# Patient Record
Sex: Female | Born: 1975
Health system: Southern US, Community
[De-identification: ages and names within clinical notes are randomized; demographics above are authoritative.]

## PROBLEM LIST (undated history)

## (undated) DIAGNOSIS — K219 Gastro-esophageal reflux disease without esophagitis: Secondary | ICD-10-CM

## (undated) DIAGNOSIS — F32A Depression, unspecified: Secondary | ICD-10-CM

## (undated) DIAGNOSIS — F419 Anxiety disorder, unspecified: Secondary | ICD-10-CM

## (undated) DIAGNOSIS — I10 Essential (primary) hypertension: Secondary | ICD-10-CM

## (undated) DIAGNOSIS — D509 Iron deficiency anemia, unspecified: Secondary | ICD-10-CM

## (undated) DIAGNOSIS — R011 Cardiac murmur, unspecified: Secondary | ICD-10-CM

## (undated) DIAGNOSIS — T7840XA Allergy, unspecified, initial encounter: Secondary | ICD-10-CM

## (undated) DIAGNOSIS — F329 Major depressive disorder, single episode, unspecified: Secondary | ICD-10-CM

## (undated) HISTORY — DX: Allergy, unspecified, initial encounter: T78.40XA

## (undated) HISTORY — DX: Iron deficiency anemia, unspecified: D50.9

## (undated) HISTORY — DX: Anxiety disorder, unspecified: F41.9

## (undated) HISTORY — PX: TUBAL LIGATION: SHX77

## (undated) HISTORY — DX: Major depressive disorder, single episode, unspecified: F32.9

## (undated) HISTORY — DX: Depression, unspecified: F32.A

## (undated) HISTORY — DX: Gastro-esophageal reflux disease without esophagitis: K21.9

## (undated) HISTORY — DX: Cardiac murmur, unspecified: R01.1

## (undated) HISTORY — DX: Essential (primary) hypertension: I10

---
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
## 2000-11-11 DIAGNOSIS — O1495 Unspecified pre-eclampsia, complicating the puerperium: Secondary | ICD-10-CM

## 2003-11-12 HISTORY — PX: CHOLECYSTECTOMY: SHX55

## 2005-02-27 ENCOUNTER — Ambulatory Visit: Payer: Self-pay | Admitting: Obstetrics and Gynecology

## 2005-07-11 ENCOUNTER — Ambulatory Visit: Payer: Self-pay | Admitting: Obstetrics and Gynecology

## 2005-08-01 ENCOUNTER — Ambulatory Visit: Payer: Self-pay | Admitting: Obstetrics and Gynecology

## 2005-09-11 ENCOUNTER — Ambulatory Visit: Payer: Self-pay | Admitting: Obstetrics and Gynecology

## 2005-11-14 ENCOUNTER — Ambulatory Visit: Payer: Self-pay | Admitting: Obstetrics and Gynecology

## 2005-11-20 ENCOUNTER — Ambulatory Visit: Payer: Self-pay | Admitting: Obstetrics and Gynecology

## 2005-11-26 ENCOUNTER — Ambulatory Visit: Payer: Self-pay | Admitting: Obstetrics and Gynecology

## 2005-12-20 ENCOUNTER — Observation Stay: Payer: Self-pay | Admitting: Obstetrics and Gynecology

## 2005-12-24 ENCOUNTER — Observation Stay: Payer: Self-pay | Admitting: Obstetrics and Gynecology

## 2005-12-31 ENCOUNTER — Observation Stay: Payer: Self-pay | Admitting: Obstetrics and Gynecology

## 2006-01-08 ENCOUNTER — Ambulatory Visit: Payer: Self-pay | Admitting: Obstetrics and Gynecology

## 2006-01-10 ENCOUNTER — Inpatient Hospital Stay: Payer: Self-pay | Admitting: Obstetrics and Gynecology

## 2006-01-23 ENCOUNTER — Inpatient Hospital Stay: Payer: Self-pay | Admitting: Obstetrics and Gynecology

## 2008-06-11 LAB — CONVERTED CEMR LAB: Pap Smear: NORMAL

## 2009-02-16 ENCOUNTER — Ambulatory Visit: Payer: Self-pay | Admitting: Family Medicine

## 2009-02-16 DIAGNOSIS — I10 Essential (primary) hypertension: Secondary | ICD-10-CM | POA: Insufficient documentation

## 2009-02-16 LAB — CONVERTED CEMR LAB
Bilirubin Urine: NEGATIVE
Epithelial cells, urine: 0 /lpf
RBC / HPF: 0
Specific Gravity, Urine: >=1.03
WBC, UA: 0 cells/hpf

## 2009-02-17 ENCOUNTER — Ambulatory Visit: Payer: Self-pay | Admitting: Family Medicine

## 2009-02-17 LAB — CONVERTED CEMR LAB
AST: 22 units/L (ref 0–37)
BUN: 12 mg/dL (ref 6–23)
Basophils Absolute: 0 10*3/uL (ref 0.0–0.1)
Calcium: 8.5 mg/dL (ref 8.4–10.5)
Cholesterol: 104 mg/dL (ref 0–200)
Creatinine, Ser: 0.7 mg/dL (ref 0.4–1.2)
GFR calc non Af Amer: 101.31 mL/min (ref >60)
Glucose, Bld: 93 mg/dL (ref 70–99)
HCT: 35.2 % — ABNORMAL LOW (ref 36.0–46.0)
HDL: 28.6 mg/dL — ABNORMAL LOW (ref >39.00)
Lymphs Abs: 1.9 10*3/uL (ref 0.7–4.0)
Monocytes Relative: 13.1 % — ABNORMAL HIGH (ref 3.0–12.0)
Neutrophils Relative %: 51.4 % (ref 43.0–77.0)
Platelets: 171 10*3/uL (ref 150.0–400.0)
Potassium: 3.8 meq/L (ref 3.5–5.1)
RDW: 19.1 % — ABNORMAL HIGH (ref 11.5–14.6)
TSH: 1.71 microintl units/mL (ref 0.35–5.50)
Total Bilirubin: 0.5 mg/dL (ref 0.3–1.2)
Triglycerides: 51 mg/dL (ref 0.0–149.0)
VLDL: 10.2 mg/dL (ref 0.0–40.0)

## 2009-02-20 ENCOUNTER — Telehealth: Payer: Self-pay | Admitting: Family Medicine

## 2009-03-10 ENCOUNTER — Ambulatory Visit: Payer: Self-pay | Admitting: Family Medicine

## 2009-03-10 DIAGNOSIS — D509 Iron deficiency anemia, unspecified: Secondary | ICD-10-CM | POA: Insufficient documentation

## 2009-08-14 ENCOUNTER — Telehealth: Payer: Self-pay | Admitting: Family Medicine

## 2010-01-08 ENCOUNTER — Ambulatory Visit: Payer: Self-pay | Admitting: Family Medicine

## 2010-10-06 ENCOUNTER — Ambulatory Visit: Payer: Self-pay

## 2010-10-06 ENCOUNTER — Emergency Department: Payer: Self-pay | Admitting: Emergency Medicine

## 2010-12-11 NOTE — Assessment & Plan Note (Signed)
Summary: ELEVATED BP/DLO   Vital Signs:  Patient profile:   35 year old female Height:      64 inches Weight:      144.2 pounds BMI:     24.84 Temp:     98.5 degrees F oral Pulse rate:   88 / minute Pulse rhythm:   regular BP sitting:   140 / 96  (left arm) Cuff size:   regular  Vitals Entered By: Zenda Alpers CMA Deborra Medina) (January 08, 2010 9:58 AM)  History of Present Illness: Chief complaint elevated b/p  Hypertension History:      She denies headache, chest pain, palpitations, peripheral edema, neurologic problems, syncope, and side effects from treatment.  HAs noted increase in BP over past few weeks 130/90s. Seen at urgent care for strep...150/102 Working fulltime..increase in stress.        Positive major cardiovascular risk factors include hypertension.  Negative major cardiovascular risk factors include female age less than 67 years old and non-tobacco-user status.     Problems Prior to Update: 1)  Anemia-iron Deficiency  (ICD-280.9) 2)  Hypertension  (ICD-401.9)  Current Medications (verified): 1)  Multivitamins   Tabs (Multiple Vitamin) .... Take 1 Tablet By Mouth Once A Day 2)  Lisinopril 40 Mg Tabs (Lisinopril) .... Take 1 Tablet By Mouth Once A Day  Allergies: 1)  ! Sulfa 2)  ! * Stadol  Past History:  Past medical, surgical, family and social histories (including risk factors) reviewed, and no changes noted (except as noted below).  Past Medical History: Reviewed history from 03/10/2009 and no changes required. Hypertension Anemia-iron deficiency  Past Surgical History: Reviewed history from 02/16/2009 and no changes required. Caesarean section Cholecystectomy Tonsillectomy  Family History: Reviewed history from 02/16/2009 and no changes required. father: glioblastoma multiforme, HTN, CAD s/p stent age 18 mother: HTN, depression siblings: healthy MGM: HTN MGF: accident young age PGF: DM, HTN PGM: CVA  Social History: Reviewed history  from 02/16/2009 and no changes required. Occupation: L and D nurse at [employer]  2 children: healthy Married Never Smoked Alcohol use-no Drug use-no Regular exercise-yes, runner, 5 days a week Diet: trying to improve diet, fruits and veggies  Review of Systems General:  Denies fatigue and fever. GI:  Denies abdominal pain. Neuro:  Denies falling down, headaches, numbness, tremors, and weakness.  Physical Exam  General:  Well-developed,well-nourished,in no acute distress; alert,appropriate and cooperative throughout examination Mouth:  Oral mucosa and oropharynx without lesions or exudates.  Teeth in good repair. Neck:  no carotid bruit or thyromegaly no cervical or supraclavicular lymphadenopathy  Lungs:  Normal respiratory effort, chest expands symmetrically. Lungs are clear to auscultation, no crackles or wheezes. Heart:  Normal rate and regular rhythm. S1 and S2 normal without gallop, murmur, click, rub or other extra sounds. Pulses:  R and L posterior tibial pulses are full and equal bilaterally  Extremities:  no edema   Impression & Recommendations:  Problem # 1:  HYPERTENSION (ICD-401.9) Encouraged continued exercise, weight loss, healthy eating habits. Increase lisinopril...BMET 7-10 days after increasing medication. Her updated medication list for this problem includes:    Lisinopril 40 Mg Tabs (Lisinopril) .Marland Kitchen... Take 1 tablet by mouth once a day  Complete Medication List: 1)  Multivitamins Tabs (Multiple vitamin) .... Take 1 tablet by mouth once a day 2)  Lisinopril 40 Mg Tabs (Lisinopril) .... Take 1 tablet by mouth once a day  Hypertension Assessment/Plan:      The patient's hypertensive risk group is category A:  No risk factors and no target organ damage.  Her calculated 10 year risk of coronary heart disease is 3 %.  Today's blood pressure is 140/96.  Her blood pressure goal is < 140/90.  Patient Instructions: 1)  Follow BP at home call if greater than  140/90. Prescriptions: LISINOPRIL 40 MG TABS (LISINOPRIL) Take 1 tablet by mouth once a day  #30 x 11   Entered and Authorized by:   Eliezer Lofts MD   Signed by:   Eliezer Lofts MD on 01/08/2010   Method used:   Electronically to        [facility]       [place], [place]  [address]       Ph: [phone]       Fax: [phone]   RxID:   [id number]   Current Allergies (reviewed today): ! SULFA ! * STADOL

## 2010-12-26 ENCOUNTER — Emergency Department: Payer: Self-pay | Admitting: Unknown Physician Specialty

## 2011-03-05 ENCOUNTER — Encounter: Payer: Self-pay | Admitting: Sports Medicine

## 2011-03-12 ENCOUNTER — Encounter: Payer: Self-pay | Admitting: Sports Medicine

## 2011-05-27 ENCOUNTER — Ambulatory Visit: Payer: Self-pay | Admitting: Sports Medicine

## 2012-02-04 ENCOUNTER — Encounter: Payer: Self-pay | Admitting: Sports Medicine

## 2012-02-10 ENCOUNTER — Encounter: Payer: Self-pay | Admitting: Sports Medicine

## 2012-03-11 ENCOUNTER — Encounter: Payer: Self-pay | Admitting: Sports Medicine

## 2013-03-12 ENCOUNTER — Ambulatory Visit: Payer: Self-pay | Admitting: Internal Medicine

## 2013-05-18 ENCOUNTER — Ambulatory Visit (INDEPENDENT_AMBULATORY_CARE_PROVIDER_SITE_OTHER): Payer: 59 | Admitting: Internal Medicine

## 2013-05-18 ENCOUNTER — Encounter: Payer: Self-pay | Admitting: Internal Medicine

## 2013-05-18 VITALS — BP 150/80 | HR 75 | Temp 98.6°F | Resp 16 | Ht 64.0 in | Wt 133.5 lb

## 2013-05-18 DIAGNOSIS — R011 Cardiac murmur, unspecified: Secondary | ICD-10-CM

## 2013-05-18 DIAGNOSIS — I10 Essential (primary) hypertension: Secondary | ICD-10-CM

## 2013-05-18 DIAGNOSIS — N921 Excessive and frequent menstruation with irregular cycle: Secondary | ICD-10-CM | POA: Insufficient documentation

## 2013-05-18 DIAGNOSIS — R5381 Other malaise: Secondary | ICD-10-CM

## 2013-05-18 DIAGNOSIS — M25569 Pain in unspecified knee: Secondary | ICD-10-CM

## 2013-05-18 DIAGNOSIS — N920 Excessive and frequent menstruation with regular cycle: Secondary | ICD-10-CM

## 2013-05-18 DIAGNOSIS — D649 Anemia, unspecified: Secondary | ICD-10-CM

## 2013-05-18 DIAGNOSIS — F4310 Post-traumatic stress disorder, unspecified: Secondary | ICD-10-CM | POA: Insufficient documentation

## 2013-05-18 DIAGNOSIS — R0989 Other specified symptoms and signs involving the circulatory and respiratory systems: Secondary | ICD-10-CM

## 2013-05-18 DIAGNOSIS — G4710 Hypersomnia, unspecified: Secondary | ICD-10-CM

## 2013-05-18 DIAGNOSIS — R0600 Dyspnea, unspecified: Secondary | ICD-10-CM | POA: Insufficient documentation

## 2013-05-18 DIAGNOSIS — R5383 Other fatigue: Secondary | ICD-10-CM | POA: Insufficient documentation

## 2013-05-18 DIAGNOSIS — R0609 Other forms of dyspnea: Secondary | ICD-10-CM

## 2013-05-18 LAB — TSH: TSH: 1.38 u[IU]/mL (ref 0.35–5.50)

## 2013-05-18 LAB — CBC WITH DIFFERENTIAL/PLATELET
Basophils Absolute: 0 10*3/uL (ref 0.0–0.1)
Basophils Relative: 0 % (ref 0–1)
Eosinophils Absolute: 0.1 10*3/uL (ref 0.0–0.7)
MCH: 30.4 pg (ref 26.0–34.0)
MCHC: 33.6 g/dL (ref 30.0–36.0)
Monocytes Relative: 8 % (ref 3–12)
Neutrophils Relative %: 55 % (ref 43–77)
Platelets: 215 10*3/uL (ref 150–400)
RDW: 13.5 % (ref 11.5–15.5)

## 2013-05-18 LAB — MAGNESIUM: Magnesium: 2 mg/dL (ref 1.5–2.5)

## 2013-05-18 LAB — VITAMIN B12: Vitamin B-12: 738 pg/mL (ref 211–911)

## 2013-05-18 MED ORDER — AMLODIPINE BESYLATE 2.5 MG PO TABS: 2.5000 mg | ORAL_TABLET | Freq: Every day | ORAL | Status: DC

## 2013-05-18 NOTE — Assessment & Plan Note (Signed)
ddx includes worseninganemia, cardiomyopathy secondary to progressive valvular dysfunction vs dilated cardiomyopathy.  Second opinion from Dr Mariah Milling recommended.

## 2013-05-18 NOTE — Assessment & Plan Note (Signed)
Checking TSH , cbc and iron studies,  Will consider ocps.

## 2013-05-18 NOTE — Assessment & Plan Note (Addendum)
Adding amlodipine for loss of control. Considering secondary  causes including osa and FMD
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
## 2013-05-18 NOTE — Assessment & Plan Note (Signed)
History suggesting OA checking muscle enzyme to rule out rhabdo secondary to overuse.

## 2013-05-18 NOTE — Assessment & Plan Note (Signed)
ddx broad.  Sleep study recommended if anemia , hypothyroid and cardiomyopathy are ruled out.

## 2013-05-18 NOTE — Patient Instructions (Addendum)
Please follow up in one month We will send you reslt via Mychart   Referral for cardiac evaluation and sleep study  Trial of amlodipine 2.5 mg daily to add to losartan.  rx sent to pharmacy

## 2013-05-19 LAB — CK TOTAL AND CKMB (NOT AT ARMC)
CK, MB: 4.9 ng/mL — ABNORMAL HIGH (ref 0.3–4.0)
Relative Index: 2.2 (ref 0.0–2.5)
Total CK: 223 U/L — ABNORMAL HIGH (ref 7–177)

## 2013-05-19 LAB — FOLATE RBC: RBC Folate: 1158 ng/mL (ref >=366)

## 2013-05-20 ENCOUNTER — Encounter: Payer: Self-pay | Admitting: Internal Medicine

## 2013-05-25 ENCOUNTER — Telehealth: Payer: Self-pay | Admitting: *Deleted

## 2013-05-25 LAB — ALDOSTERONE + RENIN ACTIVITY W/ RATIO

## 2013-05-25 NOTE — Telephone Encounter (Signed)
[facility] called stating the would a re-draw for the Aldosterone + renin activity w/ ratio, Specimen thawed

## 2013-05-25 NOTE — Telephone Encounter (Signed)
Left message to call office

## 2013-06-01 ENCOUNTER — Ambulatory Visit (INDEPENDENT_AMBULATORY_CARE_PROVIDER_SITE_OTHER): Payer: 59 | Admitting: Cardiovascular Disease

## 2013-06-01 ENCOUNTER — Encounter: Payer: Self-pay | Admitting: Cardiovascular Disease

## 2013-06-01 VITALS — BP 128/82 | HR 66 | Ht 64.0 in | Wt 133.0 lb

## 2013-06-01 DIAGNOSIS — I10 Essential (primary) hypertension: Secondary | ICD-10-CM

## 2013-06-01 DIAGNOSIS — R011 Cardiac murmur, unspecified: Secondary | ICD-10-CM

## 2013-06-01 DIAGNOSIS — R0600 Dyspnea, unspecified: Secondary | ICD-10-CM

## 2013-06-01 DIAGNOSIS — R0989 Other specified symptoms and signs involving the circulatory and respiratory systems: Secondary | ICD-10-CM

## 2013-06-01 NOTE — Assessment & Plan Note (Signed)
I suggested she monitor her blood pressure at home. If blood pressures run low, perhaps we could cut her losartan in half

## 2013-06-01 NOTE — Assessment & Plan Note (Addendum)
Previous lab work and history from  Dr. Darrick Huntsman reviewed .  Echocardiogram pending. Uncertain if cardiac pathology is involved. If echo is unrevealing, and symptoms get worse, could consider treadmill with VO2 max measurements (done in [place]). Recent laboratory normal BNP. No clinical signs of heart failure.

## 2013-06-01 NOTE — Patient Instructions (Addendum)
You are doing well. No medication changes were made.  Monitor your blood pressure If it runs low, call our office   An echocardiogram has been ordered for murmur and shortness of breath We will call you with the results of the echocardigram  Please call us if you have new issues that need to be addressed before your next appt.    '

## 2013-06-01 NOTE — Progress Notes (Signed)
Patient ID: Autumn Martinez, female    DOB: [birth year], 37 y.o.   MRN: [id number]  HPI Comments: Autumn Martinez is a very pleasant 37 year old woman who presents by referral from Dr. Darrick Huntsman for a second opinion regarding her shortness of breath and murmur.  She reports that she has seen cardiology in the past outside this clinic. Murmur was noted dating back to 2011 or before. She's had no prior echocardiogram. She reports that she has had increasing shortness of breath, exercise intolerance over the past year or more. She is a runner, does long distance training runs .  she has tried to decrease her exercise distance and time for symptoms with mild improvement. It has been slowly getting worse. She does report occasional episodes of chest tightness and shortness of breath when supine. This is rare. Also has chronic leg pain in the morning. She states that this could be from long workouts. One of her biggest complaint is fatigue. She is tired during exercise , also tired at work.   He states that amlodipine 2.5 mg daily was added recently for high blood pressure when she saw Dr. Darrick Huntsman she does not check her blood pressure at home.  EKG shows normal sinus rhythm with 66 beats per minute, possible LVH by voltage criteria in limb leads otherwise normal   Outpatient Encounter Prescriptions as of 06/01/2013  Medication Sig Dispense Refill  . amLODipine (NORVASC) 2.5 MG tablet Take 1 tablet (2.5 mg total) by mouth daily.  30 tablet  3  . glucosamine-chondroitin 500-400 MG tablet Take 3 tablets by mouth daily.      Marland Kitchen losartan (COZAAR) 50 MG tablet Take 50 mg by mouth daily.      . Multiple Vitamins-Minerals (MULTIVITAMIN WITH MINERALS) tablet Take 1 tablet by mouth daily.        Review of Systems  Constitutional: Negative.   HENT: Negative.   Eyes: Negative.   Respiratory: Positive for chest tightness and shortness of breath.   Cardiovascular: Positive for chest pain.  Gastrointestinal: Negative.    Musculoskeletal: Negative.   Skin: Negative.   Neurological: Negative.   Psychiatric/Behavioral: Negative.   All other systems reviewed and are negative.    BP 128/82  Pulse 66  Ht 5\' 4"  (1.626 m)  Wt 133 lb (60.328 kg)  BMI 22.82 kg/m2  LMP 05/10/2013  Physical Exam  Nursing note and vitals reviewed. Constitutional: She is oriented to person, place, and time. She appears well-developed and well-nourished.  HENT:  Head: Normocephalic.  Nose: Nose normal.  Mouth/Throat: Oropharynx is clear and moist.  Eyes: Conjunctivae are normal. Pupils are equal, round, and reactive to light.  Neck: Normal range of motion. Neck supple. No JVD present.  Cardiovascular: Normal rate, regular rhythm, S1 normal, S2 normal, normal heart sounds and intact distal pulses.  Exam reveals no gallop and no friction rub.   No murmur heard. Pulmonary/Chest: Effort normal and breath sounds normal. No respiratory distress. She has no wheezes. She has no rales. She exhibits no tenderness.  Abdominal: Soft. Bowel sounds are normal. She exhibits no distension. There is no tenderness.  Musculoskeletal: Normal range of motion. She exhibits no edema and no tenderness.  Lymphadenopathy:    She has no cervical adenopathy.  Neurological: She is alert and oriented to person, place, and time. Coordination normal.  Skin: Skin is warm and dry. No rash noted. No erythema.  Psychiatric: She has a normal mood and affect. Her behavior is normal. Judgment and thought content normal.  Assessment and Plan

## 2013-06-01 NOTE — Assessment & Plan Note (Signed)
Diffuse murmur across her precordium. Difficult to pinpoint based on exam. Echocardiogram has been ordered given her shortness of breath, decreased exercise tolerance.

## 2013-06-08 ENCOUNTER — Other Ambulatory Visit (INDEPENDENT_AMBULATORY_CARE_PROVIDER_SITE_OTHER): Payer: 59

## 2013-06-08 ENCOUNTER — Other Ambulatory Visit: Payer: Self-pay

## 2013-06-08 DIAGNOSIS — R011 Cardiac murmur, unspecified: Secondary | ICD-10-CM

## 2013-06-08 DIAGNOSIS — R0602 Shortness of breath: Secondary | ICD-10-CM

## 2013-06-18 ENCOUNTER — Ambulatory Visit (INDEPENDENT_AMBULATORY_CARE_PROVIDER_SITE_OTHER): Payer: 59 | Admitting: Internal Medicine

## 2013-06-18 ENCOUNTER — Encounter: Payer: Self-pay | Admitting: Internal Medicine

## 2013-06-18 VITALS — BP 112/78 | HR 59 | Temp 98.4°F | Resp 14 | Wt 132.5 lb

## 2013-06-18 DIAGNOSIS — R0609 Other forms of dyspnea: Secondary | ICD-10-CM

## 2013-06-18 DIAGNOSIS — I10 Essential (primary) hypertension: Secondary | ICD-10-CM

## 2013-06-18 DIAGNOSIS — R0600 Dyspnea, unspecified: Secondary | ICD-10-CM

## 2013-06-18 DIAGNOSIS — R5383 Other fatigue: Secondary | ICD-10-CM

## 2013-06-18 DIAGNOSIS — R5381 Other malaise: Secondary | ICD-10-CM

## 2013-06-18 DIAGNOSIS — D509 Iron deficiency anemia, unspecified: Secondary | ICD-10-CM

## 2013-06-18 DIAGNOSIS — R0989 Other specified symptoms and signs involving the circulatory and respiratory systems: Secondary | ICD-10-CM

## 2013-06-18 MED ORDER — LOSARTAN POTASSIUM 50 MG PO TABS: 50.0000 mg | ORAL_TABLET | Freq: Every day | ORAL | Status: DC

## 2013-06-18 MED ORDER — AMLODIPINE BESYLATE 2.5 MG PO TABS: 2.5000 mg | ORAL_TABLET | Freq: Every day | ORAL | Status: DC

## 2013-06-18 NOTE — Progress Notes (Signed)
Patient ID: Autumn Martinez, female   DOB: 1976-08-28, 37 y.o.   MRN: [id number]   Patient Active Problem List   Diagnosis Date Noted  . Heart murmur, systolic 05/18/2013  . Other malaise and fatigue 05/18/2013  . Pain in joint, lower leg 05/18/2013  . Exertional dyspnea 05/18/2013  . Menorrhagia 05/18/2013  . PTSD (post-traumatic stress disorder) 05/18/2013  . ANEMIA-IRON DEFICIENCY 03/10/2009  . HYPERTENSION 02/16/2009    Subjective:  CC:   Chief Complaint  Patient presents with  . Follow-up    HPI:   Autumn Martinez a 37 y.o. female who presents for follow up on multiple symptoms reported on first visit including fatigue, heart murmur,  And hypertension.  In the interim she had ECHO done by Dossie Arbour which was normal except for mild MR and mild LVH.Marland Kitchen  She was also noted to have iron overload from exogenous iron supplementation and this was stopped.  All other labs were normal.  Her symptoms are improving.    Past Medical History  Diagnosis Date  . Allergy   . Depression   . Heart murmur   . Hypertension     Past Surgical History  Procedure Laterality Date  . Cesarean section  2002  . Cesarean section  2007  . Cholecystectomy  2005  . Tonsillectomy and adenoidectomy Bilateral 1980       The following portions of the patient's history were reviewed and updated as appropriate: Allergies, current medications, and problem list.    Review of Systems:  Patient denies headache, fevers, malaise, unintentional weight loss, skin rash, eye pain, sinus congestion and sinus pain, sore throat, dysphagia,  hemoptysis , cough, dyspnea, wheezing, chest pain, palpitations, orthopnea, edema, abdominal pain, nausea, melena, diarrhea, constipation, flank pain, dysuria, hematuria, urinary  Frequency, nocturia, numbness, tingling, seizures,  Focal weakness, Loss of consciousness,  Tremor, insomnia, depression, anxiety, and suicidal ideation.    Me History   Social History  .  Marital Status: Married    Spouse Name: N/A    Number of Children: N/A  . Years of Education: N/A   Occupational History  . Not on file.   Social History Main Topics  . Smoking status: Never Smoker   . Smokeless tobacco: Never Used  . Alcohol Use: No  . Drug Use: No  . Sexually Active: Yes   Other Topics Concern  . Not on file   Social History Narrative  . No narrative on file    Objective:  Filed Vitals:   06/18/13 0921  BP: 112/78  Pulse: 59  Temp: 98.4 F (36.9 C)  Resp: 14     General appearance: alert, cooperative and appears stated age Ears: normal TM's and external ear canals both ears Throat: lips, mucosa, and tongue normal; teeth and gums normal Neck: no adenopathy, no carotid bruit, supple, symmetrical, trachea midline and thyroid not enlarged, symmetric, no tenderness/mass/nodules Back: symmetric, no curvature. ROM normal. No CVA tenderness. Lungs: clear to auscultation bilaterally Heart: regular rate and rhythm, S1, S2 normal, no murmur, click, rub or gallop Abdomen: soft, non-tender; bowel sounds normal; no masses,  no organomegaly Pulses: 2+ and symmetric Skin: Skin color, texture, turgor normal. No rashes or lesions Lymph nodes: Cervical, supraclavicular, and axillary nodes normal.  Assessment and Plan:  Other malaise and fatigue Improved spontaneously. No source for symptoms other than possibly her iron overload which has been addressed.   Exertional dyspnea Her echo was normal. If her symptoms persist Dr. Mariah Milling will continue workup with a  stress test to be done in [place].  ANEMIA-IRON DEFICIENCY Resolved. Patient actually now has iron overload due to iatrogenic oversupplementation. Her iron supplementation has been stopped.  HYPERTENSION Well controlled on current regimen. Renal function stable, no changes today.   Updated Medication List Outpatient Encounter Prescriptions as of 06/18/2013  Medication Sig Dispense Refill  .  amLODipine (NORVASC) 2.5 MG tablet Take 1 tablet (2.5 mg total) by mouth daily.  90 tablet  3  . glucosamine-chondroitin 500-400 MG tablet Take 3 tablets by mouth daily.      Marland Kitchen losartan (COZAAR) 50 MG tablet Take 1 tablet (50 mg total) by mouth daily.  90 tablet  3  . Multiple Vitamins-Minerals (MULTIVITAMIN WITH MINERALS) tablet Take 1 tablet by mouth daily.      . [DISCONTINUED] amLODipine (NORVASC) 2.5 MG tablet Take 1 tablet (2.5 mg total) by mouth daily.  30 tablet  3  . [DISCONTINUED] losartan (COZAAR) 50 MG tablet Take 50 mg by mouth daily.       No facility-administered encounter medications on file as of 06/18/2013.     No orders of the defined types were placed in this encounter.    No Follow-up on file.

## 2013-06-19 ENCOUNTER — Encounter: Payer: Self-pay | Admitting: Internal Medicine

## 2013-06-19 NOTE — Assessment & Plan Note (Signed)
Improved spontaneously. No source for symptoms other than possibly her iron overload which has been addressed.

## 2013-06-19 NOTE — Assessment & Plan Note (Signed)
Her echo was normal. If her symptoms persist Dr. Mariah Milling will continue workup with a stress test to be done in [place].

## 2013-06-19 NOTE — Assessment & Plan Note (Signed)
Well controlled on current regimen. Renal function stable, no changes today. 

## 2013-06-19 NOTE — Assessment & Plan Note (Signed)
Resolved. Patient actually now has iron overload due to iatrogenic oversupplementation. Her iron supplementation has been stopped.

## 2013-07-13 ENCOUNTER — Encounter: Payer: Self-pay | Admitting: Emergency Medicine

## 2013-07-16 ENCOUNTER — Encounter: Payer: Self-pay | Admitting: Internal Medicine

## 2013-09-16 ENCOUNTER — Other Ambulatory Visit: Payer: Self-pay

## 2013-11-28 ENCOUNTER — Emergency Department: Payer: Self-pay | Admitting: Emergency Medicine

## 2013-11-28 LAB — RAPID INFLUENZA A&B ANTIGENS (ARMC ONLY)

## 2013-12-01 ENCOUNTER — Ambulatory Visit (INDEPENDENT_AMBULATORY_CARE_PROVIDER_SITE_OTHER): Payer: 59 | Admitting: Adult Health

## 2013-12-01 ENCOUNTER — Encounter: Payer: Self-pay | Admitting: Adult Health

## 2013-12-01 VITALS — BP 140/90 | HR 71 | Temp 98.2°F | Resp 14 | Wt 135.0 lb

## 2013-12-01 DIAGNOSIS — R05 Cough: Secondary | ICD-10-CM | POA: Insufficient documentation

## 2013-12-01 DIAGNOSIS — R059 Cough, unspecified: Secondary | ICD-10-CM

## 2013-12-01 MED ORDER — GUAIFENESIN-CODEINE 100-10 MG/5ML PO SOLN: 5.0000 mL | Freq: Three times a day (TID) | ORAL | Status: DC | PRN

## 2013-12-01 NOTE — Patient Instructions (Signed)
  Robitussin AC three times a day for severe cough. This will cause sedation because it contains codeine. Do not drive if taking this medication.  During the day you can take Delsym with guaifenesin and dextromethorphan.  Salt water gargles for your throat irritation as well as chloraseptic spray or lozenges.  Try afrin nasal spray every 12 hours for severe nasal stuffiness. Use only for 3 days.  You can also try the nasal strips at bedtime to open up your nasal passages.  Please call if your symptoms fail to improve within 3-4 days or if symptoms worsen.

## 2013-12-01 NOTE — Patient Instructions (Signed)
  Take Robitussin-AC for severe cough. Medication contains codeine and may cause sedation. Do not take if driving.  During the day take Delsym with guaifenesin and dextromethorphan.  For severe nasal stuffiness try afrin every 12 hours for 3 days only.  Try nasal strips to open nasal passages during bedtime.  Gargle with salt water or use chloraseptic spray for throat irritation  Cool mist humidifier may help symptoms  Call if symptoms do not improve within the next 3-4 days or if symptoms worsen.

## 2013-12-01 NOTE — Progress Notes (Signed)
   Subjective:    Patient ID: Autumn Martinez, female    DOB: January 19, 1976, 38 y.o.   MRN: [id number]  HPI  Pt is a 38 y/o female who presents to clinic with cough, sore throat, body discomfort and fever that began on Saturday. No fever reported after Saturday. She has been taking over the counter medication but this is driving her b/p up.   Current Outpatient Prescriptions on File Prior to Visit  Medication Sig Dispense Refill  . amLODipine (NORVASC) 2.5 MG tablet Take 1 tablet (2.5 mg total) by mouth daily.  90 tablet  3  . losartan (COZAAR) 50 MG tablet Take 1 tablet (50 mg total) by mouth daily.  90 tablet  3  . Multiple Vitamins-Minerals (MULTIVITAMIN WITH MINERALS) tablet Take 1 tablet by mouth daily.      Marland Kitchen glucosamine-chondroitin 500-400 MG tablet Take 3 tablets by mouth daily.       No current facility-administered medications on file prior to visit.     Review of Systems  Constitutional: Positive for fever and chills.  HENT: Positive for postnasal drip and sore throat.   Respiratory: Positive for cough.   Cardiovascular: Negative.   Gastrointestinal: Negative.   Neurological: Negative.   Psychiatric/Behavioral: Negative.   All other systems reviewed and are negative.       Objective:   Physical Exam  Constitutional: She is oriented to person, place, and time. She appears well-developed and well-nourished.  Appears acutely ill.  HENT:  Head: Normocephalic and atraumatic.  Right Ear: External ear normal.  Left Ear: External ear normal.  Slight erythema of pharynx  Cardiovascular: Normal rate and regular rhythm.   Pulmonary/Chest: Effort normal and breath sounds normal. No respiratory distress. She has no wheezes. She has no rales.  Abdominal: Soft.  Musculoskeletal: Normal range of motion.  Neurological: She is alert and oriented to person, place, and time.  Skin: Skin is warm and dry.  Psychiatric: She has a normal mood and affect. Her behavior is normal. Judgment  and thought content normal.          Assessment & Plan:

## 2013-12-01 NOTE — Progress Notes (Signed)
Pre-visit discussion using our clinic review tool. No additional management support is needed unless otherwise documented below in the visit note.  

## 2013-12-01 NOTE — Assessment & Plan Note (Signed)
Tested for flu at work and was negative. No fever currently. Cough keeping her up at night. Robitussin AC. See pt instruction for further POC.

## 2014-06-27 ENCOUNTER — Other Ambulatory Visit: Payer: Self-pay | Admitting: Internal Medicine

## 2014-07-22 ENCOUNTER — Other Ambulatory Visit: Payer: Self-pay | Admitting: Internal Medicine

## 2014-08-02 ENCOUNTER — Other Ambulatory Visit: Payer: Self-pay | Admitting: Internal Medicine

## 2014-08-02 ENCOUNTER — Telehealth: Payer: Self-pay | Admitting: Internal Medicine

## 2014-08-02 ENCOUNTER — Encounter: Payer: Self-pay | Admitting: Internal Medicine

## 2014-08-02 DIAGNOSIS — Z79899 Other long term (current) drug therapy: Secondary | ICD-10-CM

## 2014-08-02 DIAGNOSIS — E785 Hyperlipidemia, unspecified: Secondary | ICD-10-CM

## 2014-08-02 MED ORDER — AMLODIPINE BESYLATE 2.5 MG PO TABS: 2.5000 mg | ORAL_TABLET | Freq: Every day | ORAL | Status: DC

## 2014-08-02 MED ORDER — LOSARTAN POTASSIUM 50 MG PO TABS: ORAL_TABLET | ORAL | Status: DC

## 2014-08-02 NOTE — Telephone Encounter (Signed)
Patient notified and lab scheduled.

## 2014-08-02 NOTE — Telephone Encounter (Signed)
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

## 2014-08-02 NOTE — Telephone Encounter (Signed)
Ok to refill,  Refill sent   Please ask patient to get fasting labs ASAP

## 2014-08-08 ENCOUNTER — Other Ambulatory Visit: Payer: Self-pay | Admitting: Internal Medicine

## 2014-08-08 DIAGNOSIS — R5381 Other malaise: Secondary | ICD-10-CM

## 2014-08-08 DIAGNOSIS — R5383 Other fatigue: Principal | ICD-10-CM

## 2014-08-16 ENCOUNTER — Other Ambulatory Visit (INDEPENDENT_AMBULATORY_CARE_PROVIDER_SITE_OTHER): Payer: 59

## 2014-08-16 DIAGNOSIS — Z79899 Other long term (current) drug therapy: Secondary | ICD-10-CM

## 2014-08-16 DIAGNOSIS — R5381 Other malaise: Secondary | ICD-10-CM

## 2014-08-16 DIAGNOSIS — E785 Hyperlipidemia, unspecified: Secondary | ICD-10-CM

## 2014-08-16 DIAGNOSIS — R5383 Other fatigue: Secondary | ICD-10-CM

## 2014-08-16 LAB — CBC WITH DIFFERENTIAL/PLATELET
BASOS PCT: 0.3 % (ref 0.0–3.0)
Basophils Absolute: 0 10*3/uL (ref 0.0–0.1)
EOS PCT: 2.7 % (ref 0.0–5.0)
Eosinophils Absolute: 0.2 10*3/uL (ref 0.0–0.7)
HCT: 37.4 % (ref 36.0–46.0)
HEMOGLOBIN: 12.8 g/dL (ref 12.0–15.0)
Lymphocytes Relative: 38.8 % (ref 12.0–46.0)
Lymphs Abs: 2.6 10*3/uL (ref 0.7–4.0)
MCHC: 34.1 g/dL (ref 30.0–36.0)
MCV: 92.1 fl (ref 78.0–100.0)
MONO ABS: 0.5 10*3/uL (ref 0.1–1.0)
Monocytes Relative: 7.6 % (ref 3.0–12.0)
Neutro Abs: 3.3 10*3/uL (ref 1.4–7.7)
Neutrophils Relative %: 50.6 % (ref 43.0–77.0)
Platelets: 171 10*3/uL (ref 150.0–400.0)
RBC: 4.06 Mil/uL (ref 3.87–5.11)
RDW: 13.2 % (ref 11.5–15.5)
WBC: 6.6 10*3/uL (ref 4.0–10.5)

## 2014-08-16 LAB — COMPREHENSIVE METABOLIC PANEL
ALBUMIN: 3.9 g/dL (ref 3.5–5.2)
ALK PHOS: 44 U/L (ref 39–117)
ALT: 20 U/L (ref 0–35)
AST: 24 U/L (ref 0–37)
BUN: 13 mg/dL (ref 6–23)
CALCIUM: 8.9 mg/dL (ref 8.4–10.5)
CHLORIDE: 105 meq/L (ref 96–112)
CO2: 28 mEq/L (ref 19–32)
Creatinine, Ser: 0.8 mg/dL (ref 0.4–1.2)
GFR: 88.1 mL/min (ref >60.00)
GLUCOSE: 88 mg/dL (ref 70–99)
Potassium: 4.1 mEq/L (ref 3.5–5.1)
Sodium: 138 mEq/L (ref 135–145)
Total Bilirubin: 0.8 mg/dL (ref 0.2–1.2)
Total Protein: 7.1 g/dL (ref 6.0–8.3)

## 2014-08-16 LAB — LIPID PANEL
CHOLESTEROL: 155 mg/dL (ref 0–200)
HDL: 58 mg/dL (ref >39.00)
LDL Cholesterol: 84 mg/dL (ref 0–99)
NonHDL: 97
TRIGLYCERIDES: 66 mg/dL (ref 0.0–149.0)
Total CHOL/HDL Ratio: 3
VLDL: 13.2 mg/dL (ref 0.0–40.0)

## 2014-08-17 LAB — T4 AND TSH
T4, Total: 8.1 ug/dL (ref 4.5–12.0)
TSH: 1.59 u[IU]/mL (ref 0.450–4.500)

## 2014-08-18 ENCOUNTER — Encounter: Payer: Self-pay | Admitting: Internal Medicine

## 2014-09-12 ENCOUNTER — Encounter: Payer: 59 | Admitting: Internal Medicine

## 2014-10-24 ENCOUNTER — Other Ambulatory Visit (HOSPITAL_COMMUNITY)
Admission: RE | Admit: 2014-10-24 | Discharge: 2014-10-24 | Disposition: A | Discharge status: Home / Self Care | Payer: 59 | Source: Ambulatory Visit | Attending: Internal Medicine | Admitting: Internal Medicine

## 2014-10-24 ENCOUNTER — Encounter: Payer: Self-pay | Admitting: Internal Medicine

## 2014-10-24 ENCOUNTER — Ambulatory Visit (INDEPENDENT_AMBULATORY_CARE_PROVIDER_SITE_OTHER): Payer: 59 | Admitting: Internal Medicine

## 2014-10-24 VITALS — BP 126/78 | HR 62 | Temp 98.1°F | Resp 16 | Ht 64.0 in | Wt 131.5 lb

## 2014-10-24 DIAGNOSIS — Z1151 Encounter for screening for human papillomavirus (HPV): Secondary | ICD-10-CM | POA: Diagnosis present

## 2014-10-24 DIAGNOSIS — Z01419 Encounter for gynecological examination (general) (routine) without abnormal findings: Secondary | ICD-10-CM

## 2014-10-24 DIAGNOSIS — Z0000 Encounter for general adult medical examination without abnormal findings: Secondary | ICD-10-CM

## 2014-10-24 DIAGNOSIS — N920 Excessive and frequent menstruation with regular cycle: Secondary | ICD-10-CM

## 2014-10-24 DIAGNOSIS — R011 Cardiac murmur, unspecified: Secondary | ICD-10-CM

## 2014-10-24 DIAGNOSIS — Z111 Encounter for screening for respiratory tuberculosis: Secondary | ICD-10-CM

## 2014-10-24 DIAGNOSIS — Z1159 Encounter for screening for other viral diseases: Secondary | ICD-10-CM

## 2014-10-24 DIAGNOSIS — E559 Vitamin D deficiency, unspecified: Secondary | ICD-10-CM

## 2014-10-24 DIAGNOSIS — Z1239 Encounter for other screening for malignant neoplasm of breast: Secondary | ICD-10-CM

## 2014-10-24 DIAGNOSIS — I10 Essential (primary) hypertension: Secondary | ICD-10-CM

## 2014-10-24 DIAGNOSIS — Z124 Encounter for screening for malignant neoplasm of cervix: Secondary | ICD-10-CM

## 2014-10-24 DIAGNOSIS — Z91018 Allergy to other foods: Secondary | ICD-10-CM

## 2014-10-24 LAB — VITAMIN D 25 HYDROXY (VIT D DEFICIENCY, FRACTURES): VITD: 41.65 ng/mL (ref 30.00–100.00)

## 2014-10-24 MED ORDER — LOSARTAN POTASSIUM 50 MG PO TABS: ORAL_TABLET | ORAL | Status: DC

## 2014-10-24 MED ORDER — AMLODIPINE BESYLATE 2.5 MG PO TABS: 2.5000 mg | ORAL_TABLET | Freq: Every day | ORAL | Status: DC

## 2014-10-24 NOTE — Patient Instructions (Signed)
You had your annual  wellness exam today.  We will repeat your PAP smear in 2019, sooner if needed   We will contact you with the bloodwork results  Health Maintenance Adopting a healthy lifestyle and getting preventive care can go a long way to promote health and wellness. Talk with your health care provider about what schedule of regular examinations is right for you. This is a good chance for you to check in with your provider about disease prevention and staying healthy. In between checkups, there are plenty of things you can do on your own. Experts have done a lot of research about which lifestyle changes and preventive measures are most likely to keep you healthy. Ask your health care provider for more information. WEIGHT AND DIET  Eat a healthy diet  Be sure to include plenty of vegetables, fruits, low-fat dairy products, and lean protein.  Do not eat a lot of foods high in solid fats, added sugars, or salt.  Get regular exercise. This is one of the most important things you can do for your health.  Most adults should exercise for at least 150 minutes each week. The exercise should increase your heart rate and make you sweat (moderate-intensity exercise).  Most adults should also do strengthening exercises at least twice a week. This is in addition to the moderate-intensity exercise.  Maintain a healthy weight  Body mass index (BMI) is a measurement that can be used to identify possible weight problems. It estimates body fat based on height and weight. Your health care provider can help determine your BMI and help you achieve or maintain a healthy weight.  For females 27 years of age and older:   A BMI below 18.5 is considered underweight.  A BMI of 18.5 to 24.9 is normal.  A BMI of 25 to 29.9 is considered overweight.  A BMI of 30 and above is considered obese.  Watch levels of cholesterol and blood lipids  You should start having your blood tested for lipids and  cholesterol at 40 years of age, then have this test every 5 years.  You may need to have your cholesterol levels checked more often if:  Your lipid or cholesterol levels are high.  You are older than 40 years of age.  You are at high risk for heart disease.  CANCER SCREENING   Lung Cancer  Lung cancer screening is recommended for adults 28-70 years old who are at high risk for lung cancer because of a history of smoking.  A yearly low-dose CT scan of the lungs is recommended for people who:  Currently smoke.  Have quit within the past 15 years.  Have at least a 30-pack-year history of smoking. A pack year is smoking an average of one pack of cigarettes a day for 1 year.  Yearly screening should continue until it has been 15 years since you quit.  Yearly screening should stop if you develop a health problem that would prevent you from having lung cancer treatment.  Breast Cancer  Practice breast self-awareness. This means understanding how your breasts normally appear and feel.  It also means doing regular breast self-exams. Let your health care provider know about any changes, no matter how small.  If you are in your 20s or 30s, you should have a clinical breast exam (CBE) by a health care provider every 1-3 years as part of a regular health exam.  If you are 37 or older, have a CBE every year. Also  consider having a breast X-ray (mammogram) every year.  If you have a family history of breast cancer, talk to your health care provider about genetic screening.  If you are at high risk for breast cancer, talk to your health care provider about having an MRI and a mammogram every year.  Breast cancer gene (BRCA) assessment is recommended for women who have family members with BRCA-related cancers. BRCA-related cancers include:  Breast.  Ovarian.  Tubal.  Peritoneal cancers.  Results of the assessment will determine the need for genetic counseling and BRCA1 and BRCA2  testing. Cervical Cancer Routine pelvic examinations to screen for cervical cancer are no longer recommended for nonpregnant women who are considered low risk for cancer of the pelvic organs (ovaries, uterus, and vagina) and who do not have symptoms. A pelvic examination may be necessary if you have symptoms including those associated with pelvic infections. Ask your health care provider if a screening pelvic exam is right for you.   The Pap test is the screening test for cervical cancer for women who are considered at risk.  If you had a hysterectomy for a problem that was not cancer or a condition that could lead to cancer, then you no longer need Pap tests.  If you are older than 65 years, and you have had normal Pap tests for the past 10 years, you no longer need to have Pap tests.  If you have had past treatment for cervical cancer or a condition that could lead to cancer, you need Pap tests and screening for cancer for at least 20 years after your treatment.  If you no longer get a Pap test, assess your risk factors if they change (such as having a new sexual partner). This can affect whether you should start being screened again.  Some women have medical problems that increase their chance of getting cervical cancer. If this is the case for you, your health care provider may recommend more frequent screening and Pap tests.  The human papillomavirus (HPV) test is another test that may be used for cervical cancer screening. The HPV test looks for the virus that can cause cell changes in the cervix. The cells collected during the Pap test can be tested for HPV.  The HPV test can be used to screen women 14 years of age and older. Getting tested for HPV can extend the interval between normal Pap tests from three to five years.  An HPV test also should be used to screen women of any age who have unclear Pap test results.  After 40 years of age, women should have HPV testing as often as Pap  tests.  Colorectal Cancer  This type of cancer can be detected and often prevented.  Routine colorectal cancer screening usually begins at 40 years of age and continues through 40 years of age.  Your health care provider may recommend screening at an earlier age if you have risk factors for colon cancer.  Your health care provider may also recommend using home test kits to check for hidden blood in the stool.  A small camera at the end of a tube can be used to examine your colon directly (sigmoidoscopy or colonoscopy). This is done to check for the earliest forms of colorectal cancer.  Routine screening usually begins at age 89.  Direct examination of the colon should be repeated every 5-10 years through 40 years of age. However, you may need to be screened more often if early forms of  precancerous polyps or small growths are found. Skin Cancer  Check your skin from head to toe regularly.  Tell your health care provider about any new moles or changes in moles, especially if there is a change in a mole's shape or color.  Also tell your health care provider if you have a mole that is larger than the size of a pencil eraser.  Always use sunscreen. Apply sunscreen liberally and repeatedly throughout the day.  Protect yourself by wearing long sleeves, pants, a wide-brimmed hat, and sunglasses whenever you are outside. HEART DISEASE, DIABETES, AND HIGH BLOOD PRESSURE   Have your blood pressure checked at least every 1-2 years. High blood pressure causes heart disease and increases the risk of stroke.  If you are between 53 years and 72 years old, ask your health care provider if you should take aspirin to prevent strokes.  Have regular diabetes screenings. This involves taking a blood sample to check your fasting blood sugar level.  If you are at a normal weight and have a low risk for diabetes, have this test once every three years after 40 years of age.  If you are overweight and  have a high risk for diabetes, consider being tested at a younger age or more often. PREVENTING INFECTION  Hepatitis B  If you have a higher risk for hepatitis B, you should be screened for this virus. You are considered at high risk for hepatitis B if:  You were born in a country where hepatitis B is common. Ask your health care provider which countries are considered high risk.  Your parents were born in a high-risk country, and you have not been immunized against hepatitis B (hepatitis B vaccine).  You have HIV or AIDS.  You use needles to inject street drugs.  You live with someone who has hepatitis B.  You have had sex with someone who has hepatitis B.  You get hemodialysis treatment.  You take certain medicines for conditions, including cancer, organ transplantation, and autoimmune conditions. Hepatitis C  Blood testing is recommended for:  Everyone born from [birth year] through 1967.  Anyone with known risk factors for hepatitis C. Sexually transmitted infections (STIs)  You should be screened for sexually transmitted infections (STIs) including gonorrhea and chlamydia if:  You are sexually active and are younger than 40 years of age.  You are older than 40 years of age and your health care provider tells you that you are at risk for this type of infection.  Your sexual activity has changed since you were last screened and you are at an increased risk for chlamydia or gonorrhea. Ask your health care provider if you are at risk.  If you do not have HIV, but are at risk, it may be recommended that you take a prescription medicine daily to prevent HIV infection. This is called pre-exposure prophylaxis (PrEP). You are considered at risk if:  You are sexually active and do not regularly use condoms or know the HIV status of your partner(s).  You take drugs by injection.  You are sexually active with a partner who has HIV. Talk with your health care provider about whether you  are at high risk of being infected with HIV. If you choose to begin PrEP, you should first be tested for HIV. You should then be tested every 3 months for as long as you are taking PrEP.  PREGNANCY   If you are premenopausal and you may become pregnant, ask your health care  provider about preconception counseling.  If you may become pregnant, take 400 to 800 micrograms (mcg) of folic acid every day.  If you want to prevent pregnancy, talk to your health care provider about birth control (contraception). OSTEOPOROSIS AND MENOPAUSE   Osteoporosis is a disease in which the bones lose minerals and strength with aging. This can result in serious bone fractures. Your risk for osteoporosis can be identified using a bone density scan.  If you are 75 years of age or older, or if you are at risk for osteoporosis and fractures, ask your health care provider if you should be screened.  Ask your health care provider whether you should take a calcium or vitamin D supplement to lower your risk for osteoporosis.  Menopause may have certain physical symptoms and risks.  Hormone replacement therapy may reduce some of these symptoms and risks. Talk to your health care provider about whether hormone replacement therapy is right for you.  HOME CARE INSTRUCTIONS   Schedule regular health, dental, and eye exams.  Stay current with your immunizations.   Do not use any tobacco products including cigarettes, chewing tobacco, or electronic cigarettes.  If you are pregnant, do not drink alcohol.  If you are breastfeeding, limit how much and how often you drink alcohol.  Limit alcohol intake to no more than 1 drink per day for nonpregnant women. One drink equals 12 ounces of beer, 5 ounces of wine, or 1 ounces of hard liquor.  Do not use street drugs.  Do not share needles.  Ask your health care provider for help if you need support or information about quitting drugs.  Tell your health care provider if  you often feel depressed.  Tell your health care provider if you have ever been abused or do not feel safe at home. Document Released: 05/13/2011 Document Revised: 03/14/2014 Document Reviewed: 09/29/2013 [facility] Patient Information 2015 [place], [place]. This information is not intended to replace advice given to you by your health care provider. Make sure you discuss any questions you have with your health care provider.

## 2014-10-24 NOTE — Progress Notes (Signed)
Patient ID: Autumn Martinez, female   DOB: [birth year], 38 y.o.   MRN: [id number]   Subjective:     Autumn Martinez is a 38 y.o. female and is here for a comprehensive physical exam. The patient reports problems - as described below:.  History   Social History  . Marital Status: Married    Spouse Name: N/A    Number of Children: N/A  . Years of Education: N/A   Occupational History  . Not on file.   Social History Main Topics  . Smoking status: Never Smoker   . Smokeless tobacco: Never Used  . Alcohol Use: No  . Drug Use: No  . Sexual Activity: Yes   Other Topics Concern  . Not on file   Social History Narrative   Health Maintenance  Topic Date Due  . Samul Dada  04/17/1993  . PAP SMEAR  06/12/2011  . INFLUENZA VACCINE  06/11/2014    The following portions of the patient's history were reviewed and updated as appropriate: allergies, current medications, past family history, past medical history, past social history, past surgical history and problem list.  Review of Systems A comprehensive review of systems was negative.   Objective:   BP 126/78 mmHg  Pulse 62  Temp(Src) 98.1 F (36.7 C) (Oral)  Resp 16  Ht 5\' 4"  (1.626 m)  Wt 131 lb 8 oz (59.648 kg)  BMI 22.56 kg/m2  SpO2 98%  LMP 10/17/2014 (Exact Date)  General Appearance:    Alert, cooperative, no distress, appears stated age  Head:    Normocephalic, without obvious abnormality, atraumatic  Eyes:    PERRL, conjunctiva/corneas clear, EOM's intact, fundi    benign, both eyes  Ears:    Normal TM's and external ear canals, both ears  Nose:   Nares normal, septum midline, mucosa normal, no drainage    or sinus tenderness  Throat:   Lips, mucosa, and tongue normal; teeth and gums normal  Neck:   Supple, symmetrical, trachea midline, no adenopathy;    thyroid:  no enlargement/tenderness/nodules; no carotid   bruit or JVD  Back:     Symmetric, no curvature, ROM normal, no CVA tenderness  Lungs:     Clear to  auscultation bilaterally, respirations unlabored  Chest Wall:    No tenderness or deformity   Heart:    Regular rate and rhythm, S1 and S2 normal, no murmur, rub   or gallop  Breast Exam:    No tenderness, masses, or nipple abnormality  Abdomen:     Soft, non-tender, bowel sounds active all four quadrants,    no masses, no organomegaly  Genitalia:    Pelvic: cervix normal in appearance, external genitalia normal, no adnexal masses or tenderness, no cervical motion tenderness, rectovaginal septum normal, uterus normal size, shape, and consistency and vagina normal without discharge  Extremities:   Extremities normal, atraumatic, no cyanosis or edema  Pulses:   2+ and symmetric all extremities  Skin:   Skin color, texture, turgor normal, no rashes or lesions  Lymph nodes:   Cervical, supraclavicular, and axillary nodes normal  Neurologic:   CNII-XII intact, normal strength, sensation and reflexes    throughout     Assessment and Plan:    Problem List Items Addressed This Visit      Cardiovascular and Mediastinum   Essential hypertension    Well controlled on current regimen. Renal function stable, no changes today.  BP Readings from Last 3 Encounters:  10/24/14 126/78  12/01/13 140/90  06/18/13 112/78    Lab Results  Component Value Date   CREATININE 0.8 08/16/2014   Lab Results  Component Value Date   NA 138 08/16/2014   K 4.1 08/16/2014   CL 105 08/16/2014   CO2 28 08/16/2014       Relevant Medications      amLODIpine (NORVASC)  tablet      losartan (COZAAR) tablet     Other   Menorrhagia    Recurrent, limited to first two days of menstrual periods.  She is not anemia and thyroid  Function is  normal.  Continue oral contraception .  Lab Results  Component Value Date   TSH 1.590 08/16/2014   Lab Results  Component Value Date   WBC 6.6 08/16/2014   HGB 12.8 08/16/2014   HCT 37.4 08/16/2014   MCV 92.1 08/16/2014   PLT 171.0 08/16/2014       Heart murmur,  systolic    ECHO done in 5732 documented  mild MR and PR ,  Per Dr Rockey Situ normal.      Encounter for preventive health examination    Annual wellness  exam was done as well as a comprehensive physical exam and management of acute and chronic conditions .  During the course of the visit the patient was educated and counseled about appropriate screening and preventive services including :  diabetes screening, lipid analysis with projected  10 year  risk for CAD , nutrition counseling, colorectal cancer screening, and recommended immunizations.  Printed recommendations for health maintenance screenings was given.     Pap smear for cervical cancer screening    PAP smear was done today with difficulty due to retroverted cervix    Food allergy    Patient has had 2 isolated episodes of angioedema and facial swelling to an unclear source  Referral to allergy testing in process.     Relevant Orders      Ambulatory referral to Allergy    Other Visit Diagnoses    Vitamin D deficiency    -  Primary    Relevant Orders       Vit D  25 hydroxy (rtn osteoporosis monitoring) (Completed)    Need for hepatitis C screening test        Relevant Orders       Hepatitis C antibody    Screening for tuberculosis        Relevant Orders       Quantiferon tb gold assay    Breast cancer screening        Relevant Orders       MM DIGITAL SCREENING BILATERAL    Screening for cervical cancer        Relevant Orders       Cytology - PAP    Breast screening        Encounter for routine gynecological examination

## 2014-10-24 NOTE — Progress Notes (Signed)
Pre-visit discussion using our clinic review tool. No additional management support is needed unless otherwise documented below in the visit note.  

## 2014-10-25 ENCOUNTER — Encounter: Payer: Self-pay | Admitting: Internal Medicine

## 2014-10-25 DIAGNOSIS — Z0000 Encounter for general adult medical examination without abnormal findings: Secondary | ICD-10-CM | POA: Insufficient documentation

## 2014-10-25 DIAGNOSIS — Z124 Encounter for screening for malignant neoplasm of cervix: Secondary | ICD-10-CM | POA: Insufficient documentation

## 2014-10-25 DIAGNOSIS — Z91018 Allergy to other foods: Secondary | ICD-10-CM | POA: Insufficient documentation

## 2014-10-25 MED ORDER — PREDNISONE (PAK) 10 MG PO TABS: ORAL_TABLET | ORAL | Status: DC

## 2014-10-25 MED ORDER — EPINEPHRINE 0.3 MG/0.3ML IJ SOAJ: 0.3000 mg | Freq: Once | INTRAMUSCULAR | Status: DC

## 2014-10-25 MED ORDER — FAMOTIDINE 40 MG PO TABS: 40.0000 mg | ORAL_TABLET | Freq: Two times a day (BID) | ORAL | Status: DC

## 2014-10-25 NOTE — Assessment & Plan Note (Signed)
ECHO done in 2014 documented  mild MR and PR ,  Per Dr Rockey Situ normal.

## 2014-10-25 NOTE — Assessment & Plan Note (Signed)
Recurrent, limited to first two days of menstrual periods.  She is not anemia and thyroid  Function is  normal.  Continue oral contraception .  Lab Results  Component Value Date   TSH 1.590 08/16/2014   Lab Results  Component Value Date   WBC 6.6 08/16/2014   HGB 12.8 08/16/2014   HCT 37.4 08/16/2014   MCV 92.1 08/16/2014   PLT 171.0 08/16/2014

## 2014-10-25 NOTE — Assessment & Plan Note (Signed)

## 2014-10-25 NOTE — Assessment & Plan Note (Addendum)
Well controlled on current regimen. Renal function stable, no changes today.  BP Readings from Last 3 Encounters:  10/24/14 126/78  12/01/13 140/90  06/18/13 112/78    Lab Results  Component Value Date   CREATININE 0.8 08/16/2014   Lab Results  Component Value Date   NA 138 08/16/2014   K 4.1 08/16/2014   CL 105 08/16/2014   CO2 28 08/16/2014

## 2014-10-25 NOTE — Assessment & Plan Note (Signed)
PAP smear was done today with difficulty due to retroverted cervix 

## 2014-10-25 NOTE — Assessment & Plan Note (Signed)
Patient has had 2 isolated episodes of angioedema and facial swelling to an unclear source  Referral to allergy testing in process.

## 2014-10-26 ENCOUNTER — Encounter: Payer: Self-pay | Admitting: Internal Medicine

## 2014-10-26 LAB — HEPATITIS C ANTIBODY: HCV Ab: NEGATIVE

## 2014-10-26 LAB — QUANTIFERON TB GOLD ASSAY (BLOOD)
INTERFERON GAMMA RELEASE ASSAY: NEGATIVE
Mitogen value: 7.05 IU/mL
QUANTIFERON NIL VALUE: 0.06 [IU]/mL
Quantiferon Tb Ag Minus Nil Value: 0 IU/mL
TB Ag value: 0.04 IU/mL

## 2014-10-26 LAB — CYTOLOGY - PAP

## 2014-11-17 ENCOUNTER — Encounter: Payer: Self-pay | Admitting: *Deleted

## 2014-11-17 NOTE — Telephone Encounter (Signed)
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 Thank.  ----- Message ----- From: Genia Plants Sent: 07/30/2014 6:37 PM EDT To: Marnee Guarneri Subject: Flu Shot Clinic Saturday August 06, 2014  [facility] will hold a [facility] on Saturday, September 26 from 9 am to noon. In order to plan appropriately, we ask you to please call the office to schedule an appointment time during the clinic. Our schedulers are ready to assist you, Monday through Friday, 8 am to 5 pm at [phone]. Thank you for choosing [facility] for your healthcare needs.

## 2014-11-21 ENCOUNTER — Ambulatory Visit: Payer: Self-pay | Admitting: Internal Medicine

## 2014-11-21 LAB — HM MAMMOGRAPHY: HM Mammogram: NEGATIVE

## 2014-11-30 ENCOUNTER — Encounter: Payer: Self-pay | Admitting: Internal Medicine

## 2014-12-01 ENCOUNTER — Other Ambulatory Visit: Payer: Self-pay | Admitting: Internal Medicine

## 2014-12-01 MED ORDER — CLINDAMYCIN PHOS-BENZOYL PEROX 1-5 % EX GEL: Freq: Two times a day (BID) | CUTANEOUS | Status: DC

## 2014-12-09 ENCOUNTER — Encounter: Payer: Self-pay | Admitting: Internal Medicine

## 2015-01-11 ENCOUNTER — Other Ambulatory Visit: Payer: Self-pay | Admitting: Internal Medicine

## 2015-01-11 MED ORDER — CLINDAMYCIN PHOS-BENZOYL PEROX 1-5 % EX GEL: Freq: Two times a day (BID) | CUTANEOUS | Status: DC

## 2015-01-11 NOTE — Telephone Encounter (Signed)
refill authorized and sent

## 2015-01-11 NOTE — Telephone Encounter (Signed)
Ok refill? 

## 2015-02-14 ENCOUNTER — Encounter: Payer: Self-pay | Admitting: *Deleted

## 2015-03-14 ENCOUNTER — Other Ambulatory Visit: Payer: Self-pay | Admitting: Internal Medicine

## 2015-03-14 MED ORDER — CLINDAMYCIN PHOS-BENZOYL PEROX 1-5 % EX GEL: Freq: Two times a day (BID) | CUTANEOUS | Status: DC

## 2015-03-14 NOTE — Telephone Encounter (Signed)
authorized and sent  

## 2015-03-14 NOTE — Telephone Encounter (Signed)
Ok refill? 

## 2015-04-14 ENCOUNTER — Other Ambulatory Visit: Payer: Self-pay | Admitting: Internal Medicine

## 2015-04-17 ENCOUNTER — Other Ambulatory Visit: Payer: Self-pay

## 2015-04-17 MED ORDER — PREDNISONE (PAK) 10 MG PO TABS: ORAL_TABLET | ORAL | Status: DC

## 2015-04-17 MED ORDER — AMLODIPINE BESYLATE 2.5 MG PO TABS: 2.5000 mg | ORAL_TABLET | Freq: Every day | ORAL | Status: DC

## 2015-04-17 MED ORDER — LOSARTAN POTASSIUM 50 MG PO TABS: ORAL_TABLET | ORAL | Status: DC

## 2015-04-17 NOTE — Addendum Note (Signed)
Addended by: Crecencio Mc on: 04/17/2015 12:32 PM   Modules accepted: Orders

## 2015-04-17 NOTE — Telephone Encounter (Signed)
Patient is requesting refills

## 2015-05-17 ENCOUNTER — Encounter: Payer: Self-pay | Admitting: Internal Medicine

## 2015-07-13 ENCOUNTER — Telehealth: Payer: Self-pay | Admitting: *Deleted

## 2015-07-13 NOTE — Telephone Encounter (Signed)
Pt calling stating that she is having some pcs's and her heart fluttering  She get the dizziness  This is been going on a week and today its gotten a little more bad. Would like to know what to do. Please advise.

## 2015-07-13 NOTE — Telephone Encounter (Signed)
Pt has not been seen in over 2 years.  She will need an appt for eval.  Front desk aware, appt made and pt placed on waiting list.

## 2015-07-15 ENCOUNTER — Encounter: Payer: Self-pay | Admitting: Emergency Medicine

## 2015-07-15 ENCOUNTER — Emergency Department
Admission: EM | Admit: 2015-07-15 | Discharge: 2015-07-15 | Disposition: A | Discharge status: Home / Self Care | Payer: 59 | Attending: Emergency Medicine | Admitting: Emergency Medicine

## 2015-07-15 DIAGNOSIS — I493 Ventricular premature depolarization: Secondary | ICD-10-CM | POA: Diagnosis not present

## 2015-07-15 DIAGNOSIS — R011 Cardiac murmur, unspecified: Secondary | ICD-10-CM | POA: Diagnosis not present

## 2015-07-15 DIAGNOSIS — Z79899 Other long term (current) drug therapy: Secondary | ICD-10-CM | POA: Insufficient documentation

## 2015-07-15 DIAGNOSIS — I159 Secondary hypertension, unspecified: Secondary | ICD-10-CM | POA: Diagnosis not present

## 2015-07-15 DIAGNOSIS — I10 Essential (primary) hypertension: Secondary | ICD-10-CM | POA: Diagnosis not present

## 2015-07-15 DIAGNOSIS — R42 Dizziness and giddiness: Secondary | ICD-10-CM | POA: Diagnosis present

## 2015-07-15 LAB — CBC
HCT: 40.5 % (ref 35.0–47.0)
HEMOGLOBIN: 13.7 g/dL (ref 12.0–16.0)
MCH: 30.7 pg (ref 26.0–34.0)
MCHC: 33.7 g/dL (ref 32.0–36.0)
MCV: 91 fL (ref 80.0–100.0)
PLATELETS: 179 10*3/uL (ref 150–440)
RBC: 4.45 MIL/uL (ref 3.80–5.20)
RDW: 12.9 % (ref 11.5–14.5)
WBC: 6.9 10*3/uL (ref 3.6–11.0)

## 2015-07-15 LAB — BASIC METABOLIC PANEL
Anion gap: 7 (ref 5–15)
BUN: 13 mg/dL (ref 6–20)
CALCIUM: 8.9 mg/dL (ref 8.9–10.3)
CO2: 26 mmol/L (ref 22–32)
CREATININE: 0.74 mg/dL (ref 0.44–1.00)
Chloride: 104 mmol/L (ref 101–111)
GFR calc Af Amer: >60 mL/min (ref >60)
GFR calc non Af Amer: >60 mL/min (ref >60)
GLUCOSE: 97 mg/dL (ref 65–99)
Potassium: 3.6 mmol/L (ref 3.5–5.1)
Sodium: 137 mmol/L (ref 135–145)

## 2015-07-15 LAB — MAGNESIUM: MAGNESIUM: 2.3 mg/dL (ref 1.7–2.4)

## 2015-07-15 LAB — TSH: TSH: 1.268 u[IU]/mL (ref 0.350–4.500)

## 2015-07-15 NOTE — ED Notes (Signed)
Patient is resting comfortably, husband at bedside.

## 2015-07-15 NOTE — ED Notes (Signed)
Patient works as Marine scientist in hospital. States she has h/o HTN and Mitral Valve regurgitation. Reports having PVC's since 1100 with dizziness.

## 2015-07-15 NOTE — ED Notes (Signed)
Patient is resting comfortably. 

## 2015-07-15 NOTE — ED Provider Notes (Signed)
[facility] Emergency Department Provider Note REMINDER - THIS NOTE IS NOT A FINAL MEDICAL RECORD UNTIL IT IS SIGNED. UNTIL THEN, THE CONTENT BELOW MAY REFLECT INFORMATION FROM A DOCUMENTATION TEMPLATE, NOT THE ACTUAL PATIENT VISIT. ____________________________________________  Time seen: Approximately 1:32 PM  I have reviewed the triage vital signs and the nursing notes.   HISTORY  Chief Complaint Dizziness and Hypertension    HPI Autumn Martinez is a 39 y.o. female history of hypertension. She reports for about the last week she has noticed occasionally feeling skipped beats which she believes are "PVCs". She was at work today at 11:00 and noticed some ongoing occasional, but still infrequent skipping feeling in her heart and she felt briefly dizzy. She corrected herself to telemetry monitor and to be photo of what is a PVC overheard rhythm strip, which is what she is reportedly feeling. I reviewed with her and indeed this is a PVC.  She has not passed out, she has not a chest pain or trouble breathing. She has a history of mitral valve disease and reports she has very minimal murmur and is seen by Dr. Rockey Situ. She does use caffeine every morning. She is not pregnant and had a previous tubal ligation. She reports feeling no symptoms now.  She does take losartan and amlodipine daily. She otherwise takes no other medications.  Past Medical History  Diagnosis Date  . Allergy   . Depression   . Heart murmur   . Hypertension     Patient Active Problem List   Diagnosis Date Noted  . Encounter for preventive health examination 10/25/2014  . Pap smear for cervical cancer screening 10/25/2014  . Food allergy 10/25/2014  . Heart murmur, systolic 60/73/7106  . Other malaise and fatigue 05/18/2013  . Menorrhagia 05/18/2013  . PTSD (post-traumatic stress disorder) 05/18/2013  . ANEMIA-IRON DEFICIENCY 03/10/2009  . Essential hypertension 02/16/2009    Past  Surgical History  Procedure Laterality Date  . Cesarean section  2002  . Cesarean section  2007  . Cholecystectomy  2005  . Tonsillectomy and adenoidectomy Bilateral 1980    Current Outpatient Rx  Name  Route  Sig  Dispense  Refill  . amLODipine (NORVASC) 2.5 MG tablet   Oral   Take 1 tablet (2.5 mg total) by mouth daily.   90 tablet   1   . clindamycin-benzoyl peroxide (BENZACLIN) gel   Topical   Apply topically 2 (two) times daily.   25 g   5   . EPINEPHrine 0.3 mg/0.3 mL IJ SOAJ injection   Intramuscular   Inject 0.3 mLs (0.3 mg total) into the muscle once.   1 Device   1   . famotidine (PEPCID) 40 MG tablet   Oral   Take 1 tablet (40 mg total) by mouth 2 (two) times daily.   30 tablet   0   . losartan (COZAAR) 50 MG tablet      TAKE ONE TABLET BY MOUTH EVERY DAY   90 tablet   1   . Multiple Vitamins-Minerals (MULTIVITAMIN WITH MINERALS) tablet   Oral   Take 1 tablet by mouth daily.         . predniSONE (STERAPRED UNI-PAK) 10 MG tablet      6 tablets on Day 1 , then reduce by 1 tablet daily until gone   21 tablet   0    patient currently only taking amlodipine and losartan  Allergies Butorphanol tartrate and Sulfonamide derivatives  Family  History  Problem Relation Age of Onset  . Hypertension Mother   . Hypertension Father   . Cancer Father   . Cancer Maternal Aunt   . Mental retardation Maternal Uncle     Social History Social History  Substance Use Topics  . Smoking status: Never Smoker   . Smokeless tobacco: Never Used  . Alcohol Use: No    Review of Systems Constitutional: No fever/chills Eyes: No visual changes. ENT: No sore throat. Cardiovascular: Denies chest pain. Occasional skipping beat. Respiratory: Denies shortness of breath. Gastrointestinal: No abdominal pain.  No nausea, no vomiting.  No diarrhea.  No constipation. Genitourinary: Negative for dysuria. Musculoskeletal: Negative for back pain. Skin: Negative for  rash. Neurological: Negative for headaches, focal weakness or numbness.  She is a regular runner. She denies any long trips or travel. No history of any blood clots. Does not take any estrogens. No leg swelling.  10-point ROS otherwise negative.  ____________________________________________   PHYSICAL EXAM:  VITAL SIGNS: ED Triage Vitals  Enc Vitals Group     BP 07/15/15 1314 171/106 mmHg     Pulse Rate 07/15/15 1309 67     Resp 07/15/15 1309 20     Temp 07/15/15 1309 98.1 F (36.7 C)     Temp Source 07/15/15 1309 Oral     SpO2 07/15/15 1309 100 %     Weight 07/15/15 1309 135 lb (61.236 kg)     Height 07/15/15 1309 5\' 4"  (1.626 m)     Head Cir --      Peak Flow --      Pain Score 07/15/15 1309 0     Pain Loc --      Pain Edu? --      Excl. in [place]? --    Constitutional: Alert and oriented. Well appearing and in no acute distress. Eyes: Conjunctivae are normal. PERRL. EOMI. Head: Atraumatic. Nose: No congestion/rhinnorhea. Mouth/Throat: Mucous membranes are moist.  Oropharynx non-erythematous. Neck: No stridor.   Cardiovascular: Normal rate, regular rhythm. Grossly normal heart sounds except for a slightly prominent S2.  Good peripheral circulation. Respiratory: Normal respiratory effort.  No retractions. Lungs CTAB. Gastrointestinal: Soft and nontender. No distention. No abdominal bruits. No CVA tenderness. Musculoskeletal: No lower extremity tenderness nor edema.  No joint effusions. Neurologic:  Normal speech and language. No gross focal neurologic deficits are appreciated. No gait instability. Skin:  Skin is warm, dry and intact. No rash noted. Psychiatric: Mood and affect are normal. Speech and behavior are normal.  ____________________________________________   LABS (all labs ordered are listed, but only abnormal results are displayed)  Labs Reviewed  BASIC METABOLIC PANEL  CBC  MAGNESIUM  TSH  URINALYSIS COMPLETEWITH MICROSCOPIC ([place])    ____________________________________________  EKG  ED ECG REPORT I, Eldred Lievanos, the attending physician, personally viewed and interpreted this ECG.  Date: 07/15/2015 EKG Time: 1315 Rate: 70 Rhythm: normal sinus rhythm QRS Axis: normal Intervals: normal ST/T Wave abnormalities: normal Conduction Disutrbances: none Narrative Interpretation: unremarkable except for some slight artifact, T-wave inversion in V2 is likely normal  ____________________________________________  RADIOLOGY   ____________________________________________   PROCEDURES  Procedure(s) performed: None  Critical Care performed: No  ____________________________________________   INITIAL IMPRESSION / ASSESSMENT AND PLAN / ED COURSE  Pertinent labs & imaging results that were available during my care of the patient were reviewed by me and considered in my medical decision making (see chart for details).  Patient having occasional palpitations. She was able to capture one  on EKG tracing upstairs and reveals a single PVC. She reports that she has been experiencing several these and they may curve occasionally feel slightly dizzy. She does not describe runs of PVCs or concerns for V. tach. Her exam is extremely reassuring.  We will check basic labs, TSH. She has no symptoms of acute cardiopulmonary ischemia.  Chest her labs be normal, I discussed return precautions and she'll follow-up with cardiology. She and her husband are very agreeable. Questions answered.  ----------------------------------------- 3:44 PM on 07/15/2015 -----------------------------------------  Patient remains in no distress. Reports she feels well. Review of telemetry shows occasional PVCs without runs, no evidence of concerning arrhythmia. Return precautions and follow-up with cardiology discussed the patient is agreeable with plan. ____________________________________________   FINAL CLINICAL IMPRESSION(S) / ED  DIAGNOSES  Final diagnoses:  PVC (premature ventricular contraction)  Secondary hypertension, unspecified      Delman Kitten, MD 07/15/15 1544

## 2015-07-15 NOTE — Discharge Instructions (Signed)
Premature Ventricular Contraction Premature ventricular contraction (PVC) is an irregularity of the heart rhythm involving extra or skipped heartbeats. In some cases, they may occur without obvious cause or heart disease. Other times, they can be caused by an electrolyte change in the blood. These need to be corrected. They can also be seen when there is not enough oxygen going to the heart. A common cause of this is plaque or cholesterol buildup. This buildup decreases the blood supply to the heart. In addition, extra beats may be caused or aggravated by:  Excessive smoking.  Alcohol consumption.  Caffeine.  Certain medications  Some street drugs. SYMPTOMS   The sensation of feeling your heart skipping a beat (palpitations).  In many cases, the person may have no symptoms. SIGNS AND TESTS   A physical examination may show an occasional irregularity, but if the PVC beats do not happen often, they may not be found on physical exam.  Blood pressure is usually normal.  Other tests that may find extra beats of the heart are:  An EKG (electrocardiogram)  A Holter monitor which can monitor your heart over longer periods of time  An Angiogram (study of the heart arteries). TREATMENT  Usually extra heartbeats do not need treatment. The condition is treated only if symptoms are severe or if extra beats are very frequent or are causing problems. An underlying cause, if discovered, may also require treatment.  Treatment may also be needed if there may be a risk for other more serious cardiac arrhythmias.  PREVENTION   Moderation in caffeine, alcohol, and tobacco use may reduce the risk of ectopic heartbeats in some people.  Exercise often helps people who lead a sedentary (inactive) lifestyle. PROGNOSIS  PVC heartbeats are generally harmless and do not need treatment.  RISKS AND COMPLICATIONS   Ventricular tachycardia (occasionally).  There usually are no complications.  Other  arrhythmias (occasionally). SEEK IMMEDIATE MEDICAL CARE IF:   You feel palpitations that are frequent or continual.  You develop chest pain or other problems such as shortness of breath, sweating, or nausea and vomiting.  You become light-headed or faint (pass out).  You get worse or do not improve with treatment. Document Released: 06/14/2004 Document Revised: 01/20/2012 Document Reviewed: 12/25/2007 ExitCare Patient Information 2015 [employer]. This information is not intended to replace advice given to you by your health care provider. Make sure you discuss any questions you have with your health care provider.  

## 2015-07-18 ENCOUNTER — Telehealth: Payer: Self-pay | Admitting: Cardiovascular Disease

## 2015-07-18 ENCOUNTER — Ambulatory Visit (INDEPENDENT_AMBULATORY_CARE_PROVIDER_SITE_OTHER): Payer: 59 | Admitting: Nurse Practitioner

## 2015-07-18 VITALS — BP 142/98 | HR 75 | Temp 98.5°F | Resp 16 | Ht 66.0 in | Wt 139.4 lb

## 2015-07-18 DIAGNOSIS — I493 Ventricular premature depolarization: Secondary | ICD-10-CM

## 2015-07-18 MED ORDER — BUSPIRONE HCL 5 MG PO TABS: 5.0000 mg | ORAL_TABLET | Freq: Two times a day (BID) | ORAL | Status: DC

## 2015-07-18 NOTE — Progress Notes (Signed)
Patient ID: Autumn Martinez, female    DOB: 1976/07/09  Age: 39 y.o. MRN: [id number]  CC: Possible PVC   HPI [name] presents for PVC follow up.   1) ER visit on 07/15/15 for dizziness and palpitations.   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   She has past surgical history that includes Cesarean section (2002); Cesarean section (2007); Cholecystectomy (2005); and Tonsillectomy and adenoidectomy (Bilateral, 1980).   Her family history includes Cancer in her father and maternal aunt; Hypertension in her father and mother; Mental retardation in her maternal uncle.She reports that she has never smoked. She has never used smokeless tobacco. She reports that she does not drink alcohol or use illicit drugs.  Outpatient Prescriptions Prior to Visit  Medication Sig Dispense Refill  . amLODipine (NORVASC) 2.5 MG tablet Take 1 tablet (2.5 mg total) by mouth daily. 90 tablet 1  . clindamycin-benzoyl peroxide (BENZACLIN) gel Apply topically 2 (two) times daily. 25 g 5  . EPINEPHrine 0.3 mg/0.3 mL IJ SOAJ injection Inject 0.3 mLs (0.3 mg total) into the muscle once. 1 Device 1  . famotidine (PEPCID) 40 MG tablet Take 1 tablet (40 mg total) by mouth 2 (two) times daily. 30 tablet 0  . losartan (COZAAR) 50 MG tablet TAKE ONE TABLET BY MOUTH EVERY DAY 90 tablet 1  . Multiple Vitamins-Minerals (MULTIVITAMIN WITH MINERALS) tablet Take 1 tablet by mouth daily.    . predniSONE (STERAPRED UNI-PAK) 10 MG tablet 6 tablets on Day 1 , then reduce by 1 tablet daily  until gone 21 tablet 0   No facility-administered medications prior to visit.    ROS Review of Systems  Constitutional: Negative for fever, chills, diaphoresis and fatigue.  Respiratory: Negative for chest tightness, shortness of breath and wheezing.   Cardiovascular: Negative for chest pain, palpitations and leg swelling.  Gastrointestinal: Negative for nausea, vomiting and diarrhea.  Skin: Negative for rash.  Neurological: Negative for dizziness, weakness, numbness and headaches.  Psychiatric/Behavioral: The patient is nervous/anxious.     Objective:  BP 142/98 mmHg  Pulse 75  Temp(Src) 98.5 F (36.9 C)  Resp 16  Ht 5\' 6"  (1.676 m)  Wt 139 lb 6.4 oz (63.231 kg)  BMI 22.51 kg/m2  SpO2 98%  LMP 06/26/2015 (Approximate)  Physical Exam  Constitutional: She is oriented to person, place, and time. She appears well-developed and well-nourished. No distress.  HENT:  Head: Normocephalic and atraumatic.  Right Ear: External ear normal.  Left Ear: External ear normal.  Cardiovascular: Normal rate, regular rhythm and normal heart sounds.  Exam reveals no gallop and no friction rub.   No murmur heard. Pulmonary/Chest: Effort normal and breath sounds normal. No respiratory distress. She has no wheezes. She has no rales. She exhibits no tenderness.  Neurological: She is alert and oriented to person, place, and time. No cranial nerve deficit. She exhibits normal muscle tone. Coordination normal.  Skin: Skin is warm and dry. No rash noted. She is not diaphoretic.  Psychiatric: She has a normal mood and affect. Her behavior is normal. Judgment and thought content normal.      Assessment & Plan:   Tinzlee was seen today for possible pvc.  Diagnoses and all orders for this visit:  PVC (premature ventricular contraction) -     Cancel: EKG 12-Lead -     24 hour holter monitor; Future  Other orders -     busPIRone (BUSPAR) 5 MG tablet; Take 1 tablet (5 mg total) by mouth 2 (two)  times daily.   I am having Ms. Fresquez start on busPIRone. I am also having her maintain her multivitamin with minerals, EPINEPHrine, famotidine, clindamycin-benzoyl peroxide, amLODipine, losartan, and predniSONE.  Meds ordered this encounter  Medications  . busPIRone (BUSPAR) 5 MG tablet    Sig: Take 1 tablet (5 mg total) by mouth 2 (two) times daily.    Dispense:  60 tablet    Refill:  0    Order Specific Question:  Supervising Provider    Answer:  Crecencio Mc [[id number]]     Follow-up: No Follow-up on file.

## 2015-07-18 NOTE — Telephone Encounter (Signed)
[place] for  New onset PVC's patient wants to see if she can be seen sooner.   Cannot come today and Rockey Situ has nothing any sooner than 09-19 as already scheduled .  Patient says ED doc wanted to get Holter monitor done.  Can this be ordered so Rockey Situ can give results at future appt? Please call patient.

## 2015-07-18 NOTE — Telephone Encounter (Signed)
She hasn't been seen in our office in over 2 years.  She will need appt for eval.

## 2015-07-18 NOTE — Patient Instructions (Signed)
Someone (our office or Cardiology will call you to set this up).   Follow up in 3-4 weeks.

## 2015-07-18 NOTE — Progress Notes (Signed)
Pre visit review using our clinic review tool, if applicable. No additional management support is needed unless otherwise documented below in the visit note. 

## 2015-07-19 ENCOUNTER — Encounter: Payer: Self-pay | Admitting: Internal Medicine

## 2015-07-19 ENCOUNTER — Ambulatory Visit: Payer: 59 | Admitting: Cardiovascular Disease

## 2015-07-19 NOTE — Telephone Encounter (Signed)
Attempted to schedule patient sooner appt but she is only able to come on Fridays .  Scheduled holter as ordered by pcp and added patient to waitlist.

## 2015-07-20 ENCOUNTER — Encounter: Payer: Self-pay | Admitting: Nurse Practitioner

## 2015-07-20 DIAGNOSIS — I493 Ventricular premature depolarization: Secondary | ICD-10-CM | POA: Insufficient documentation

## 2015-07-20 NOTE — Assessment & Plan Note (Addendum)
Most likely due to stress. Used inbox to talk with Dr. Donivan Scull office staff and they are getting her set up for 24 hr holter monitoring this week then follow up with Dr. Rockey Situ. Order placed. Will try buspar 5 mg twice daily for stress and anxiety. Discussed pros and cons and pt is agreeable to trial.    Will follow up in 1 month

## 2015-07-21 ENCOUNTER — Ambulatory Visit: Payer: 59 | Admitting: Family Medicine

## 2015-07-21 ENCOUNTER — Other Ambulatory Visit: Payer: Self-pay | Admitting: *Deleted

## 2015-07-21 ENCOUNTER — Ambulatory Visit (INDEPENDENT_AMBULATORY_CARE_PROVIDER_SITE_OTHER): Payer: 59

## 2015-07-21 DIAGNOSIS — I493 Ventricular premature depolarization: Secondary | ICD-10-CM

## 2015-07-31 ENCOUNTER — Ambulatory Visit (INDEPENDENT_AMBULATORY_CARE_PROVIDER_SITE_OTHER): Payer: 59 | Admitting: Cardiovascular Disease

## 2015-07-31 ENCOUNTER — Encounter: Payer: Self-pay | Admitting: Cardiovascular Disease

## 2015-07-31 VITALS — BP 120/80 | HR 80 | Ht 64.0 in | Wt 136.2 lb

## 2015-07-31 DIAGNOSIS — R011 Cardiac murmur, unspecified: Secondary | ICD-10-CM

## 2015-07-31 DIAGNOSIS — I10 Essential (primary) hypertension: Secondary | ICD-10-CM | POA: Diagnosis not present

## 2015-07-31 DIAGNOSIS — R42 Dizziness and giddiness: Secondary | ICD-10-CM | POA: Diagnosis not present

## 2015-07-31 DIAGNOSIS — I493 Ventricular premature depolarization: Secondary | ICD-10-CM | POA: Diagnosis not present

## 2015-07-31 MED ORDER — PROPRANOLOL HCL 10 MG PO TABS: 10.0000 mg | ORAL_TABLET | Freq: Three times a day (TID) | ORAL | Status: DC | PRN

## 2015-07-31 MED ORDER — LOSARTAN POTASSIUM 100 MG PO TABS: ORAL_TABLET | ORAL | Status: DC

## 2015-07-31 NOTE — Assessment & Plan Note (Signed)
Rare PVCs seen on Holter monitor We have discussed the various treatment options with her. We have prescribed propranolol 10 mg for her to take as needed when she is symptomatic. She does not want a beta blocker on a daily basis. Symptoms do seem to come and go. Benign in etiology. No structural heart disease based on prior echocardiogram. No other clinical issues concerning for cardiomyopathy. No further workup needed If symptoms get severe, 30 day monitor could be ordered, could try beta blockers on a daily basis, even calcium channel blockers such as diltiazem or verapamil

## 2015-07-31 NOTE — Progress Notes (Signed)
Patient ID: Autumn Martinez, female    DOB: 1976-06-22, 39 y.o.   MRN: [id number]  HPI Comments: Autumn Martinez is a very pleasant 39 year old woman with previous symptoms of shortness of breath and murmur in 2014, normal echocardiogram, who presents for evaluation of PVCs and hypertension.  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 Previous cardiac ICU nurse. Tries to stay active, exercises, goes running frequently typically without any significant symptoms on exertion. Recently has had more problems with palpitations/ectopy. Possibly worse prior to her menstrual cycle and then gets better. Recently was bad, had a 24-hour Holter monitor that showed PVCs, some APCs. Occasionally PVCs would be in a trigeminal pattern. 500 PVCs over a 24-hour period.  She does not want beta blockers on a daily basis Blood pressure elevated recently, she increase losartan up to 100 mg daily with improved blood pressure control  EKG on today's visit shows normal sinus rhythm with rate 80 bpm, no significant ST or T-wave changes    Allergies  Allergen Reactions  . Butorphanol Tartrate   . Sulfonamide Derivatives     Current Outpatient Prescriptions on File Prior to Visit  Medication Sig Dispense Refill  . amLODipine (NORVASC) 2.5 MG tablet Take 1 tablet (2.5 mg total) by mouth daily. 90 tablet 1  . busPIRone (BUSPAR) 5 MG tablet Take 1 tablet (5 mg total) by mouth 2 (two) times daily. (Patient taking differently: Take 5 mg by mouth 2 (two) times daily as needed. ) 60 tablet 0  . clindamycin-benzoyl peroxide (BENZACLIN) gel Apply topically 2 (two) times daily. 25 g 5  . EPINEPHrine 0.3 mg/0.3 mL IJ SOAJ injection Inject 0.3 mLs (0.3 mg total) into the muscle once. (Patient taking differently: Inject 0.3 mg into the muscle once as needed. ) 1 Device 1  . famotidine (PEPCID) 40 MG tablet Take 1 tablet (40 mg total)  by mouth 2 (two) times daily. (Patient taking differently: Take 40 mg by mouth 2 (two) times daily as needed. ) 30 tablet 0  . Multiple Vitamins-Minerals (MULTIVITAMIN WITH MINERALS) tablet Take 1 tablet by mouth daily.    . predniSONE (STERAPRED UNI-PAK) 10 MG tablet 6 tablets on Day 1 , then reduce by 1 tablet daily until gone (Patient taking differently: as needed. 6 tablets on Day 1 , then reduce by 1 tablet daily until gone) 21 tablet 0   No current facility-administered medications on file prior to visit.    Past Medical History  Diagnosis Date  . Allergy   . Depression   . Heart murmur   . Hypertension     Past Surgical History  Procedure Laterality Date  . Cesarean section  2002  . Cesarean section  2007  . Cholecystectomy  2005  . Tonsillectomy and adenoidectomy Bilateral 1980    Social History  reports that she has never smoked. She has never used smokeless tobacco. She reports that she does not drink alcohol or use illicit drugs.  Family History family history includes Cancer in her father and maternal aunt; Hypertension in her father and mother; Mental retardation in her maternal uncle.  Review of Systems  Constitutional: Negative.   Respiratory: Negative.   Cardiovascular: Positive for palpitations.  Gastrointestinal: Negative.   Musculoskeletal: Negative.   Neurological: Negative.   Psychiatric/Behavioral: Negative.   All other systems reviewed and are negative.   BP 120/80 mmHg  Pulse 80  Ht 5'  4" (1.626 m)  Wt 136 lb 4 oz (61.803 kg)  BMI 23.38 kg/m2  LMP 06/26/2015 (Approximate)  Physical Exam  Constitutional: She is oriented to person, place, and time. She appears well-developed and well-nourished.  HENT:  Head: Normocephalic.  Nose: Nose normal.  Mouth/Throat: Oropharynx is clear and moist.  Eyes: Conjunctivae are normal. Pupils are equal, round, and reactive to light.  Neck: Normal range of motion. Neck supple. No JVD present.   Cardiovascular: Normal rate, regular rhythm, S1 normal, S2 normal and intact distal pulses.  Exam reveals no gallop and no friction rub.   Murmur heard.  Systolic murmur is present with a grade of 2/6  Pulmonary/Chest: Effort normal and breath sounds normal. No respiratory distress. She has no wheezes. She has no rales. She exhibits no tenderness.  Abdominal: Soft. Bowel sounds are normal. She exhibits no distension. There is no tenderness.  Musculoskeletal: Normal range of motion. She exhibits no edema or tenderness.  Lymphadenopathy:    She has no cervical adenopathy.  Neurological: She is alert and oriented to person, place, and time. Coordination normal.  Skin: Skin is warm and dry. No rash noted. No erythema.  Psychiatric: She has a normal mood and affect. Her behavior is normal. Judgment and thought content normal.    Assessment and Plan  Nursing note and vitals reviewed.

## 2015-07-31 NOTE — Patient Instructions (Addendum)
You are doing well.  If blood pressure runs low, Ok to hold amlodipine  If palpitations get worse, Take prorpanolol as needed (1 to 2 pills)  We could use diltiazem as needed (30 mg pill) for palpitations   Research Coronary calcium score to look for blockage  Please call us if you have new issues that need to be addressed before your next appt.  Your physician wants you to follow-up in: 12 months.  You will receive a reminder letter in the mail two months in advance. If you don't receive a letter, please call our office to schedule the follow-up appointment.

## 2015-07-31 NOTE — Assessment & Plan Note (Signed)
Likely innocent flow murmur on exam. No significant valve disease noted on echocardiogram Images suggested a trileaflet aortic valve not a bicuspid valve No further workup at this time

## 2015-07-31 NOTE — Assessment & Plan Note (Signed)
Blood pressure doing well on today's visit. In fact may even be able to hold the amlodipine If palpitations get worse, we did discuss holding amlodipine, losartan, starting extended-release diltiazem or verapamil. We would need to do this cautiously as this potentially could cause lower extremity/ankle edema

## 2015-08-18 ENCOUNTER — Ambulatory Visit: Payer: 59 | Admitting: Internal Medicine

## 2015-08-30 ENCOUNTER — Ambulatory Visit: Payer: 59 | Admitting: Internal Medicine

## 2015-08-30 ENCOUNTER — Telehealth: Payer: Self-pay | Admitting: Internal Medicine

## 2015-08-30 NOTE — Telephone Encounter (Signed)
FYI, Pt called about not able to make her appt today she had a death in the family. Appt is still scheduled. Pt did reschedule the appt. Thank You!

## 2015-09-26 ENCOUNTER — Ambulatory Visit (INDEPENDENT_AMBULATORY_CARE_PROVIDER_SITE_OTHER): Payer: 59 | Admitting: Internal Medicine

## 2015-09-26 DIAGNOSIS — R5383 Other fatigue: Secondary | ICD-10-CM

## 2015-09-28 NOTE — Progress Notes (Signed)
Patient cancelled same day of appointment and will be charged a fee for < 24 hours notice cancellation

## 2015-09-28 NOTE — Assessment & Plan Note (Signed)
Patient cancelled same day of appointment and will be charged a fee for < 24 hours notice cancellation

## 2015-10-19 ENCOUNTER — Encounter: Payer: Self-pay | Admitting: Internal Medicine

## 2015-10-19 ENCOUNTER — Other Ambulatory Visit: Payer: Self-pay | Admitting: Internal Medicine

## 2015-10-19 ENCOUNTER — Ambulatory Visit (INDEPENDENT_AMBULATORY_CARE_PROVIDER_SITE_OTHER): Payer: 59 | Admitting: Internal Medicine

## 2015-10-19 VITALS — BP 130/70 | HR 58 | Temp 97.8°F | Resp 12 | Ht 64.0 in | Wt 139.8 lb

## 2015-10-19 DIAGNOSIS — Z1239 Encounter for other screening for malignant neoplasm of breast: Secondary | ICD-10-CM

## 2015-10-19 DIAGNOSIS — Z0000 Encounter for general adult medical examination without abnormal findings: Secondary | ICD-10-CM

## 2015-10-19 DIAGNOSIS — R5383 Other fatigue: Secondary | ICD-10-CM

## 2015-10-19 DIAGNOSIS — E559 Vitamin D deficiency, unspecified: Secondary | ICD-10-CM

## 2015-10-19 DIAGNOSIS — E785 Hyperlipidemia, unspecified: Secondary | ICD-10-CM

## 2015-10-19 DIAGNOSIS — Z1159 Encounter for screening for other viral diseases: Secondary | ICD-10-CM

## 2015-10-19 LAB — COMPREHENSIVE METABOLIC PANEL
ALBUMIN: 4.1 g/dL (ref 3.5–5.2)
ALT: 16 U/L (ref 0–35)
AST: 21 U/L (ref 0–37)
Alkaline Phosphatase: 50 U/L (ref 39–117)
BILIRUBIN TOTAL: 0.5 mg/dL (ref 0.2–1.2)
BUN: 15 mg/dL (ref 6–23)
CALCIUM: 8.7 mg/dL (ref 8.4–10.5)
CHLORIDE: 105 meq/L (ref 96–112)
CO2: 28 mEq/L (ref 19–32)
CREATININE: 0.7 mg/dL (ref 0.40–1.20)
GFR: 97.77 mL/min (ref >60.00)
Glucose, Bld: 83 mg/dL (ref 70–99)
Potassium: 4.4 mEq/L (ref 3.5–5.1)
Sodium: 140 mEq/L (ref 135–145)
Total Protein: 6.5 g/dL (ref 6.0–8.3)

## 2015-10-19 LAB — LIPID PANEL
CHOLESTEROL: 151 mg/dL (ref 0–200)
HDL: 51.9 mg/dL (ref >39.00)
LDL CALC: 81 mg/dL (ref 0–99)
NonHDL: 98.84
TRIGLYCERIDES: 88 mg/dL (ref 0.0–149.0)
Total CHOL/HDL Ratio: 3
VLDL: 17.6 mg/dL (ref 0.0–40.0)

## 2015-10-19 LAB — VITAMIN D 25 HYDROXY (VIT D DEFICIENCY, FRACTURES): VITD: 40.61 ng/mL (ref 30.00–100.00)

## 2015-10-19 MED ORDER — AMLODIPINE BESYLATE 2.5 MG PO TABS: 2.5000 mg | ORAL_TABLET | Freq: Every day | ORAL | Status: DC

## 2015-10-19 NOTE — Patient Instructions (Signed)
Health Maintenance, Female Adopting a healthy lifestyle and getting preventive care can go a long way to promote health and wellness. Talk with your health care provider about what schedule of regular examinations is right for you. This is a good chance for you to check in with your provider about disease prevention and staying healthy. In between checkups, there are plenty of things you can do on your own. Experts have done a lot of research about which lifestyle changes and preventive measures are most likely to keep you healthy. Ask your health care provider for more information. WEIGHT AND DIET  Eat a healthy diet  Be sure to include plenty of vegetables, fruits, low-fat dairy products, and lean protein.  Do not eat a lot of foods high in solid fats, added sugars, or salt.  Get regular exercise. This is one of the most important things you can do for your health.  Most adults should exercise for at least 150 minutes each week. The exercise should increase your heart rate and make you sweat (moderate-intensity exercise).  Most adults should also do strengthening exercises at least twice a week. This is in addition to the moderate-intensity exercise.  Maintain a healthy weight  Body mass index (BMI) is a measurement that can be used to identify possible weight problems. It estimates body fat based on height and weight. Your health care provider can help determine your BMI and help you achieve or maintain a healthy weight.  For females 20 years of age and older:   A BMI below 18.5 is considered underweight.  A BMI of 18.5 to 24.9 is normal.  A BMI of 25 to 29.9 is considered overweight.  A BMI of 30 and above is considered obese.  Watch levels of cholesterol and blood lipids  You should start having your blood tested for lipids and cholesterol at 41 years of age, then have this test every 5 years.  You may need to have your cholesterol levels checked more often if:  Your lipid  or cholesterol levels are high.  You are older than 41 years of age.  You are at high risk for heart disease.  CANCER SCREENING   Lung Cancer  Lung cancer screening is recommended for adults 55-80 years old who are at high risk for lung cancer because of a history of smoking.  A yearly low-dose CT scan of the lungs is recommended for people who:  Currently smoke.  Have quit within the past 15 years.  Have at least a 30-pack-year history of smoking. A pack year is smoking an average of one pack of cigarettes a day for 1 year.  Yearly screening should continue until it has been 15 years since you quit.  Yearly screening should stop if you develop a health problem that would prevent you from having lung cancer treatment.  Breast Cancer  Practice breast self-awareness. This means understanding how your breasts normally appear and feel.  It also means doing regular breast self-exams. Let your health care provider know about any changes, no matter how small.  If you are in your 20s or 30s, you should have a clinical breast exam (CBE) by a health care provider every 1-3 years as part of a regular health exam.  If you are 40 or older, have a CBE every year. Also consider having a breast X-ray (mammogram) every year.  If you have a family history of breast cancer, talk to your health care provider about genetic screening.  If you   are at high risk for breast cancer, talk to your health care provider about having an MRI and a mammogram every year.  Breast cancer gene (BRCA) assessment is recommended for women who have family members with BRCA-related cancers. BRCA-related cancers include:  Breast.  Ovarian.  Tubal.  Peritoneal cancers.  Results of the assessment will determine the need for genetic counseling and BRCA1 and BRCA2 testing. Cervical Cancer Your health care provider may recommend that you be screened regularly for cancer of the pelvic organs (ovaries, uterus, and  vagina). This screening involves a pelvic examination, including checking for microscopic changes to the surface of your cervix (Pap test). You may be encouraged to have this screening done every 3 years, beginning at age 21.  For women ages 30-65, health care providers may recommend pelvic exams and Pap testing every 3 years, or they may recommend the Pap and pelvic exam, combined with testing for human papilloma virus (HPV), every 5 years. Some types of HPV increase your risk of cervical cancer. Testing for HPV may also be done on women of any age with unclear Pap test results.  Other health care providers may not recommend any screening for nonpregnant women who are considered low risk for pelvic cancer and who do not have symptoms. Ask your health care provider if a screening pelvic exam is right for you.  If you have had past treatment for cervical cancer or a condition that could lead to cancer, you need Pap tests and screening for cancer for at least 20 years after your treatment. If Pap tests have been discontinued, your risk factors (such as having a new sexual partner) need to be reassessed to determine if screening should resume. Some women have medical problems that increase the chance of getting cervical cancer. In these cases, your health care provider may recommend more frequent screening and Pap tests. Colorectal Cancer  This type of cancer can be detected and often prevented.  Routine colorectal cancer screening usually begins at 41 years of age and continues through 41 years of age.  Your health care provider may recommend screening at an earlier age if you have risk factors for colon cancer.  Your health care provider may also recommend using home test kits to check for hidden blood in the stool.  A small camera at the end of a tube can be used to examine your colon directly (sigmoidoscopy or colonoscopy). This is done to check for the earliest forms of colorectal  cancer.  Routine screening usually begins at age 50.  Direct examination of the colon should be repeated every 5-10 years through 41 years of age. However, you may need to be screened more often if early forms of precancerous polyps or small growths are found. Skin Cancer  Check your skin from head to toe regularly.  Tell your health care provider about any new moles or changes in moles, especially if there is a change in a mole's shape or color.  Also tell your health care provider if you have a mole that is larger than the size of a pencil eraser.  Always use sunscreen. Apply sunscreen liberally and repeatedly throughout the day.  Protect yourself by wearing long sleeves, pants, a wide-brimmed hat, and sunglasses whenever you are outside. HEART DISEASE, DIABETES, AND HIGH BLOOD PRESSURE   High blood pressure causes heart disease and increases the risk of stroke. High blood pressure is more likely to develop in:  People who have blood pressure in the high end   of the normal range (130-139/85-89 mm Hg).  People who are overweight or obese.  People who are African American.  If you are 38-23 years of age, have your blood pressure checked every 3-5 years. If you are 61 years of age or older, have your blood pressure checked every year. You should have your blood pressure measured twice--once when you are at a hospital or clinic, and once when you are not at a hospital or clinic. Record the average of the two measurements. To check your blood pressure when you are not at a hospital or clinic, you can use:  An automated blood pressure machine at a pharmacy.  A home blood pressure monitor.  If you are between 45 years and 39 years old, ask your health care provider if you should take aspirin to prevent strokes.  Have regular diabetes screenings. This involves taking a blood sample to check your fasting blood sugar level.  If you are at a normal weight and have a low risk for diabetes,  have this test once every three years after 41 years of age.  If you are overweight and have a high risk for diabetes, consider being tested at a younger age or more often. PREVENTING INFECTION  Hepatitis B  If you have a higher risk for hepatitis B, you should be screened for this virus. You are considered at high risk for hepatitis B if:  You were born in a country where hepatitis B is common. Ask your health care provider which countries are considered high risk.  Your parents were born in a high-risk country, and you have not been immunized against hepatitis B (hepatitis B vaccine).  You have HIV or AIDS.  You use needles to inject street drugs.  You live with someone who has hepatitis B.  You have had sex with someone who has hepatitis B.  You get hemodialysis treatment.  You take certain medicines for conditions, including cancer, organ transplantation, and autoimmune conditions. Hepatitis C  Blood testing is recommended for:  Everyone born from [birth year] through 1967.  Anyone with known risk factors for hepatitis C. Sexually transmitted infections (STIs)  You should be screened for sexually transmitted infections (STIs) including gonorrhea and chlamydia if:  You are sexually active and are younger than 41 years of age.  You are older than 41 years of age and your health care provider tells you that you are at risk for this type of infection.  Your sexual activity has changed since you were last screened and you are at an increased risk for chlamydia or gonorrhea. Ask your health care provider if you are at risk.  If you do not have HIV, but are at risk, it may be recommended that you take a prescription medicine daily to prevent HIV infection. This is called pre-exposure prophylaxis (PrEP). You are considered at risk if:  You are sexually active and do not regularly use condoms or know the HIV status of your partner(s).  You take drugs by injection.  You are sexually  active with a partner who has HIV. Talk with your health care provider about whether you are at high risk of being infected with HIV. If you choose to begin PrEP, you should first be tested for HIV. You should then be tested every 3 months for as long as you are taking PrEP.  PREGNANCY   If you are premenopausal and you may become pregnant, ask your health care provider about preconception counseling.  If you may  become pregnant, take 400 to 800 micrograms (mcg) of folic acid every day.  If you want to prevent pregnancy, talk to your health care provider about birth control (contraception). OSTEOPOROSIS AND MENOPAUSE   Osteoporosis is a disease in which the bones lose minerals and strength with aging. This can result in serious bone fractures. Your risk for osteoporosis can be identified using a bone density scan.  If you are 61 years of age or older, or if you are at risk for osteoporosis and fractures, ask your health care provider if you should be screened.  Ask your health care provider whether you should take a calcium or vitamin D supplement to lower your risk for osteoporosis.  Menopause may have certain physical symptoms and risks.  Hormone replacement therapy may reduce some of these symptoms and risks. Talk to your health care provider about whether hormone replacement therapy is right for you.  HOME CARE INSTRUCTIONS   Schedule regular health, dental, and eye exams.  Stay current with your immunizations.   Do not use any tobacco products including cigarettes, chewing tobacco, or electronic cigarettes.  If you are pregnant, do not drink alcohol.  If you are breastfeeding, limit how much and how often you drink alcohol.  Limit alcohol intake to no more than 1 drink per day for nonpregnant women. One drink equals 12 ounces of beer, 5 ounces of wine, or 1 ounces of hard liquor.  Do not use street drugs.  Do not share needles.  Ask your health care provider for help if  you need support or information about quitting drugs.  Tell your health care provider if you often feel depressed.  Tell your health care provider if you have ever been abused or do not feel safe at home.   This information is not intended to replace advice given to you by your health care provider. Make sure you discuss any questions you have with your health care provider.   Document Released: 05/13/2011 Document Revised: 11/18/2014 Document Reviewed: 09/29/2013 Elsevier Interactive Patient Education [employer].

## 2015-10-19 NOTE — Progress Notes (Signed)
Patient ID: Autumn Martinez, female    DOB: 1976-03-10  Age: 39 y.o. MRN: [id number]  The patient is here for annual  nongyn examination and management of other chronic and acute problems.   The risk factors are reflected in the social history.  The roster of all physicians providing medical care to patient - is listed in the Snapshot section of the chart.  Activities of daily living:  The patient is 100% independent in all ADLs: dressing, toileting, feeding as well as independent mobility  Home safety : The patient has smoke detectors in the home. They wear seatbelts.  There are no firearms at home. There is no violence in the home.   There is no risks for hepatitis, STDs or HIV. There is no   history of blood transfusion. They have no travel history to infectious disease endemic areas of the world.  The patient has seen their dentist in the last six month. They have seen their eye doctor in the last year. They admit to slight hearing difficulty with regard to whispered voices and some television programs.  They have deferred audiologic testing in the last year.  They do not  have excessive sun exposure. Discussed the need for sun protection: hats, long sleeves and use of sunscreen if there is significant sun exposure.   Diet: the importance of a healthy diet is discussed. They do have a healthy diet.  The benefits of regular aerobic exercise were discussed. Autumn Martinez walks 4 times per week ,  20 minutes.   Depression screen: there are no signs or vegative symptoms of depression- irritability, change in appetite, anhedonia, sadness/tearfullness.   The following portions of the patient's history were reviewed and updated as appropriate: allergies, current medications, past family history, past medical history,  past surgical history, past social history  and problem list.  Visual acuity was not assessed per patient preference since Autumn Martinez has regular follow up with her ophthalmologist. Hearing and body  mass index were assessed and reviewed.   During the course of the visit the patient was educated and counseled about appropriate screening and preventive services including : fall prevention , diabetes screening, nutrition counseling, colorectal cancer screening, and recommended immunizations.    CC: The primary encounter diagnosis was Breast cancer screening. Diagnoses of Need for hepatitis C screening test, Hyperlipidemia, Vitamin D deficiency, Other fatigue, and Encounter for preventive health examination were also pertinent to this visit.  History Autumn Martinez has a past medical history of Allergy; Depression; Heart murmur; and Hypertension.   Autumn Martinez has past surgical history that includes Cesarean section (2002); Cesarean section (2007); Cholecystectomy (2005); and Tonsillectomy and adenoidectomy (Bilateral, 1980).   Her family history includes Cancer in her father and maternal aunt; Hypertension in her father and mother; Mental retardation in her maternal uncle.Autumn Martinez reports that Autumn Martinez has never smoked. Autumn Martinez has never used smokeless tobacco. Autumn Martinez reports that Autumn Martinez does not drink alcohol or use illicit drugs.  Outpatient Prescriptions Prior to Visit  Medication Sig Dispense Refill  . busPIRone (BUSPAR) 5 MG tablet Take 1 tablet (5 mg total) by mouth 2 (two) times daily. (Patient taking differently: Take 5 mg by mouth 2 (two) times daily as needed. ) 60 tablet 0  . clindamycin-benzoyl peroxide (BENZACLIN) gel Apply topically 2 (two) times daily. 25 g 5  . EPINEPHrine 0.3 mg/0.3 mL IJ SOAJ injection Inject 0.3 mLs (0.3 mg total) into the muscle once. (Patient taking differently: Inject 0.3 mg into the muscle once as needed. ) 1  Device 1  . famotidine (PEPCID) 40 MG tablet Take 1 tablet (40 mg total) by mouth 2 (two) times daily. (Patient taking differently: Take 40 mg by mouth 2 (two) times daily as needed. ) 30 tablet 0  . losartan (COZAAR) 100 MG tablet TAKE ONE TABLET BY MOUTH EVERY DAY 90 tablet 3  .  Multiple Vitamins-Minerals (MULTIVITAMIN WITH MINERALS) tablet Take 1 tablet by mouth daily.    . predniSONE (STERAPRED UNI-PAK) 10 MG tablet 6 tablets on Day 1 , then reduce by 1 tablet daily until gone (Patient taking differently: as needed. 6 tablets on Day 1 , then reduce by 1 tablet daily until gone) 21 tablet 0  . propranolol (INDERAL) 10 MG tablet Take 1 tablet (10 mg total) by mouth 3 (three) times daily as needed. 90 tablet 6  . amLODipine (NORVASC) 2.5 MG tablet Take 1 tablet (2.5 mg total) by mouth daily. 90 tablet 1   No facility-administered medications prior to visit.    Review of Systems   Patient denies headache, fevers, malaise, unintentional weight loss, skin rash, eye pain, sinus congestion and sinus pain, sore throat, dysphagia,  hemoptysis , cough, dyspnea, wheezing, chest pain, palpitations, orthopnea, edema, abdominal pain, nausea, melena, diarrhea, constipation, flank pain, dysuria, hematuria, urinary  Frequency, nocturia, numbness, tingling, seizures,  Focal weakness, Loss of consciousness,  Tremor, insomnia, depression, anxiety, and suicidal ideation.      Objective:  BP 130/70 mmHg  Pulse 58  Temp(Src) 97.8 F (36.6 C) (Oral)  Resp 12  Ht 5\' 4"  (1.626 m)  Wt 139 lb 12 oz (63.39 kg)  BMI 23.98 kg/m2  SpO2 99%  LMP 10/11/2015  Physical Exam   General appearance: alert, cooperative and appears stated age Ears: normal TM's and external ear canals both ears Throat: lips, mucosa, and tongue normal; teeth and gums normal Neck: no adenopathy, no carotid bruit, supple, symmetrical, trachea midline and thyroid not enlarged, symmetric, no tenderness/mass/nodules Back: symmetric, no curvature. ROM normal. No CVA tenderness. Lungs: clear to auscultation bilaterally Heart: regular rate and rhythm, S1, S2 normal, no murmur, click, rub or gallop Abdomen: soft, non-tender; bowel sounds normal; no masses,  no organomegaly Pulses: 2+ and symmetric Skin: Skin color,  texture, turgor normal. No rashes or lesions Lymph nodes: Cervical, supraclavicular, and axillary nodes normal.    Assessment & Plan:   Problem List Items Addressed This Visit    Encounter for preventive health examination    Annual comprehensive preventive exam was done as well as an evaluation and management of chronic conditions .  During the course of the visit the patient was educated and counseled about appropriate screening and preventive services including :  diabetes screening, lipid analysis with projected  10 year  risk for CAD   , nutrition counseling, colorectal cancer screening, and recommended immunizations.  Printed recommendations for health maintenance screenings was given.        Fatigue   Relevant Orders   Comprehensive metabolic panel (Completed)    Other Visit Diagnoses    Breast cancer screening    -  Primary    Relevant Orders    MM DIGITAL SCREENING BILATERAL    Need for hepatitis C screening test        Relevant Orders    Hepatitis C antibody (Completed)    Hyperlipidemia        Relevant Orders    Lipid panel (Completed)    Vitamin D deficiency        Relevant Orders  VITAMIN D 25 Hydroxy (Vit-D Deficiency, Fractures) (Completed)       I am having Ms. Vowell maintain her multivitamin with minerals, EPINEPHrine, famotidine, clindamycin-benzoyl peroxide, predniSONE, busPIRone, losartan, and propranolol.  No orders of the defined types were placed in this encounter.    There are no discontinued medications.  Follow-up: No Follow-up on file.   Crecencio Mc, MD

## 2015-10-19 NOTE — Progress Notes (Signed)
Pre visit review using our clinic review tool, if applicable. No additional management support is needed unless otherwise documented below in the visit note. 

## 2015-10-20 LAB — HEPATITIS C ANTIBODY: HCV AB: NEGATIVE

## 2015-10-22 ENCOUNTER — Encounter: Payer: Self-pay | Admitting: Internal Medicine

## 2015-10-22 NOTE — Assessment & Plan Note (Signed)
Annual comprehensive preventive exam was done as well as an evaluation and management of chronic conditions .  During the course of the visit the patient was educated and counseled about appropriate screening and preventive services including :  diabetes screening, lipid analysis with projected  10 year  risk for CAD , nutrition counseling, colorectal cancer screening, and recommended immunizations.  Printed recommendations for health maintenance screenings was given.   

## 2015-12-21 DIAGNOSIS — R0981 Nasal congestion: Secondary | ICD-10-CM | POA: Diagnosis not present

## 2015-12-21 DIAGNOSIS — R110 Nausea: Secondary | ICD-10-CM | POA: Diagnosis not present

## 2015-12-21 DIAGNOSIS — R6889 Other general symptoms and signs: Secondary | ICD-10-CM | POA: Diagnosis not present

## 2015-12-21 DIAGNOSIS — J3489 Other specified disorders of nose and nasal sinuses: Secondary | ICD-10-CM | POA: Diagnosis not present

## 2015-12-21 DIAGNOSIS — R05 Cough: Secondary | ICD-10-CM | POA: Diagnosis not present

## 2016-01-07 ENCOUNTER — Encounter: Payer: Self-pay | Admitting: Internal Medicine

## 2016-01-08 ENCOUNTER — Telehealth: Payer: Self-pay | Admitting: Internal Medicine

## 2016-01-08 NOTE — Telephone Encounter (Signed)
Pt dropped off confirmation of applicant's declaration form to be filled out by Dr. Derrel Nip. Form is in box.

## 2016-01-09 NOTE — Telephone Encounter (Signed)
IN red folder patient has an appointment for 01/10/16 if needed for form to be completed is a Physician statement of Fitness physically and mentally to carry out privileges at the [facility] hospital Patient last OV was 12/16, patient would like to know if she should keep appointment.

## 2016-01-09 NOTE — Telephone Encounter (Signed)
Patient advised appointment cancelled form in red folder.

## 2016-01-09 NOTE — Telephone Encounter (Signed)
appt not needed !  We could use the time slot!

## 2016-01-10 ENCOUNTER — Ambulatory Visit: Payer: Self-pay | Admitting: Internal Medicine

## 2016-01-10 ENCOUNTER — Other Ambulatory Visit: Payer: Self-pay | Admitting: Internal Medicine

## 2016-01-12 ENCOUNTER — Encounter: Payer: Self-pay | Admitting: Internal Medicine

## 2016-01-23 ENCOUNTER — Encounter: Payer: Self-pay | Admitting: Cardiovascular Disease

## 2016-01-23 ENCOUNTER — Encounter: Payer: Self-pay | Admitting: Internal Medicine

## 2016-01-23 NOTE — Telephone Encounter (Signed)
Would it be ok to schedule for tomorrow at 4.30?

## 2016-01-24 ENCOUNTER — Encounter: Payer: Self-pay | Admitting: Family Medicine

## 2016-01-24 ENCOUNTER — Ambulatory Visit (INDEPENDENT_AMBULATORY_CARE_PROVIDER_SITE_OTHER): Payer: 59 | Admitting: Family Medicine

## 2016-01-24 VITALS — BP 132/90 | HR 72 | Temp 98.3°F | Ht 64.0 in | Wt 138.1 lb

## 2016-01-24 DIAGNOSIS — I10 Essential (primary) hypertension: Secondary | ICD-10-CM | POA: Diagnosis not present

## 2016-01-24 NOTE — Progress Notes (Signed)
Pre visit review using our clinic review tool, if applicable. No additional management support is needed unless otherwise documented below in the visit note. 

## 2016-01-24 NOTE — Progress Notes (Signed)
Subjective:  Patient ID: Autumn Martinez, female    DOB: [birth year]  Age: 40 y.o. MRN: [id number]  CC: Elevated BP  HPI:  40 year old female with a long-standing history of hypertension presents with complaints of elevated blood pressure.  HTN  Patient states that her blood pressures have been elevated recently. She was recently seen for preemployment physical and her blood pressure was 154/103.  Her blood pressures at home over the past few months have been in the Q000111Q systolic as well.  She endorses compliance with amlodipine and losartan.  She is very concerned given the fact that her blood pressure continues to be elevated despite medication. No known exacerbating factor regarding her elevated blood pressure.  She reports associated headaches when her blood pressure is high.  No other complaints at this time.  Patient states that she is concerned that she may have a secondary cause of hypertension and is concerned that she may have renal artery stenosis. She would like to discuss this today.  Social Hx   Social History   Social History  . Marital Status: Married    Spouse Name: N/A  . Number of Children: N/A  . Years of Education: N/A   Social History Main Topics  . Smoking status: Never Smoker   . Smokeless tobacco: Never Used  . Alcohol Use: No  . Drug Use: No  . Sexual Activity: Yes   Other Topics Concern  . None   Social History Narrative   Review of Systems  Constitutional: Negative.   Cardiovascular:       Elevated BP.  Neurological: Positive for headaches.   Objective:  BP 132/90 mmHg  Pulse 72  Temp(Src) 98.3 F (36.8 C) (Oral)  Ht 5\' 4"  (1.626 m)  Wt 138 lb 2 oz (62.653 kg)  BMI 23.70 kg/m2  SpO2 98%  BP/Weight 01/24/2016 10/19/2015 [id number]  Systolic BP [id number]  Diastolic BP 90 70 80  Wt. (Lbs) 138.13 139.75 136.25  BMI 23.7 23.98 23.38   Physical Exam  Constitutional: She is oriented to person, place, and time. She appears  well-developed. No distress.  Eyes: Conjunctivae are normal. No scleral icterus.  Cardiovascular: Normal rate and regular rhythm.   2/6 systolic murmur.  Pulmonary/Chest: Effort normal and breath sounds normal. No respiratory distress. She has no wheezes. She has no rales.  Neurological: She is alert and oriented to person, place, and time.  Psychiatric:  Anxious. Became tearful during the office visit.  Vitals reviewed.  Lab Results  Component Value Date   WBC 6.9 07/15/2015   HGB 13.7 07/15/2015   HCT 40.5 07/15/2015   PLT 179 07/15/2015   GLUCOSE 83 10/19/2015   CHOL 151 10/19/2015   TRIG 88.0 10/19/2015   HDL 51.90 10/19/2015   LDLCALC 81 10/19/2015   ALT 16 10/19/2015   AST 21 10/19/2015   NA 140 10/19/2015   K 4.4 10/19/2015   CL 105 10/19/2015   CREATININE 0.70 10/19/2015   BUN 15 10/19/2015   CO2 28 10/19/2015   TSH 1.268 07/15/2015   Assessment & Plan:   Problem List Items Addressed This Visit    Essential hypertension - Primary    Establish problem, worsening per patient. Her blood pressure is at goal today. We discussed potential secondary causes for hypertension. There is no symptoms/signs that she has Cushing's, coarctation of aorta, pheochromocytoma, kidney disease, primary hyperaldosteronism. She is concerned that she may have renal artery stenosis. I informed her that this is unlikely  given the fact that she's been on losartan for years and did not have a bump in her creatinine with therapy. I discussed obtaining ultrasound with her and she became tearful. She stated that this was due to her young age and the fact that she is anxious about this. Will proceed with renal artery ultrasound to rule out renal artery stenosis. I also offered change in her medication and she declined. Additionally, I offered ambulatory blood pressure monitoring and she declined. Patient is to continue her losartan and amlodipine.      Relevant Orders   US Renal Artery Stenosis      Follow-up: Closely with [place]

## 2016-01-24 NOTE — Assessment & Plan Note (Addendum)
Establish problem, worsening per patient. Her blood pressure is at goal today. We discussed potential secondary causes for hypertension. There is no symptoms/signs that she has Cushing's, coarctation of aorta, pheochromocytoma, kidney disease, primary hyperaldosteronism. She is concerned that she may have renal artery stenosis. I informed her that this is unlikely given the fact that she's been on losartan for years and did not have a bump in her creatinine with therapy. I discussed obtaining ultrasound with her and she became tearful. She stated that this was due to her young age and the fact that she is anxious about this. Will proceed with renal artery ultrasound to rule out renal artery stenosis. I also offered change in her medication and she declined. Additionally, I offered ambulatory blood pressure monitoring and she declined. Patient is to continue her losartan and amlodipine.

## 2016-01-24 NOTE — Patient Instructions (Signed)
Continue your current medications.  If your BP is consistently elevated >140/90 please let your PCP or Dr. Rockey Situ know.  We will call with the appt regarding your [place].  Take care  Dr. Lacinda Axon

## 2016-01-25 ENCOUNTER — Other Ambulatory Visit: Payer: Self-pay | Admitting: Internal Medicine

## 2016-01-28 ENCOUNTER — Other Ambulatory Visit: Payer: Self-pay | Admitting: Internal Medicine

## 2016-01-28 MED ORDER — FUROSEMIDE 20 MG PO TABS: 20.0000 mg | ORAL_TABLET | Freq: Every day | ORAL | Status: DC

## 2016-02-01 ENCOUNTER — Ambulatory Visit: Payer: Self-pay | Admitting: Internal Medicine

## 2016-03-30 ENCOUNTER — Other Ambulatory Visit: Payer: Self-pay | Admitting: Nurse Practitioner

## 2016-04-01 ENCOUNTER — Telehealth: Payer: Self-pay

## 2016-04-01 MED ORDER — AMLODIPINE BESYLATE 2.5 MG PO TABS: 2.5000 mg | ORAL_TABLET | Freq: Every day | ORAL | Status: DC

## 2016-04-01 MED ORDER — BUSPIRONE HCL 5 MG PO TABS: 5.0000 mg | ORAL_TABLET | Freq: Two times a day (BID) | ORAL | Status: DC | PRN

## 2016-04-01 MED ORDER — LOSARTAN POTASSIUM 100 MG PO TABS: ORAL_TABLET | ORAL | Status: DC

## 2016-04-01 MED ORDER — CLINDAMYCIN PHOS-BENZOYL PEROX 1-5 % EX GEL: CUTANEOUS | Status: DC

## 2016-04-01 NOTE — Telephone Encounter (Signed)
90 day refills on all,  sent to [facility]

## 2016-04-01 NOTE — Telephone Encounter (Signed)
Patient request buspar refill.  Along with Clindabenzogel, Losartan, and Amlodipine.  Please advise.

## 2016-04-02 NOTE — Telephone Encounter (Signed)
Refill sent.

## 2016-04-24 ENCOUNTER — Other Ambulatory Visit: Payer: Self-pay | Admitting: *Deleted

## 2016-04-24 MED ORDER — FUROSEMIDE 20 MG PO TABS: 20.0000 mg | ORAL_TABLET | Freq: Every day | ORAL | Status: DC

## 2016-06-19 ENCOUNTER — Ambulatory Visit (INDEPENDENT_AMBULATORY_CARE_PROVIDER_SITE_OTHER): Payer: Federal, State, Local not specified - PPO | Admitting: Internal Medicine

## 2016-06-19 DIAGNOSIS — F3281 Premenstrual dysphoric disorder: Secondary | ICD-10-CM

## 2016-06-19 MED ORDER — SERTRALINE HCL 50 MG PO TABS: 50.0000 mg | ORAL_TABLET | Freq: Every day | ORAL | 1 refills | Status: DC

## 2016-06-19 NOTE — Progress Notes (Signed)
Pre visit review using our clinic review tool, if applicable. No additional management support is needed unless otherwise documented below in the visit note. 
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
## 2016-06-19 NOTE — Patient Instructions (Signed)
Starting zoloft for PMDD  1/2 tablet daily last 14 days of cycle

## 2016-06-20 DIAGNOSIS — F3281 Premenstrual dysphoric disorder: Secondary | ICD-10-CM | POA: Insufficient documentation

## 2016-06-20 NOTE — Assessment & Plan Note (Signed)
Starting zoloft at 25 mg daily to use for 14 days every month.

## 2016-08-02 ENCOUNTER — Other Ambulatory Visit: Payer: Self-pay | Admitting: Internal Medicine

## 2016-08-13 ENCOUNTER — Other Ambulatory Visit: Payer: Self-pay | Admitting: Internal Medicine

## 2016-08-13 ENCOUNTER — Encounter: Payer: Self-pay | Admitting: Internal Medicine

## 2016-08-13 MED ORDER — SERTRALINE HCL 50 MG PO TABS: 50.0000 mg | ORAL_TABLET | Freq: Every day | ORAL | 1 refills | Status: DC

## 2016-08-14 ENCOUNTER — Other Ambulatory Visit: Payer: Self-pay | Admitting: Internal Medicine

## 2016-08-14 DIAGNOSIS — N920 Excessive and frequent menstruation with regular cycle: Secondary | ICD-10-CM

## 2016-08-14 NOTE — Progress Notes (Signed)
t

## 2016-08-26 ENCOUNTER — Telehealth (INDEPENDENT_AMBULATORY_CARE_PROVIDER_SITE_OTHER): Payer: Self-pay | Admitting: Vascular Surgery

## 2016-08-26 NOTE — Telephone Encounter (Signed)
LMVM for patient to schedule Renal LO Ultrasound from the order that was faxed over but is in EPIC.

## 2016-08-30 ENCOUNTER — Other Ambulatory Visit (INDEPENDENT_AMBULATORY_CARE_PROVIDER_SITE_OTHER): Payer: Federal, State, Local not specified - PPO

## 2016-08-30 DIAGNOSIS — N920 Excessive and frequent menstruation with regular cycle: Secondary | ICD-10-CM | POA: Diagnosis not present

## 2016-08-30 LAB — COMPREHENSIVE METABOLIC PANEL
ALK PHOS: 64 U/L (ref 39–117)
ALT: 16 U/L (ref 0–35)
AST: 22 U/L (ref 0–37)
Albumin: 4.7 g/dL (ref 3.5–5.2)
BILIRUBIN TOTAL: 0.7 mg/dL (ref 0.2–1.2)
BUN: 17 mg/dL (ref 6–23)
CALCIUM: 9.6 mg/dL (ref 8.4–10.5)
CO2: 30 mEq/L (ref 19–32)
Chloride: 102 mEq/L (ref 96–112)
Creatinine, Ser: 0.9 mg/dL (ref 0.40–1.20)
GFR: 72.85 mL/min (ref >60.00)
Glucose, Bld: 85 mg/dL (ref 70–99)
Potassium: 4.3 mEq/L (ref 3.5–5.1)
Sodium: 138 mEq/L (ref 135–145)
TOTAL PROTEIN: 7.7 g/dL (ref 6.0–8.3)

## 2016-08-30 LAB — CBC WITH DIFFERENTIAL/PLATELET
BASOS PCT: 0.3 % (ref 0.0–3.0)
Basophils Absolute: 0 10*3/uL (ref 0.0–0.1)
EOS ABS: 0.2 10*3/uL (ref 0.0–0.7)
Eosinophils Relative: 1.6 % (ref 0.0–5.0)
HCT: 40.1 % (ref 36.0–46.0)
HEMOGLOBIN: 13.6 g/dL (ref 12.0–15.0)
Lymphocytes Relative: 15.4 % (ref 12.0–46.0)
Lymphs Abs: 1.5 10*3/uL (ref 0.7–4.0)
MCHC: 33.8 g/dL (ref 30.0–36.0)
MCV: 89.7 fl (ref 78.0–100.0)
MONO ABS: 0.5 10*3/uL (ref 0.1–1.0)
Monocytes Relative: 4.8 % (ref 3.0–12.0)
NEUTROS ABS: 7.6 10*3/uL (ref 1.4–7.7)
Neutrophils Relative %: 77.9 % — ABNORMAL HIGH (ref 43.0–77.0)
PLATELETS: 247 10*3/uL (ref 150.0–400.0)
RBC: 4.47 Mil/uL (ref 3.87–5.11)
RDW: 13.4 % (ref 11.5–15.5)
WBC: 9.8 10*3/uL (ref 4.0–10.5)

## 2016-08-30 LAB — TSH: TSH: 1.39 u[IU]/mL (ref 0.35–4.50)

## 2016-08-30 LAB — PROTIME-INR
INR: 1 ratio (ref 0.8–1.0)
PROTHROMBIN TIME: 10.6 s (ref 9.6–13.1)

## 2016-08-30 LAB — APTT: aPTT: 29.7 s (ref 23.4–32.7)

## 2016-09-01 ENCOUNTER — Encounter: Payer: Self-pay | Admitting: Internal Medicine

## 2016-09-11 ENCOUNTER — Other Ambulatory Visit: Payer: Self-pay | Admitting: Internal Medicine

## 2016-09-11 DIAGNOSIS — Z1231 Encounter for screening mammogram for malignant neoplasm of breast: Secondary | ICD-10-CM

## 2016-10-06 ENCOUNTER — Other Ambulatory Visit: Payer: Self-pay | Admitting: Internal Medicine

## 2016-10-23 ENCOUNTER — Other Ambulatory Visit: Payer: Self-pay | Admitting: Internal Medicine

## 2016-10-24 ENCOUNTER — Encounter: Payer: Self-pay | Admitting: Internal Medicine

## 2016-11-06 ENCOUNTER — Other Ambulatory Visit: Payer: Self-pay | Admitting: Internal Medicine

## 2016-11-11 HISTORY — PX: BREAST BIOPSY: SHX20

## 2016-11-21 ENCOUNTER — Ambulatory Visit: Payer: Federal, State, Local not specified - PPO

## 2016-12-06 ENCOUNTER — Other Ambulatory Visit: Payer: Self-pay | Admitting: Family Medicine

## 2016-12-06 ENCOUNTER — Ambulatory Visit
Admission: RE | Admit: 2016-12-06 | Discharge: 2016-12-06 | Disposition: A | Discharge status: Home / Self Care | Payer: Federal, State, Local not specified - PPO | Source: Ambulatory Visit | Attending: Internal Medicine | Admitting: Internal Medicine

## 2016-12-06 ENCOUNTER — Encounter: Payer: Self-pay | Admitting: Internal Medicine

## 2016-12-06 ENCOUNTER — Ambulatory Visit (INDEPENDENT_AMBULATORY_CARE_PROVIDER_SITE_OTHER): Payer: Federal, State, Local not specified - PPO

## 2016-12-06 ENCOUNTER — Encounter: Payer: Self-pay | Admitting: Cardiovascular Disease

## 2016-12-06 ENCOUNTER — Ambulatory Visit (INDEPENDENT_AMBULATORY_CARE_PROVIDER_SITE_OTHER): Payer: Federal, State, Local not specified - PPO | Admitting: Cardiovascular Disease

## 2016-12-06 VITALS — BP 130/78 | HR 73 | Ht 64.0 in | Wt 154.5 lb

## 2016-12-06 DIAGNOSIS — R011 Cardiac murmur, unspecified: Secondary | ICD-10-CM | POA: Diagnosis not present

## 2016-12-06 DIAGNOSIS — Z1231 Encounter for screening mammogram for malignant neoplasm of breast: Secondary | ICD-10-CM | POA: Insufficient documentation

## 2016-12-06 DIAGNOSIS — I10 Essential (primary) hypertension: Secondary | ICD-10-CM | POA: Diagnosis not present

## 2016-12-06 DIAGNOSIS — I493 Ventricular premature depolarization: Secondary | ICD-10-CM | POA: Diagnosis not present

## 2016-12-06 DIAGNOSIS — R928 Other abnormal and inconclusive findings on diagnostic imaging of breast: Secondary | ICD-10-CM | POA: Insufficient documentation

## 2016-12-06 NOTE — Progress Notes (Signed)
Cardiology Office Note  Date:  12/06/2016   ID:  TREA CHRISP, DOB 1976-07-23, MRN [id number]  PCP:  Crecencio Mc, MD   Chief Complaint  Patient presents with  . other    12 month follow up. Meds reviewed by the pt. verbally. "doing well."     HPI:  Ms Scarsella is a very pleasant 41 year old woman with previous symptoms of shortness of breath and murmur in 2014, normal echocardiogram, who presents for evaluation of PVCs and hypertension. nurse practitioner, Previous cardiac ICU nurse..  She is currently On three medications for blood pressure Reports that blood pressures well controlled Taking a 6 daily, losartan, amlodipine very low-dose Active, exercises without any significant symptoms on exertion.  Denies having any problems with PVCs She does not require propranolol  Previous 24-hour Holter monitor showed PVCs, some APCs. Occasionally PVCs would be in a trigeminal pattern. 500 PVCs over a 24-hour period.  In Terms of family history Dad with CAD, stents, passed from CA age 32  EKG on today's visit shows normal sinus rhythm with rate 73 bpm, no significant ST or T-wave changes   PMH:   has a past medical history of Allergy; Depression; Heart murmur; and Hypertension.  PSH:    Past Surgical History:  Procedure Laterality Date  . CESAREAN SECTION  2002  . CESAREAN SECTION  2007  . CHOLECYSTECTOMY  2005  . TONSILLECTOMY AND ADENOIDECTOMY Bilateral 1980    Current Outpatient Prescriptions  Medication Sig Dispense Refill  . amLODipine (NORVASC) 2.5 MG tablet TAKE 1 TABLET (2.5 MG TOTAL) BY MOUTH DAILY. 90 tablet 0  . busPIRone (BUSPAR) 5 MG tablet Take 1 tablet (5 mg total) by mouth 2 (two) times daily as needed. 180 tablet 0  . clindamycin-benzoyl peroxide (BENZACLIN) gel APPLY TOPICALLY 2 TIMES DAILY. (Patient taking differently: APPLY TOPICALLY ONCE DAILY) 35 g 1  . EPINEPHrine 0.3 mg/0.3 mL IJ SOAJ injection Inject 0.3 mLs (0.3 mg total) into the muscle  once. (Patient taking differently: Inject 0.3 mg into the muscle once as needed. ) 1 Device 1  . famotidine (PEPCID) 40 MG tablet Take 1 tablet (40 mg total) by mouth 2 (two) times daily. (Patient taking differently: Take 40 mg by mouth 2 (two) times daily as needed. ) 30 tablet 0  . furosemide (LASIX) 20 MG tablet TAKE 1 TABLET (20 MG TOTAL) BY MOUTH DAILY. 90 tablet 3  . losartan (COZAAR) 100 MG tablet TAKE ONE TABLET BY MOUTH EVERY DAY 90 tablet 1  . Multiple Vitamins-Minerals (MULTIVITAMIN WITH MINERALS) tablet Take 1 tablet by mouth daily.    . propranolol (INDERAL) 10 MG tablet Take 1 tablet (10 mg total) by mouth 3 (three) times daily as needed. 90 tablet 6  . sertraline (ZOLOFT) 50 MG tablet Take 1 tablet (50 mg total) by mouth daily. 90 tablet 1   No current facility-administered medications for this visit.      Allergies:   Butorphanol tartrate; Sulfonamide derivatives; Hctz [hydrochlorothiazide]; and Other   Social History:  The patient  reports that she has never smoked. She has never used smokeless tobacco. She reports that she does not drink alcohol or use drugs.   Family History:   family history includes Breast cancer (age of onset: 60) in her maternal aunt; Cancer in her father and maternal aunt; Hypertension in her father and mother; Mental retardation in her maternal uncle.    Review of Systems: Review of Systems  Constitutional: Negative.   Respiratory: Negative.  Cardiovascular: Negative.   Gastrointestinal: Negative.   Musculoskeletal: Negative.   Neurological: Negative.   Psychiatric/Behavioral: Negative.   All other systems reviewed and are negative.    PHYSICAL EXAM: VS:  BP 130/78 (BP Location: Left Arm, Patient Position: Sitting, Cuff Size: Normal)   Pulse 73   Ht 5\' 4"  (1.626 m)   Wt 154 lb 8 oz (70.1 kg)   LMP 11/28/2016   BMI 26.52 kg/m  , BMI Body mass index is 26.52 kg/m. GEN: Well nourished, well developed, in no acute distress  HEENT:  normal  Neck: no JVD, carotid bruits, or masses Cardiac: RRR; no murmurs, rubs, or gallops,no edema  Respiratory:  clear to auscultation bilaterally, normal work of breathing GI: soft, nontender, nondistended, + BS MS: no deformity or atrophy  Skin: warm and dry, no rash Neuro:  Strength and sensation are intact Psych: euthymic mood, full affect    Recent Labs: 08/30/2016: ALT 16; BUN 17; Creatinine, Ser 0.90; Hemoglobin 13.6; Platelets 247.0; Potassium 4.3; Sodium 138; TSH 1.39    Lipid Panel Lab Results  Component Value Date   CHOL 151 10/19/2015   HDL 51.90 10/19/2015   LDLCALC 81 10/19/2015   TRIG 88.0 10/19/2015      Wt Readings from Last 3 Encounters:  12/06/16 154 lb 8 oz (70.1 kg)  06/19/16 143 lb (64.9 kg)  01/24/16 138 lb 2 oz (62.7 kg)       ASSESSMENT AND PLAN:  PVC's (premature ventricular contractions) - Plan: EKG 12-Lead Not having any symptoms, not requiring beta blocker  Heart murmur, systolic - Plan: EKG [id number] Interestingly echocardiogram several years ago did not show any significant abnormality. No further testing needed, asymptomatic  Essential hypertension Blood pressure well controlled Doing well on her current regimen,  very slow trend up in her weight  Disposition:   F/U  12 months as needed   Orders Placed This Encounter  Procedures  . EKG 12-Lead     Signed, Esmond Plants, M.D., Ph.D. 12/06/2016  [place], [place]

## 2016-12-06 NOTE — Patient Instructions (Signed)
Medication Instructions:   No medication changes made  Labwork:  No new labs needed  Testing/Procedures:  No further testing at this time   I recommend watching educational videos on topics of interest to you at:       [email or web address].com  Enter code: HEARTCARE    Follow-Up: It was a pleasure seeing you in the office today. Please call us if you have new issues that need to be addressed before your next appt.  [phone]  Your physician wants you to follow-up in: 12 months.    If you need a refill on your cardiac medications before your next appointment, please call your pharmacy.

## 2016-12-08 ENCOUNTER — Encounter: Payer: Self-pay | Admitting: Internal Medicine

## 2016-12-10 ENCOUNTER — Other Ambulatory Visit: Payer: Self-pay | Admitting: Internal Medicine

## 2016-12-10 DIAGNOSIS — R928 Other abnormal and inconclusive findings on diagnostic imaging of breast: Secondary | ICD-10-CM

## 2016-12-10 DIAGNOSIS — N6489 Other specified disorders of breast: Secondary | ICD-10-CM

## 2016-12-10 NOTE — Telephone Encounter (Signed)
FYI, I have not seen any orders at this time will be on the look out .

## 2016-12-18 ENCOUNTER — Other Ambulatory Visit: Payer: Self-pay | Admitting: Internal Medicine

## 2016-12-18 ENCOUNTER — Ambulatory Visit
Admission: RE | Admit: 2016-12-18 | Discharge: 2016-12-18 | Disposition: A | Discharge status: Home / Self Care | Payer: Federal, State, Local not specified - PPO | Source: Ambulatory Visit | Attending: Internal Medicine | Admitting: Internal Medicine

## 2016-12-18 DIAGNOSIS — R928 Other abnormal and inconclusive findings on diagnostic imaging of breast: Secondary | ICD-10-CM

## 2016-12-18 DIAGNOSIS — N6489 Other specified disorders of breast: Secondary | ICD-10-CM

## 2016-12-19 ENCOUNTER — Ambulatory Visit
Admission: RE | Admit: 2016-12-19 | Discharge: 2016-12-19 | Disposition: A | Discharge status: Home / Self Care | Payer: Federal, State, Local not specified - PPO | Source: Ambulatory Visit | Attending: Internal Medicine | Admitting: Internal Medicine

## 2016-12-19 ENCOUNTER — Other Ambulatory Visit: Payer: Self-pay | Admitting: Internal Medicine

## 2016-12-19 DIAGNOSIS — R928 Other abnormal and inconclusive findings on diagnostic imaging of breast: Secondary | ICD-10-CM

## 2016-12-19 DIAGNOSIS — N6489 Other specified disorders of breast: Secondary | ICD-10-CM

## 2016-12-20 ENCOUNTER — Encounter: Payer: Self-pay | Admitting: Internal Medicine

## 2016-12-24 ENCOUNTER — Ambulatory Visit: Payer: Federal, State, Local not specified - PPO

## 2016-12-24 ENCOUNTER — Other Ambulatory Visit: Payer: Federal, State, Local not specified - PPO

## 2016-12-30 ENCOUNTER — Other Ambulatory Visit: Payer: Self-pay | Admitting: Internal Medicine

## 2017-01-02 ENCOUNTER — Other Ambulatory Visit: Payer: Self-pay | Admitting: Internal Medicine

## 2017-02-16 ENCOUNTER — Other Ambulatory Visit: Payer: Self-pay | Admitting: Internal Medicine

## 2017-02-20 NOTE — Telephone Encounter (Signed)
Refilled: 10/24/2016 Last OV: 06/19/2016 Next OV: not scheduled

## 2017-02-20 NOTE — Telephone Encounter (Signed)
sent 

## 2017-03-30 ENCOUNTER — Other Ambulatory Visit: Payer: Self-pay | Admitting: Internal Medicine

## 2017-05-22 ENCOUNTER — Encounter: Payer: Self-pay | Admitting: Internal Medicine

## 2017-05-29 ENCOUNTER — Telehealth: Payer: Self-pay | Admitting: Internal Medicine

## 2017-05-29 ENCOUNTER — Other Ambulatory Visit: Payer: Self-pay | Admitting: Internal Medicine

## 2017-05-29 DIAGNOSIS — E785 Hyperlipidemia, unspecified: Secondary | ICD-10-CM

## 2017-05-29 DIAGNOSIS — R5383 Other fatigue: Secondary | ICD-10-CM

## 2017-05-29 DIAGNOSIS — I10 Essential (primary) hypertension: Secondary | ICD-10-CM

## 2017-05-29 DIAGNOSIS — D508 Other iron deficiency anemias: Secondary | ICD-10-CM

## 2017-05-29 DIAGNOSIS — Z111 Encounter for screening for respiratory tuberculosis: Secondary | ICD-10-CM

## 2017-05-29 NOTE — Telephone Encounter (Signed)
Needs early morning last visit,  Please call,  Has a quantiferon gold assay needs resulting by aug 5

## 2017-05-29 NOTE — Telephone Encounter (Signed)
Spoke with pt and she stated that she would give [place] a call back tomorrow.

## 2017-05-30 NOTE — Telephone Encounter (Signed)
Pt ed back returning your call. Please advise, thank you!  Call pt @ [phone]

## 2017-05-30 NOTE — Telephone Encounter (Signed)
LMTCB

## 2017-06-03 NOTE — Telephone Encounter (Signed)
Pt has called back and scheduled her appt.

## 2017-06-05 ENCOUNTER — Other Ambulatory Visit (INDEPENDENT_AMBULATORY_CARE_PROVIDER_SITE_OTHER): Payer: Federal, State, Local not specified - PPO

## 2017-06-05 DIAGNOSIS — I10 Essential (primary) hypertension: Secondary | ICD-10-CM | POA: Diagnosis not present

## 2017-06-05 DIAGNOSIS — D508 Other iron deficiency anemias: Secondary | ICD-10-CM | POA: Diagnosis not present

## 2017-06-05 DIAGNOSIS — Z111 Encounter for screening for respiratory tuberculosis: Secondary | ICD-10-CM

## 2017-06-05 DIAGNOSIS — E785 Hyperlipidemia, unspecified: Secondary | ICD-10-CM | POA: Diagnosis not present

## 2017-06-05 LAB — LIPID PANEL
Cholesterol: 163 mg/dL (ref 0–200)
HDL: 55.5 mg/dL (ref >39.00)
LDL Cholesterol: 94 mg/dL (ref 0–99)
NonHDL: 107.95
TRIGLYCERIDES: 71 mg/dL (ref 0.0–149.0)
Total CHOL/HDL Ratio: 3
VLDL: 14.2 mg/dL (ref 0.0–40.0)

## 2017-06-05 LAB — COMPREHENSIVE METABOLIC PANEL
ALK PHOS: 59 U/L (ref 39–117)
ALT: 11 U/L (ref 0–35)
AST: 17 U/L (ref 0–37)
Albumin: 3.9 g/dL (ref 3.5–5.2)
BILIRUBIN TOTAL: 0.7 mg/dL (ref 0.2–1.2)
BUN: 14 mg/dL (ref 6–23)
CALCIUM: 8.9 mg/dL (ref 8.4–10.5)
CO2: 28 mEq/L (ref 19–32)
Chloride: 105 mEq/L (ref 96–112)
Creatinine, Ser: 0.78 mg/dL (ref 0.40–1.20)
GFR: 85.62 mL/min (ref >60.00)
Glucose, Bld: 94 mg/dL (ref 70–99)
Potassium: 3.9 mEq/L (ref 3.5–5.1)
Sodium: 138 mEq/L (ref 135–145)
TOTAL PROTEIN: 6.9 g/dL (ref 6.0–8.3)

## 2017-06-05 LAB — CBC WITH DIFFERENTIAL/PLATELET
Basophils Absolute: 0 10*3/uL (ref 0.0–0.1)
Basophils Relative: 0.3 % (ref 0.0–3.0)
Eosinophils Absolute: 0.1 10*3/uL (ref 0.0–0.7)
Eosinophils Relative: 1.5 % (ref 0.0–5.0)
HCT: 39.6 % (ref 36.0–46.0)
Hemoglobin: 13.1 g/dL (ref 12.0–15.0)
LYMPHS ABS: 2.3 10*3/uL (ref 0.7–4.0)
LYMPHS PCT: 32.2 % (ref 12.0–46.0)
MCHC: 33.1 g/dL (ref 30.0–36.0)
MCV: 91.5 fl (ref 78.0–100.0)
MONOS PCT: 6.6 % (ref 3.0–12.0)
Monocytes Absolute: 0.5 10*3/uL (ref 0.1–1.0)
NEUTROS PCT: 59.4 % (ref 43.0–77.0)
Neutro Abs: 4.2 10*3/uL (ref 1.4–7.7)
Platelets: 224 10*3/uL (ref 150.0–400.0)
RBC: 4.33 Mil/uL (ref 3.87–5.11)
RDW: 13.1 % (ref 11.5–15.5)
WBC: 7.1 10*3/uL (ref 4.0–10.5)

## 2017-06-07 ENCOUNTER — Encounter: Payer: Self-pay | Admitting: Internal Medicine

## 2017-06-07 LAB — QUANTIFERON TB GOLD ASSAY (BLOOD)
INTERFERON GAMMA RELEASE ASSAY: NEGATIVE
QUANTIFERON NIL VALUE: 0.11 [IU]/mL

## 2017-06-08 ENCOUNTER — Encounter: Payer: Self-pay | Admitting: Internal Medicine

## 2017-06-22 ENCOUNTER — Other Ambulatory Visit: Payer: Self-pay | Admitting: Internal Medicine

## 2017-06-27 ENCOUNTER — Other Ambulatory Visit: Payer: Self-pay | Admitting: Internal Medicine

## 2017-08-15 ENCOUNTER — Other Ambulatory Visit: Payer: Self-pay | Admitting: Internal Medicine

## 2017-09-24 ENCOUNTER — Other Ambulatory Visit: Payer: Self-pay

## 2017-09-24 MED ORDER — AMLODIPINE BESYLATE 2.5 MG PO TABS
2.5000 mg | ORAL_TABLET | Freq: Every day | ORAL | 0 refills | Status: DC
Start: 2017-09-24 — End: 2017-12-31

## 2017-10-01 ENCOUNTER — Other Ambulatory Visit: Payer: Self-pay | Admitting: Internal Medicine

## 2017-10-20 ENCOUNTER — Other Ambulatory Visit: Payer: Self-pay | Admitting: Internal Medicine

## 2017-10-22 ENCOUNTER — Encounter: Payer: Self-pay | Admitting: Internal Medicine

## 2017-10-24 ENCOUNTER — Other Ambulatory Visit: Payer: Self-pay | Admitting: Internal Medicine

## 2017-10-24 MED ORDER — FUROSEMIDE 20 MG PO TABS: 20.0000 mg | ORAL_TABLET | Freq: Every day | ORAL | 0 refills | Status: DC

## 2017-11-10 ENCOUNTER — Other Ambulatory Visit: Payer: Self-pay

## 2017-11-10 MED ORDER — LOSARTAN POTASSIUM 100 MG PO TABS: 100.0000 mg | ORAL_TABLET | Freq: Every day | ORAL | 0 refills | Status: DC

## 2017-11-10 NOTE — Telephone Encounter (Signed)
Refilled: 08/15/2017 Last OV: 06/19/2016 Next OV: not scheduled

## 2017-11-12 ENCOUNTER — Other Ambulatory Visit: Payer: Self-pay

## 2017-11-12 MED ORDER — LOSARTAN POTASSIUM 100 MG PO TABS: 100.0000 mg | ORAL_TABLET | Freq: Every day | ORAL | 0 refills | Status: DC

## 2017-12-30 ENCOUNTER — Encounter: Payer: Self-pay | Admitting: Internal Medicine

## 2017-12-31 MED ORDER — AMLODIPINE BESYLATE 2.5 MG PO TABS: 2.5000 mg | ORAL_TABLET | Freq: Every day | ORAL | 2 refills | Status: DC

## 2017-12-31 MED ORDER — LOSARTAN POTASSIUM 100 MG PO TABS: 100.0000 mg | ORAL_TABLET | Freq: Every day | ORAL | 2 refills | Status: DC

## 2018-01-13 ENCOUNTER — Telehealth: Payer: Self-pay | Admitting: Cardiovascular Disease

## 2018-01-13 NOTE — Telephone Encounter (Signed)
Hi Autumn Martinez,   Dr. Rockey Situ can see you in the [facility] on 01-27-18 at 8 am.  Please let me know if this appointment time works for you.   Thank you   [facility] Scheduling  ===View-only below this line===   ----- Message -----    From: Autumn Martinez    Sent: 01/12/2018  8:28 AM EST      To: Patient Appointment Schedule Request Mailing List Subject: Appointment Request  Appointment Request From: Autumn Martinez  With Provider: Ida Rogue, MD [place]  Preferred Date Range: Any  Preferred Times: Any time  Reason for visit: Request an Appointment  Comments: Prefer early morning 0800

## 2018-01-15 NOTE — Telephone Encounter (Signed)
lmov to confirm hold appt time

## 2018-01-18 ENCOUNTER — Other Ambulatory Visit: Payer: Self-pay | Admitting: Internal Medicine

## 2018-01-19 ENCOUNTER — Encounter: Payer: Self-pay | Admitting: Cardiovascular Disease

## 2018-01-19 NOTE — Telephone Encounter (Signed)
Unable to contact mailed letter.  °

## 2018-02-04 ENCOUNTER — Encounter: Payer: Self-pay | Admitting: Physician Assistant

## 2018-02-04 ENCOUNTER — Ambulatory Visit (INDEPENDENT_AMBULATORY_CARE_PROVIDER_SITE_OTHER): Payer: 59 | Admitting: Physician Assistant

## 2018-02-04 ENCOUNTER — Other Ambulatory Visit: Payer: Self-pay | Admitting: Physician Assistant

## 2018-02-04 VITALS — BP 140/80 | HR 74 | Temp 98.3°F | Resp 16 | Ht 64.17 in | Wt 159.0 lb

## 2018-02-04 DIAGNOSIS — K219 Gastro-esophageal reflux disease without esophagitis: Secondary | ICD-10-CM | POA: Diagnosis not present

## 2018-02-04 MED ORDER — OMEPRAZOLE 40 MG PO CPDR: 40.0000 mg | DELAYED_RELEASE_CAPSULE | Freq: Every day | ORAL | 1 refills | Status: DC

## 2018-02-04 NOTE — Patient Instructions (Addendum)
    Food Choices for Gastroesophageal Reflux Disease, Adult When you have gastroesophageal reflux disease (GERD), the foods you eat and your eating habits are very important. Choosing the right foods can help ease your discomfort. What guidelines do I need to follow?  Choose fruits, vegetables, whole grains, and low-fat dairy products.  Choose low-fat meat, fish, and poultry.  Limit fats such as oils, salad dressings, butter, nuts, and avocado.  Keep a food diary. This helps you identify foods that cause symptoms.  Avoid foods that cause symptoms. These may be different for everyone.  Eat small meals often instead of 3 large meals a day.  Eat your meals slowly, in a place where you are relaxed.  Limit fried foods.  Cook foods using methods other than frying.  Avoid drinking alcohol.  Avoid drinking large amounts of liquids with your meals.  Avoid bending over or lying down until 2-3 hours after eating. What foods are not recommended? These are some foods and drinks that may make your symptoms worse: Vegetables Tomatoes. Tomato juice. Tomato and spaghetti sauce. Chili peppers. Onion and garlic. Horseradish. Fruits Oranges, grapefruit, and lemon (fruit and juice). Meats High-fat meats, fish, and poultry. This includes hot dogs, ribs, ham, sausage, salami, and bacon. Dairy Whole milk and chocolate milk. Sour cream. Cream. Butter. Ice cream. Cream cheese. Drinks Coffee and tea. Bubbly (carbonated) drinks or energy drinks. Condiments Hot sauce. Barbecue sauce. Sweets/Desserts Chocolate and cocoa. Donuts. Peppermint and spearmint. Fats and Oils High-fat foods. This includes [place] fries and potato chips. Other Vinegar. Strong spices. This includes black pepper, white pepper, red pepper, cayenne, curry powder, cloves, ginger, and chili powder. The items listed above may not be a complete list of foods and drinks to avoid. Contact your dietitian for more information. This  information is not intended to replace advice given to you by your health care provider. Make sure you discuss any questions you have with your health care provider. Document Released: 04/28/2012 Document Revised: 04/04/2016 Document Reviewed: 09/01/2013 Elsevier Interactive Patient Education  2017 [employer].   IF you received an x-ray today, you will receive an invoice from [facility]. Please contact [facility] at [phone] with questions or concerns regarding your invoice.   IF you received labwork today, you will receive an invoice from [place]. Please contact [facility] at [phone] with questions or concerns regarding your invoice.   Our billing staff will not be able to assist you with questions regarding bills from these companies.  You will be contacted with the lab results as soon as they are available. The fastest way to get your results is to activate your My Chart account. Instructions are located on the last page of this paperwork. If you have not heard from [place] regarding the results in 2 weeks, please contact this office.

## 2018-02-04 NOTE — Progress Notes (Signed)
PRIMARY CARE AT [facility] [address] [place] [address] [phone]  Date:  02/04/2018   Name:  Autumn Martinez   DOB:  07/05/1976   MRN:  [id number]  PCP:  Autumn Mc, MD    History of Present Illness:  Autumn Martinez is a 42 y.o. female patient who presents to PCP with  Chief Complaint  Patient presents with  . swallowing Trouble    lasted for 3 weeks. mainly food items and occationally choke on food     3 weeks ago, she developed trouble with swallowing.  She is choking on foods such as cookie.  She has no trouble drinking liquids.  She feels that she has to clear her throat.  No throat pain.  She has some snoring, no sleep apnea.  She has some congestion.  She has no significant vision changes.  She has some headaches that wake her up in the middle of the night.  She deduced this to work stressors.   No sour taste in mouth, chest pains, or nausea.  No black or bloody stool.  She has some belching.   No history of thyroid problems.   She has been eating plain foods, she is eating tomato based foods.  No spicy or fried foods EtOH: none Rare NSAID use.   She drinks coffee 1-2 cups per day patient denies any family history of autoimmune illnesses.   Patient does not endorse neurological disorders in family either or personally.  Patient Active Problem List   Diagnosis Date Noted  . PMDD (premenstrual dysphoric disorder) 06/20/2016  . PVC (premature ventricular contraction) 07/20/2015  . Encounter for preventive health examination 10/25/2014  . Pap smear for cervical cancer screening 10/25/2014  . Food allergy 10/25/2014  . Heart murmur, systolic 23/53/6144  . Fatigue 05/18/2013  . Menorrhagia 05/18/2013  . PTSD (post-traumatic stress disorder) 05/18/2013  . ANEMIA-IRON DEFICIENCY 03/10/2009  . Essential hypertension 02/16/2009    Past Medical History:  Diagnosis Date  . Allergy   . Depression   . Heart murmur   . Hypertension     Past Surgical History:   Procedure Laterality Date  . CESAREAN SECTION  2002  . CESAREAN SECTION  2007  . CHOLECYSTECTOMY  2005  . TONSILLECTOMY AND ADENOIDECTOMY Bilateral 1980    Social History   Tobacco Use  . Smoking status: Never Smoker  . Smokeless tobacco: Never Used  Substance Use Topics  . Alcohol use: No  . Drug use: No    Family History  Problem Relation Age of Onset  . Hypertension Mother   . Hypertension Father   . Cancer Father   . Cancer Maternal Aunt   . Breast cancer Maternal Aunt 48  . Mental retardation Maternal Uncle     Allergies  Allergen Reactions  . Butorphanol Tartrate   . Sulfonamide Derivatives   . Hctz [Hydrochlorothiazide] Rash  . Other Rash    Medication list has been reviewed and updated.  Current Outpatient Medications on File Prior to Visit  Medication Sig Dispense Refill  . amLODipine (NORVASC) 2.5 MG tablet TAKE 1 TABLET (2.5 MG TOTAL) BY MOUTH DAILY. 90 tablet 0  . amLODipine (NORVASC) 2.5 MG tablet Take 1 tablet (2.5 mg total) by mouth daily. 30 tablet 2  . busPIRone (BUSPAR) 5 MG tablet Take 1 tablet (5 mg total) by mouth 2 (two) times daily as needed. 180 tablet 0  . clindamycin-benzoyl peroxide (BENZACLIN) gel APPLY TOPICALLY 2 TIMES DAILY. (Patient taking  differently: APPLY TOPICALLY ONCE DAILY) 35 g 1  . clindamycin-benzoyl peroxide (BENZACLIN) gel APPLY TOPICALLY 2 TIMES DAILY. 35 g 2  . EPINEPHrine 0.3 mg/0.3 mL IJ SOAJ injection Inject 0.3 mLs (0.3 mg total) into the muscle once. (Patient taking differently: Inject 0.3 mg into the muscle once as needed. ) 1 Device 1  . famotidine (PEPCID) 40 MG tablet Take 1 tablet (40 mg total) by mouth 2 (two) times daily. (Patient taking differently: Take 40 mg by mouth 2 (two) times daily as needed. ) 30 tablet 0  . furosemide (LASIX) 20 MG tablet Take 1 tablet (20 mg total) by mouth daily. As needed for edema 90 tablet 0  . losartan (COZAAR) 100 MG tablet TAKE 1 TABLET BY MOUTH EVERY DAY 90 tablet 0  .  Multiple Vitamins-Minerals (MULTIVITAMIN WITH MINERALS) tablet Take 1 tablet by mouth daily.    . propranolol (INDERAL) 10 MG tablet Take 1 tablet (10 mg total) by mouth 3 (three) times daily as needed. 90 tablet 6  . sertraline (ZOLOFT) 50 MG tablet Take 1 tablet (50 mg total) by mouth daily. (Patient taking differently: Take 25 mg by mouth daily. ) 90 tablet 1   No current facility-administered medications on file prior to visit.     ROS ROS otherwise unremarkable unless listed above.  Physical Examination: BP 140/80 (BP Location: Left Arm, Patient Position: Sitting, Cuff Size: Normal)   Pulse 74   Temp 98.3 F (36.8 C) (Oral)   Resp 16   Ht 5' 4.17" (1.63 m)   Wt 159 lb (72.1 kg)   LMP 01/06/2018   SpO2 100%   BMI 27.15 kg/m  Ideal Body Weight: Weight in (lb) to have BMI = 25: 146.1  Physical Exam  Constitutional: She is oriented to person, place, and time. She appears well-developed and well-nourished. No distress.  HENT:  Head: Normocephalic and atraumatic.  Right Ear: External ear normal.  Left Ear: External ear normal.  Normal dentition.  Eyes: Pupils are equal, round, and reactive to light. Conjunctivae and EOM are normal.  Neck: Trachea normal, full passive range of motion without pain and phonation normal. No tracheal deviation present. No thyroid mass and no thyromegaly present.  Cardiovascular: Normal rate and regular rhythm.  Pulmonary/Chest: Effort normal. No respiratory distress.  Abdominal: Normal appearance and bowel sounds are normal. There is no tenderness.  Neurological: She is alert and oriented to person, place, and time.  Swallowing with normal tracking.  Skin: She is not diaphoretic.  Psychiatric: She has a normal mood and affect. Her behavior is normal.     Assessment and Plan: Autumn Martinez is a 42 y.o. female who is here today for cc of  Chief Complaint  Patient presents with  . swallowing Trouble    lasted for 3 weeks. mainly food items  and occationally choke on food  She go ahead and start Prilosec.  I am also giving her a GERD/gastritis diet at this time. She will follow-up with her primary care, [place] in 2 weeks.  She has an appointment already set up.  If symptoms worsen which were discussed with the patient she will come back.,  At that time we may need to consider barium swallow. Gastroesophageal reflux disease without esophagitis - Plan: omeprazole (PRILOSEC) 40 MG capsule  Ivar Drape, PA-C [facility] 3/29/20192:53 PM

## 2018-02-16 ENCOUNTER — Encounter: Payer: Self-pay | Admitting: Internal Medicine

## 2018-02-16 ENCOUNTER — Ambulatory Visit (INDEPENDENT_AMBULATORY_CARE_PROVIDER_SITE_OTHER): Payer: 59 | Admitting: Internal Medicine

## 2018-02-16 VITALS — BP 138/82 | HR 77 | Temp 98.3°F | Resp 14 | Ht 64.0 in | Wt 157.0 lb

## 2018-02-16 DIAGNOSIS — K219 Gastro-esophageal reflux disease without esophagitis: Secondary | ICD-10-CM | POA: Diagnosis not present

## 2018-02-16 DIAGNOSIS — Z1231 Encounter for screening mammogram for malignant neoplasm of breast: Secondary | ICD-10-CM

## 2018-02-16 DIAGNOSIS — I10 Essential (primary) hypertension: Secondary | ICD-10-CM | POA: Diagnosis not present

## 2018-02-16 DIAGNOSIS — Z1239 Encounter for other screening for malignant neoplasm of breast: Secondary | ICD-10-CM

## 2018-02-16 MED ORDER — AMLODIPINE BESYLATE 2.5 MG PO TABS: 2.5000 mg | ORAL_TABLET | Freq: Every day | ORAL | 3 refills | Status: DC

## 2018-02-16 MED ORDER — CLINDAMYCIN PHOS-BENZOYL PEROX 1-5 % EX GEL: CUTANEOUS | 1 refills | Status: DC

## 2018-02-16 NOTE — Progress Notes (Signed)
Subjective:  Patient ID: Autumn Martinez, female    DOB: 04/13/1976  Age: 42 y.o. MRN: [id number]  CC: The primary encounter diagnosis was Essential hypertension. Diagnoses of Breast cancer screening and Gastroesophageal reflux disease without esophagitis were also pertinent to this visit.  HPI Autumn Martinez presents for follow up on hypertension managed with low dose amldipine  PAP SMEAR 2015 ENDOCERVIX ABSENT  HPV NEG MAMMOGRAM 2018 POST RIGHT BREAST BIOPSY  BENIGN  NeEDS EVENING HOURS FOR MAMMOGRAM wants it done at the  [place]    taking  d 2000 [place] of D3 daily   Hypertension: patient checks blood pressure twice weekly at home.  Readings have been <130/80 at rest . Patient is following a reduced salt diet most days and is taking medications as prescribed (losartan and amlodipine)  No recent use of lasix.    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   Developed dysphagia ,  Was placed on OMEPRAZOLE March 27 . Symptoms improving . Does not want swallow eval. discussed her concerns about long term PPI use.     Outpatient Medications Prior to Visit  Medication Sig Dispense Refill  . EPINEPHrine 0.3 mg/0.3 mL IJ SOAJ injection Inject 0.3 mLs (0.3 mg total) into the muscle once. (Patient taking differently: Inject 0.3 mg into the muscle once as needed. ) 1 Device 1  . furosemide (LASIX) 20 MG tablet Take 1 tablet (20 mg total) by mouth daily. As needed for edema 90 tablet 0  . losartan (COZAAR) 100 MG tablet TAKE 1 TABLET BY MOUTH EVERY DAY 90 tablet 0  . Multiple Vitamins-Minerals (MULTIVITAMIN WITH MINERALS) tablet Take 1 tablet by mouth daily.    Marland Kitchen omeprazole (PRILOSEC) 40 MG capsule Take 1 capsule (40 mg total) by mouth daily. 30 capsule 1  . amLODipine (NORVASC) 2.5 MG tablet Take 1 tablet (2.5 mg total) by mouth daily. 30 tablet 2  . busPIRone (BUSPAR) 5 MG tablet Take 1 tablet (5 mg total) by mouth 2 (two) times  daily as needed. 180 tablet 0  . clindamycin-benzoyl peroxide (BENZACLIN) gel APPLY TOPICALLY 2 TIMES DAILY. 35 g 1  . famotidine (PEPCID) 40 MG tablet Take 1 tablet (40 mg total) by mouth 2 (two) times daily. 30 tablet 0  . propranolol (INDERAL) 10 MG tablet Take 1 tablet (10 mg total) by mouth 3 (three) times daily as needed. 90 tablet 6  . sertraline (ZOLOFT) 50 MG tablet Take 1 tablet (50 mg total) by mouth daily. (Patient taking differently: Take 25 mg by mouth daily. ) 90 tablet 1  . amLODipine (NORVASC) 2.5 MG tablet TAKE 1 TABLET (2.5 MG TOTAL) BY MOUTH DAILY. (Patient not taking: Reported on 02/16/2018) 90 tablet 0  . clindamycin-benzoyl peroxide (BENZACLIN) gel APPLY TOPICALLY 2 TIMES DAILY. (Patient not taking: Reported on 02/16/2018) 35 g 2   No facility-administered medications prior to visit.     Review of Systems;  Patient denies headache, fevers, malaise, unintentional weight loss, skin rash, eye pain, sinus congestion and sinus pain, sore throat, dysphagia,  hemoptysis , cough, dyspnea, wheezing, chest pain, palpitations, orthopnea, edema, abdominal pain, nausea, melena, diarrhea, constipation, flank pain, dysuria, hematuria, urinary  Frequency, nocturia, numbness, tingling, seizures,  Focal weakness, Loss of consciousness,  Tremor, insomnia, depression, anxiety, and suicidal ideation.      Objective:  BP 138/82 (BP Location: Left Arm, Patient Position: Sitting, Cuff Size: Normal)   Pulse 77  Temp 98.3 F (36.8 C) (Oral)   Resp 14   Ht 5\' 4"  (1.626 m)   Wt 157 lb (71.2 kg)   SpO2 98%   BMI 26.95 kg/m   BP Readings from Last 3 Encounters:  02/16/18 138/82  02/04/18 140/80  12/06/16 130/78    Wt Readings from Last 3 Encounters:  02/16/18 157 lb (71.2 kg)  02/04/18 159 lb (72.1 kg)  12/06/16 154 lb 8 oz (70.1 kg)    General appearance: alert, cooperative and appears stated age Ears: normal TM's and external ear canals both ears Throat: lips, mucosa, and tongue  normal; teeth and gums normal Neck: no adenopathy, no carotid bruit, supple, symmetrical, trachea midline and thyroid not enlarged, symmetric, no tenderness/mass/nodules Back: symmetric, no curvature. ROM normal. No CVA tenderness. Lungs: clear to auscultation bilaterally Heart: regular rate and rhythm, S1, S2 normal, no murmur, click, rub or gallop Abdomen: soft, non-tender; bowel sounds normal; no masses,  no organomegaly Pulses: 2+ and symmetric Skin: Skin color, texture, turgor normal. No rashes or lesions Lymph nodes: Cervical, supraclavicular, and axillary nodes normal.  No results found for: HGBA1C  Lab Results  Component Value Date   CREATININE 0.77 02/16/2018   CREATININE 0.78 06/05/2017   CREATININE 0.90 08/30/2016    Lab Results  Component Value Date   WBC 7.1 06/05/2017   HGB 13.1 06/05/2017   HCT 39.6 06/05/2017   PLT 224.0 06/05/2017   GLUCOSE 82 02/16/2018   CHOL 163 06/05/2017   TRIG 71.0 06/05/2017   HDL 55.50 06/05/2017   LDLCALC 94 06/05/2017   ALT 10 02/16/2018   AST 19 02/16/2018   NA 137 02/16/2018   K 3.6 02/16/2018   CL 101 02/16/2018   CREATININE 0.77 02/16/2018   BUN 11 02/16/2018   CO2 28 02/16/2018   TSH 1.39 08/30/2016   INR 1.0 08/30/2016     Assessment & Plan:   Problem List Items Addressed This Visit    GERD (gastroesophageal reflux disease)    Not well mentioned on H2 blocker,  Omeprazole advised.       Essential hypertension - Primary    Well controlled on current regimen based on home readings . Renal function stable, no changes today.  Lab Results  Component Value Date   CREATININE 0.77 02/16/2018   Lab Results  Component Value Date   NA 137 02/16/2018   K 3.6 02/16/2018   CL 101 02/16/2018   CO2 28 02/16/2018         Relevant Medications   amLODipine (NORVASC) 2.5 MG tablet   Other Relevant Orders   Comprehensive metabolic panel (Completed)    Other Visit Diagnoses    Breast cancer screening       Relevant  Orders   MM 3D SCREEN BREAST BILATERAL    A total of 25 minutes of face to face time was spent with patient more than half of which was spent in counselling about the above mentioned conditions  and coordination of care   I have discontinued Autumn Martinez's famotidine, propranolol, busPIRone, and sertraline. I am also having her maintain her multivitamin with minerals, EPINEPHrine, furosemide, losartan, omeprazole, clindamycin-benzoyl peroxide, and amLODipine.  Meds ordered this encounter  Medications  . clindamycin-benzoyl peroxide (BENZACLIN) gel    Sig: APPLY TOPICALLY 2 TIMES DAILY.    Dispense:  35 g    Refill:  1    KEEP ON FILE FOR FUTURE REFILLS  . amLODipine (NORVASC) 2.5 MG tablet  Sig: Take 1 tablet (2.5 mg total) by mouth daily.    Dispense:  90 tablet    Refill:  3    Medications Discontinued During This Encounter  Medication Reason  . amLODipine (NORVASC) 2.5 MG tablet Duplicate  . clindamycin-benzoyl peroxide (BENZACLIN) gel Duplicate  . busPIRone (BUSPAR) 5 MG tablet   . clindamycin-benzoyl peroxide (BENZACLIN) gel Reorder  . amLODipine (NORVASC) 2.5 MG tablet Reorder  . propranolol (INDERAL) 10 MG tablet   . sertraline (ZOLOFT) 50 MG tablet   . famotidine (PEPCID) 40 MG tablet     Follow-up: Return in about 2 months (around 04/18/2018) for FOR CPE WITH PAP.   Crecencio Mc, MD

## 2018-02-16 NOTE — Patient Instructions (Signed)
Good to see you!   I have refilled your meds for a year   Mammogram will be ordered  RTC June for PAP smera

## 2018-02-17 DIAGNOSIS — K219 Gastro-esophageal reflux disease without esophagitis: Secondary | ICD-10-CM | POA: Insufficient documentation

## 2018-02-17 LAB — COMPREHENSIVE METABOLIC PANEL
ALK PHOS: 67 U/L (ref 39–117)
ALT: 10 U/L (ref 0–35)
AST: 19 U/L (ref 0–37)
Albumin: 4.2 g/dL (ref 3.5–5.2)
BILIRUBIN TOTAL: 0.4 mg/dL (ref 0.2–1.2)
BUN: 11 mg/dL (ref 6–23)
CO2: 28 mEq/L (ref 19–32)
Calcium: 9.1 mg/dL (ref 8.4–10.5)
Chloride: 101 mEq/L (ref 96–112)
Creatinine, Ser: 0.77 mg/dL (ref 0.40–1.20)
GFR: 86.62 mL/min (ref >60.00)
GLUCOSE: 82 mg/dL (ref 70–99)
Potassium: 3.6 mEq/L (ref 3.5–5.1)
Sodium: 137 mEq/L (ref 135–145)
TOTAL PROTEIN: 7.4 g/dL (ref 6.0–8.3)

## 2018-02-17 NOTE — Assessment & Plan Note (Signed)
Well controlled on current regimen based on home readings . Renal function stable, no changes today.  Lab Results  Component Value Date   CREATININE 0.77 02/16/2018   Lab Results  Component Value Date   NA 137 02/16/2018   K 3.6 02/16/2018   CL 101 02/16/2018   CO2 28 02/16/2018

## 2018-02-17 NOTE — Assessment & Plan Note (Signed)
Not well mentioned on H2 blocker,  Omeprazole advised.

## 2018-02-18 ENCOUNTER — Encounter: Payer: Self-pay | Admitting: Physician Assistant

## 2018-02-20 ENCOUNTER — Ambulatory Visit: Payer: Self-pay | Admitting: Internal Medicine

## 2018-03-30 ENCOUNTER — Encounter: Payer: Self-pay | Admitting: Internal Medicine

## 2018-03-30 DIAGNOSIS — K219 Gastro-esophageal reflux disease without esophagitis: Secondary | ICD-10-CM

## 2018-03-30 MED ORDER — OMEPRAZOLE 40 MG PO CPDR: 40.0000 mg | DELAYED_RELEASE_CAPSULE | Freq: Every day | ORAL | 1 refills | Status: DC

## 2018-04-03 ENCOUNTER — Encounter: Payer: Self-pay | Admitting: Internal Medicine

## 2018-04-05 ENCOUNTER — Other Ambulatory Visit: Payer: Self-pay | Admitting: Internal Medicine

## 2018-04-05 DIAGNOSIS — Z91018 Allergy to other foods: Secondary | ICD-10-CM

## 2018-04-05 DIAGNOSIS — I493 Ventricular premature depolarization: Secondary | ICD-10-CM

## 2018-04-09 ENCOUNTER — Ambulatory Visit
Admission: RE | Admit: 2018-04-09 | Discharge: 2018-04-09 | Disposition: A | Discharge status: Home / Self Care | Payer: 59 | Source: Ambulatory Visit | Attending: Internal Medicine | Admitting: Internal Medicine

## 2018-04-09 DIAGNOSIS — Z1239 Encounter for other screening for malignant neoplasm of breast: Secondary | ICD-10-CM

## 2018-04-10 ENCOUNTER — Other Ambulatory Visit: Payer: Self-pay | Admitting: Internal Medicine

## 2018-04-10 ENCOUNTER — Encounter: Payer: Self-pay | Admitting: Internal Medicine

## 2018-04-10 DIAGNOSIS — R928 Other abnormal and inconclusive findings on diagnostic imaging of breast: Secondary | ICD-10-CM

## 2018-04-12 ENCOUNTER — Emergency Department: Payer: 59

## 2018-04-12 ENCOUNTER — Emergency Department
Admission: EM | Admit: 2018-04-12 | Discharge: 2018-04-12 | Disposition: A | Discharge status: Home / Self Care | Payer: 59 | Attending: Emergency Medicine | Admitting: Emergency Medicine

## 2018-04-12 ENCOUNTER — Encounter: Payer: Self-pay | Admitting: Emergency Medicine

## 2018-04-12 DIAGNOSIS — G501 Atypical facial pain: Secondary | ICD-10-CM | POA: Insufficient documentation

## 2018-04-12 DIAGNOSIS — W010XXA Fall on same level from slipping, tripping and stumbling without subsequent striking against object, initial encounter: Secondary | ICD-10-CM | POA: Insufficient documentation

## 2018-04-12 DIAGNOSIS — I10 Essential (primary) hypertension: Secondary | ICD-10-CM | POA: Insufficient documentation

## 2018-04-12 DIAGNOSIS — Y998 Other external cause status: Secondary | ICD-10-CM | POA: Diagnosis not present

## 2018-04-12 DIAGNOSIS — Y9302 Activity, running: Secondary | ICD-10-CM | POA: Diagnosis not present

## 2018-04-12 DIAGNOSIS — Y929 Unspecified place or not applicable: Secondary | ICD-10-CM | POA: Diagnosis not present

## 2018-04-12 DIAGNOSIS — Z79899 Other long term (current) drug therapy: Secondary | ICD-10-CM | POA: Diagnosis not present

## 2018-04-12 DIAGNOSIS — W19XXXA Unspecified fall, initial encounter: Secondary | ICD-10-CM

## 2018-04-12 NOTE — ED Provider Notes (Signed)
[facility] Emergency Department Provider Note  ____________________________________________  Time seen: Approximately 5:46 PM  I have reviewed the triage vital signs and the nursing notes.   HISTORY  Chief Complaint Fall    HPI Autumn Martinez is a 42 y.o. female presents to the emergency department with nasal bridge pain after patient reports that she fell while running.  Patient did not lose consciousness.  She reports no blurry vision or pain with extraocular eye muscle movement.  Patient does report some tenderness along left inferior orbit.  Patient sustained abrasions to right lower extremity.  She denies nausea and vomiting.  Patient has had no difficulty with ambulation has not experienced vertigo or syncope.  Her tetanus status is up-to-date.  Past Medical History:  Diagnosis Date  . Allergy   . Depression   . Heart murmur   . Hypertension     Patient Active Problem List   Diagnosis Date Noted  . GERD (gastroesophageal reflux disease) 02/17/2018  . PMDD (premenstrual dysphoric disorder) 06/20/2016  . PVC (premature ventricular contraction) 07/20/2015  . Encounter for preventive health examination 10/25/2014  . Pap smear for cervical cancer screening 10/25/2014  . Food allergy 10/25/2014  . Heart murmur, systolic 93/81/8299  . Fatigue 05/18/2013  . Menorrhagia 05/18/2013  . PTSD (post-traumatic stress disorder) 05/18/2013  . Essential hypertension 02/16/2009    Past Surgical History:  Procedure Laterality Date  . BREAST BIOPSY Right 2018   benign  . CESAREAN SECTION  2002  . CESAREAN SECTION  2007  . CHOLECYSTECTOMY  2005  . TONSILLECTOMY AND ADENOIDECTOMY Bilateral 1980    Prior to Admission medications   Medication Sig Start Date End Date Taking? Authorizing Provider  amLODipine (NORVASC) 2.5 MG tablet Take 1 tablet (2.5 mg total) by mouth daily. 02/16/18   Crecencio Mc, MD  clindamycin-benzoyl peroxide (BENZACLIN) gel APPLY  TOPICALLY 2 TIMES DAILY. 02/16/18   Crecencio Mc, MD  EPINEPHrine 0.3 mg/0.3 mL IJ SOAJ injection Inject 0.3 mLs (0.3 mg total) into the muscle once. Patient taking differently: Inject 0.3 mg into the muscle once as needed.  10/25/14   Crecencio Mc, MD  furosemide (LASIX) 20 MG tablet Take 1 tablet (20 mg total) by mouth daily. As needed for edema 10/24/17   Crecencio Mc, MD  losartan (COZAAR) 100 MG tablet TAKE 1 TABLET BY MOUTH EVERY DAY 01/19/18   Crecencio Mc, MD  Multiple Vitamins-Minerals (MULTIVITAMIN WITH MINERALS) tablet Take 1 tablet by mouth daily.    [provider]  omeprazole (PRILOSEC) 40 MG capsule Take 1 capsule (40 mg total) by mouth daily. 03/30/18   Crecencio Mc, MD    Allergies Butorphanol tartrate; Sulfonamide derivatives; Hctz [hydrochlorothiazide]; and Other  Family History  Problem Relation Age of Onset  . Hypertension Mother   . Hypertension Father   . Cancer Father   . Cancer Maternal Aunt   . Breast cancer Maternal Aunt 48  . Mental retardation Maternal Uncle     Social History Social History   Tobacco Use  . Smoking status: Never Smoker  . Smokeless tobacco: Never Used  Substance Use Topics  . Alcohol use: No  . Drug use: No     Review of Systems  Constitutional: No fever/chills Eyes: No visual changes. No discharge ENT: No upper respiratory complaints. Cardiovascular: no chest pain. Respiratory: no cough. No SOB. Gastrointestinal: No abdominal pain.  No nausea, no vomiting.  No diarrhea.  No constipation. Genitourinary: Negative for  dysuria. No hematuria Musculoskeletal: Negative for musculoskeletal pain. Skin: Patient has nasal bridge pain and abrasions.  Neurological: Negative for headaches, focal weakness or numbness.   ____________________________________________   PHYSICAL EXAM:  VITAL SIGNS: ED Triage Vitals  Enc Vitals Group     BP 04/12/18 1548 (!) 144/86     Pulse Rate 04/12/18 1548 93     Resp  04/12/18 1548 16     Temp 04/12/18 1548 98.7 F (37.1 C)     Temp Source 04/12/18 1548 Oral     SpO2 04/12/18 1548 98 %     Weight 04/12/18 1543 148 lb (67.1 kg)     Height 04/12/18 1543 5\' 4"  (1.626 m)     Head Circumference --      Peak Flow --      Pain Score 04/12/18 1543 5     Pain Loc --      Pain Edu? --      Excl. in [place]? --      Constitutional: Alert and oriented. Well appearing and in no acute distress. Eyes: Conjunctivae are normal. PERRL. EOMI. Head: Atraumatic.  Patient has mild tenderness to palpation along left inferior orbit. ENT:      Ears: TMs are pearly.      Nose: No congestion/rhinnorhea.  Mild ecchymosis is visualized along the bridge of the nose.      Mouth/Throat: Mucous membranes are moist.  Neck: No stridor.  No cervical spine tenderness to palpation. Hematological/Lymphatic/Immunilogical: No cervical lymphadenopathy. Cardiovascular: Normal rate, regular rhythm. Normal S1 and S2.  Good peripheral circulation. Respiratory: Normal respiratory effort without tachypnea or retractions. Lungs CTAB. Good air entry to the bases with no decreased or absent breath sounds. Musculoskeletal: Full range of motion to all extremities. No gross deformities appreciated. Neurologic:  Normal speech and language. No gross focal neurologic deficits are appreciated.  Skin:  Skin is warm, dry and intact. No rash noted. Psychiatric: Mood and affect are normal. Speech and behavior are normal. Patient exhibits appropriate insight and judgement.   ____________________________________________   LABS (all labs ordered are listed, but only abnormal results are displayed)  Labs Reviewed - No data to display ____________________________________________  EKG   ____________________________________________  RADIOLOGY Unk Pinto, personally viewed and evaluated these images (plain radiographs) as part of my medical decision making, as well as reviewing the written report by  the radiologist.  Ct Maxillofacial Wo Contrast  Result Date: 04/12/2018 CLINICAL DATA:  Recent fall with facial pain, initial encounter EXAM: CT MAXILLOFACIAL WITHOUT CONTRAST TECHNIQUE: Multidetector CT imaging of the maxillofacial structures was performed. Multiplanar CT image reconstructions were also generated. COMPARISON:  None. FINDINGS: Osseous: No acute fracture is identified. Some degenerative changes of the TMJs are noted bilaterally. Orbits: Negative. No traumatic or inflammatory finding. Sinuses: Clear. Soft tissues: Negative. Limited intracranial: No significant or unexpected finding. IMPRESSION: Mild degenerative changes of the temporomandibular joints. No acute abnormality is noted. Electronically Signed   By: Inez Catalina M.D.   On: 04/12/2018 17:21    ____________________________________________    PROCEDURES  Procedure(s) performed:    Procedures    Medications - No data to display   ____________________________________________   INITIAL IMPRESSION / ASSESSMENT AND PLAN / ED COURSE  Pertinent labs & imaging results that were available during my care of the patient were reviewed by me and considered in my medical decision making (see chart for details).  Review of the [place] CSRS was performed in accordance of the [place] prior to  dispensing any controlled drugs.     Assessment and plan Facial pain Patient presents to the emergency department with nasal bridge pain and pain along the left inferior orbit after a fall that occurred today.  Differential diagnosis included blowout fracture, nasal bone fracture and facial contusion.  No acute abnormalities were visualized on CT examination of the face.  Vital signs are reassuring prior to discharge.  Tylenol and ibuprofen alternating were recommended for pain.    ____________________________________________  FINAL CLINICAL IMPRESSION(S) / ED DIAGNOSES  Final diagnoses:  Fall, initial encounter      NEW  MEDICATIONS STARTED DURING THIS VISIT:  ED Discharge Orders    None          This chart was dictated using voice recognition software/Dragon. Despite best efforts to proofread, errors can occur which can change the meaning. Any change was purely unintentional.    Lannie Fields, PA-C 04/12/18 1750    Nance Pear, MD 04/12/18 [phone]

## 2018-04-12 NOTE — Progress Notes (Signed)
Cardiology Office Note  Date:  04/14/2018   ID:  KEVEN SOUCY, DOB 05/22/1976, MRN [id number]  PCP:  Crecencio Mc, MD   Chief Complaint  Patient presents with  . Other    12 month follow up. Patient denies chest pain and SOB. Meds reviewed vebally with patient.     HPI:  Ms Autumn Martinez is a 42 year old woman with prior history of shortness of breath and murmur in 2014,  normal echocardiogram,  who presents for evaluation of PVCs and hypertension.  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 Occasionally PVCs would be in a trigeminal pattern. 500 PVCs over a 24-hour period.  family history Dad with CAD, stents, age 7s passed from [place] age 6    PMH:   has a past medical history of Allergy, Depression, Heart murmur, and Hypertension.  PSH:    Past Surgical History:  Procedure Laterality Date  . BREAST BIOPSY Right 2018   benign  . CESAREAN SECTION  2002  . CESAREAN SECTION  2007  . CHOLECYSTECTOMY  2005  . TONSILLECTOMY AND ADENOIDECTOMY Bilateral 1980    Current Outpatient Medications  Medication Sig Dispense Refill  . amLODipine (NORVASC) 2.5 MG tablet Take 1 tablet (2.5 mg total) by mouth daily. 90 tablet 3  . clindamycin-benzoyl peroxide (BENZACLIN) gel APPLY TOPICALLY 2 TIMES DAILY. 35 g 1  . EPINEPHrine 0.3 mg/0.3 mL IJ SOAJ injection Inject 0.3 mLs (0.3 mg total) into the muscle once. (Patient taking differently: Inject 0.3 mg into the muscle once as needed. ) 1 Device 1  .  furosemide (LASIX) 20 MG tablet Take 1 tablet (20 mg total) by mouth daily. As needed for edema 90 tablet 0  . losartan (COZAAR) 100 MG tablet TAKE 1 TABLET BY MOUTH EVERY DAY 90 tablet 0  . Multiple Vitamins-Minerals (MULTIVITAMIN WITH MINERALS) tablet Take 1 tablet by mouth daily.    Marland Kitchen omeprazole (PRILOSEC) 40 MG capsule Take 1 capsule (40 mg total) by mouth daily. 90 capsule 1   No current facility-administered medications for this visit.      Allergies:   Butorphanol tartrate; Sulfonamide derivatives; Hctz [hydrochlorothiazide]; and Other   Social History:  The patient  reports that she has never smoked. She has never used smokeless tobacco. She reports that she does not drink alcohol or use drugs.   Family History:   family history includes Breast cancer (age of onset: 40) in her maternal aunt; Cancer in her father and maternal aunt; Hypertension in her father and mother; Mental retardation in her maternal uncle.    Review of Systems: Review of Systems  Constitutional: Negative.   Respiratory: Negative.   Cardiovascular: Negative.   Gastrointestinal: Negative.   Musculoskeletal: Negative.   Neurological: Negative.   Psychiatric/Behavioral: Negative.   All other systems reviewed and are negative.    PHYSICAL EXAM: VS:  BP (!) 142/87 (BP Location: Left Arm, Patient Position: Sitting, Cuff Size: Normal)   Pulse 69   Ht 5\' 4"  (1.626 m)   Wt 146 lb (66.2 kg)   LMP  03/30/2018   BMI 25.06 kg/m  , BMI Body mass index is 25.06 kg/m. Constitutional:  oriented to person, place, and time. No distress.  HENT:  Head: Normocephalic and atraumatic.  Eyes:  no discharge. No scleral icterus.  Neck: Normal range of motion. Neck supple. No JVD present.  Cardiovascular: Normal rate, regular rhythm, normal heart sounds and intact distal pulses. Exam reveals no gallop and no friction rub. No edema No murmur heard. Pulmonary/Chest: Effort normal and breath sounds normal. No stridor. No  respiratory distress.  no wheezes.  no rales.  no tenderness.  Abdominal: Soft.  no distension.  no tenderness.  Musculoskeletal: Normal range of motion.  no  tenderness or deformity.  Neurological:  normal muscle tone. Coordination normal. No atrophy Skin: Skin is warm and dry. No rash noted. not diaphoretic.  Psychiatric:  normal mood and affect. behavior is normal. Thought content normal.   Recent Labs: 06/05/2017: Hemoglobin 13.1; Platelets 224.0 02/16/2018: ALT 10; BUN 11; Creatinine, Ser 0.77; Potassium 3.6; Sodium 137    Lipid Panel Lab Results  Component Value Date   CHOL 163 06/05/2017   HDL 55.50 06/05/2017   LDLCALC 94 06/05/2017   TRIG 71.0 06/05/2017      Wt Readings from Last 3 Encounters:  04/14/18 146 lb (66.2 kg)  04/12/18 148 lb (67.1 kg)  02/16/18 157 lb (71.2 kg)       ASSESSMENT AND PLAN:  PVC's (premature ventricular contractions) - Plan: EKG 12-Lead Not having any symptoms, not requiring beta blocker stable  Heart murmur, systolic - Plan: EKG 99-BZJI  echocardiogram 2014  No further testing needed, asymptomatic Previous echocardiogram detailed tricuspid aortic valve  Essential hypertension Blood pressure well controlled Doing well on her current regimen,   Disposition:   F/U  12 months as needed   Total encounter time more than 25 minutes  Greater than 50% was spent in counseling and coordination of care with the patient    Orders Placed This Encounter  Procedures  . EKG 12-Lead     Signed, Esmond Plants, M.D., Ph.D. 04/14/2018  [facility], [facility]

## 2018-04-12 NOTE — ED Triage Notes (Signed)
Patient presents to the ED after a fall while running.  Patient states, "I face planted."  Patient has painful nose and some bruising beside her left eye.  Patient denies losing consciousness.  Patient ambulatory with no obvious distress at this time.

## 2018-04-14 ENCOUNTER — Encounter: Payer: Self-pay | Admitting: Cardiovascular Disease

## 2018-04-14 ENCOUNTER — Ambulatory Visit: Payer: 59 | Admitting: Cardiovascular Disease

## 2018-04-14 VITALS — BP 142/87 | HR 69 | Ht 64.0 in | Wt 146.0 lb

## 2018-04-14 DIAGNOSIS — I493 Ventricular premature depolarization: Secondary | ICD-10-CM

## 2018-04-14 DIAGNOSIS — R011 Cardiac murmur, unspecified: Secondary | ICD-10-CM

## 2018-04-14 DIAGNOSIS — I10 Essential (primary) hypertension: Secondary | ICD-10-CM

## 2018-04-14 NOTE — Patient Instructions (Addendum)

## 2018-04-15 ENCOUNTER — Encounter: Payer: Self-pay | Admitting: Internal Medicine

## 2018-04-15 ENCOUNTER — Ambulatory Visit
Admission: RE | Admit: 2018-04-15 | Discharge: 2018-04-15 | Disposition: A | Discharge status: Home / Self Care | Payer: 59 | Source: Ambulatory Visit | Attending: Internal Medicine | Admitting: Internal Medicine

## 2018-04-15 ENCOUNTER — Other Ambulatory Visit: Payer: Self-pay | Admitting: Internal Medicine

## 2018-04-15 DIAGNOSIS — R921 Mammographic calcification found on diagnostic imaging of breast: Secondary | ICD-10-CM

## 2018-04-15 DIAGNOSIS — R928 Other abnormal and inconclusive findings on diagnostic imaging of breast: Secondary | ICD-10-CM

## 2018-04-17 ENCOUNTER — Other Ambulatory Visit: Payer: Self-pay | Admitting: Internal Medicine

## 2018-04-17 ENCOUNTER — Encounter: Payer: Self-pay | Admitting: Internal Medicine

## 2018-04-17 MED ORDER — DIAZEPAM 10 MG PO TABS: ORAL_TABLET | ORAL | 0 refills | Status: DC

## 2018-04-17 NOTE — Progress Notes (Signed)
rx for valium 10 mg #2 sent to [facility]

## 2018-04-21 ENCOUNTER — Ambulatory Visit
Admission: RE | Admit: 2018-04-21 | Discharge: 2018-04-21 | Disposition: A | Discharge status: Home / Self Care | Payer: 59 | Source: Ambulatory Visit | Attending: Internal Medicine | Admitting: Internal Medicine

## 2018-04-21 DIAGNOSIS — R921 Mammographic calcification found on diagnostic imaging of breast: Secondary | ICD-10-CM

## 2018-04-22 HISTORY — PX: BREAST BIOPSY: SHX20

## 2018-04-23 ENCOUNTER — Other Ambulatory Visit: Payer: Self-pay | Admitting: Internal Medicine

## 2018-05-04 ENCOUNTER — Encounter: Payer: Self-pay | Admitting: Internal Medicine

## 2018-05-04 ENCOUNTER — Ambulatory Visit (INDEPENDENT_AMBULATORY_CARE_PROVIDER_SITE_OTHER): Payer: 59 | Admitting: Internal Medicine

## 2018-05-04 VITALS — BP 134/88 | HR 86 | Temp 98.3°F | Resp 15 | Ht 64.0 in | Wt 161.6 lb

## 2018-05-04 DIAGNOSIS — Z0000 Encounter for general adult medical examination without abnormal findings: Secondary | ICD-10-CM | POA: Diagnosis not present

## 2018-05-04 DIAGNOSIS — Z124 Encounter for screening for malignant neoplasm of cervix: Secondary | ICD-10-CM | POA: Diagnosis not present

## 2018-05-04 DIAGNOSIS — F40243 Fear of flying: Secondary | ICD-10-CM | POA: Diagnosis not present

## 2018-05-04 DIAGNOSIS — I10 Essential (primary) hypertension: Secondary | ICD-10-CM | POA: Diagnosis not present

## 2018-05-04 MED ORDER — ONDANSETRON 4 MG PO TBDP: 4.0000 mg | ORAL_TABLET | Freq: Three times a day (TID) | ORAL | 0 refills | Status: DC | PRN

## 2018-05-04 MED ORDER — ALPRAZOLAM 0.25 MG PO TABS: 0.2500 mg | ORAL_TABLET | Freq: Two times a day (BID) | ORAL | 0 refills | Status: DC | PRN

## 2018-05-04 MED ORDER — PREDNISONE 10 MG PO TABS: ORAL_TABLET | ORAL | 0 refills | Status: DC

## 2018-05-04 MED ORDER — LEVOFLOXACIN 500 MG PO TABS: 500.0000 mg | ORAL_TABLET | Freq: Every day | ORAL | 0 refills | Status: DC

## 2018-05-04 NOTE — Progress Notes (Signed)
Patient ID: Autumn Martinez, female    DOB: Oct 08, 1976  Age: 42 y.o. MRN: [id number]  The patient is here for annual preventive examination and management of other chronic and acute problems.   Last PAP 2015  Breast biopsy June 11 , benign   The risk factors are reflected in the social history.  The roster of all physicians providing medical care to patient - is listed in the Snapshot section of the chart.  Activities of daily living:  The patient is 100% independent in all ADLs: dressing, toileting, feeding as well as independent mobility  Home safety : The patient has smoke detectors in the home. They wear seatbelts.  There are no firearms at home. There is no violence in the home.   There is no risks for hepatitis, STDs or HIV. There is no   history of blood transfusion. They have no travel history to infectious disease endemic areas of the world.  The patient has seen their dentist in the last six month. They have seen their eye doctor in the last year.    They do not  have excessive sun exposure. Discussed the need for sun protection: hats, long sleeves and use of sunscreen if there is significant sun exposure.   Diet: the importance of a healthy diet is discussed. They do have a healthy diet.  The benefits of regular aerobic exercise were discussed. She walks 4 times per week ,  20 minutes.   Depression screen: there are no signs or vegative symptoms of depression- irritability, change in appetite, anhedonia, sadness/tearfullness.   The following portions of the patient's history were reviewed and updated as appropriate: allergies, current medications, past family history, past medical history,  past surgical history, past social history  and problem list.  Visual acuity was not assessed per patient preference since she has regular follow up with her ophthalmologist. Hearing and body mass index were assessed and reviewed.   During the course of the visit the patient was educated  and counseled about appropriate screening and preventive services including : fall prevention , diabetes screening, nutrition counseling, colorectal cancer screening, and recommended immunizations.    CC: The primary encounter diagnosis was Cervical cancer screening. Diagnoses of Essential hypertension, Encounter for preventive health examination, and Fear of flying were also pertinent to this visit.  No issues..  Requesting alprazolam refill to manage flight related anxiety for upcoming trip to [place].   History Autumn Martinez has a past medical history of Allergy, Depression, Heart murmur, and Hypertension.   She has a past surgical history that includes Cesarean section (2002); Cesarean section (2007); Cholecystectomy (2005); Tonsillectomy and adenoidectomy (Bilateral, 1980); and Breast biopsy (Right, 2018).   Her family history includes Breast cancer (age of onset: 46) in her maternal aunt; Cancer in her father and maternal aunt; Hypertension in her father and mother; Mental retardation in her maternal uncle.She reports that she has never smoked. She has never used smokeless tobacco. She reports that she does not drink alcohol or use drugs.  Outpatient Medications Prior to Visit  Medication Sig Dispense Refill  . amLODipine (NORVASC) 2.5 MG tablet Take 1 tablet (2.5 mg total) by mouth daily. 90 tablet 3  . clindamycin-benzoyl peroxide (BENZACLIN) gel APPLY TOPICALLY 2 TIMES DAILY. 35 g 1  . EPINEPHrine 0.3 mg/0.3 mL IJ SOAJ injection Inject 0.3 mLs (0.3 mg total) into the muscle once. (Patient taking differently: Inject 0.3 mg into the muscle once as needed. ) 1 Device 1  . furosemide (LASIX)  20 MG tablet Take 1 tablet (20 mg total) by mouth daily. As needed for edema 90 tablet 0  . losartan (COZAAR) 100 MG tablet TAKE 1 TABLET BY MOUTH EVERY DAY 90 tablet 0  . Multiple Vitamins-Minerals (MULTIVITAMIN WITH MINERALS) tablet Take 1 tablet by mouth daily.    Marland Kitchen omeprazole (PRILOSEC) 40 MG capsule Take  1 capsule (40 mg total) by mouth daily. 90 capsule 1  . diazepam (VALIUM) 10 MG tablet 1 tablet 30 to 60 minutes prior to procedure , may repeat x 1 (Patient not taking: Reported on 05/04/2018) 2 tablet 0   No facility-administered medications prior to visit.     Review of Systems   Patient denies headache, fevers, malaise, unintentional weight loss, skin rash, eye pain, sinus congestion and sinus pain, sore throat, dysphagia,  hemoptysis , cough, dyspnea, wheezing, chest pain, palpitations, orthopnea, edema, abdominal pain, nausea, melena, diarrhea, constipation, flank pain, dysuria, hematuria, urinary  Frequency, nocturia, numbness, tingling, seizures,  Focal weakness, Loss of consciousness,  Tremor, insomnia, depression, anxiety, and suicidal ideation.      Objective:  BP 134/88 (BP Location: Left Arm, Patient Position: Sitting, Cuff Size: Normal)   Pulse 86   Temp 98.3 F (36.8 C) (Oral)   Resp 15   Ht 5\' 4"  (1.626 m)   Wt 161 lb 9.6 oz (73.3 kg)   SpO2 98%   BMI 27.74 kg/m   Physical Exam   General Appearance:    Alert, cooperative, no distress, appears stated age  Head:    Normocephalic, without obvious abnormality, atraumatic  Eyes:    PERRL, conjunctiva/corneas clear, EOM's intact, fundi    benign, both eyes  Ears:    Normal TM's and external ear canals, both ears  Nose:   Nares normal, septum midline, mucosa normal, no drainage    or sinus tenderness  Throat:   Lips, mucosa, and tongue normal; teeth and gums normal  Neck:   Supple, symmetrical, trachea midline, no adenopathy;    thyroid:  no enlargement/tenderness/nodules; no carotid   bruit or JVD  Back:     Symmetric, no curvature, ROM normal, no CVA tenderness  Lungs:     Clear to auscultation bilaterally, respirations unlabored  Chest Wall:    No tenderness or deformity   Heart:    Regular rate and rhythm, S1 and S2 normal, Grade 2 systolic murmur , no rub   or gallop  Breast Exam:    No tenderness, masses, or  nipple abnormality  Abdomen:     Soft, non-tender, bowel sounds active all four quadrants,    no masses, no organomegaly  Genitalia:    Pelvic: cervix obscured by menstrual blood.n external genitalia normal, no adnexal masses or tenderness, no cervical motion tenderness, rectovaginal septum normal, uterus normal size, shape, and consistency and vagina normal without discharge  Extremities:   Extremities normal, atraumatic, no cyanosis or edema  Pulses:   2+ and symmetric all extremities  Skin:   Skin color, texture, turgor normal, no rashes or lesions  Lymph nodes:   Cervical, supraclavicular, and axillary nodes normal  Neurologic:   CNII-XII intact, normal strength, sensation and reflexes    throughout      Assessment & Plan:   Problem List Items Addressed This Visit    Fear of flying    Alprazolam refilled,  The risks and benefits of benzodiazepine use were discussed with patient today including excessive sedation leading to respiratory depression,  impaired thinking/driving, and addiction.  Patient was  advised to avoid concurrent use with alcohol, to use medication only as needed and not to share with others  .       Relevant Medications   ALPRAZolam (XANAX) 0.25 MG tablet   Essential hypertension    Well controlled on current regimen. Renal function stable, no changes today.  Lab Results  Component Value Date   CREATININE 0.77 02/16/2018   Lab Results  Component Value Date   NA 137 02/16/2018   K 3.6 02/16/2018   CL 101 02/16/2018   CO2 28 02/16/2018         Encounter for preventive health examination    Annual comprehensive preventive exam was done as well as an evaluation and management of chronic conditions .  During the course of the visit the patient was educated and counseled about appropriate screening and preventive services including :  diabetes screening, lipid analysis with projected  10 year  risk for CAD , nutrition counseling, breast, cervical and colorectal  cancer screening, and recommended immunizations.  Printed recommendations for health maintenance screenings was given       Other Visit Diagnoses    Cervical cancer screening    -  Primary   Relevant Orders   Cytology - PAP      I have discontinued Makinsey T. Malbrough's diazepam. I am also having her start on ALPRAZolam, ondansetron, levofloxacin, and predniSONE. Additionally, I am having her maintain her multivitamin with minerals, EPINEPHrine, furosemide, clindamycin-benzoyl peroxide, amLODipine, omeprazole, and losartan.  Meds ordered this encounter  Medications  . ALPRAZolam (XANAX) 0.25 MG tablet    Sig: Take 1 tablet (0.25 mg total) by mouth 2 (two) times daily as needed for anxiety.    Dispense:  20 tablet    Refill:  0  . ondansetron (ZOFRAN ODT) 4 MG disintegrating tablet    Sig: Take 1 tablet (4 mg total) by mouth every 8 (eight) hours as needed for nausea or vomiting.    Dispense:  20 tablet    Refill:  0  . levofloxacin (LEVAQUIN) 500 MG tablet    Sig: Take 1 tablet (500 mg total) by mouth daily.    Dispense:  7 tablet    Refill:  0  . predniSONE (DELTASONE) 10 MG tablet    Sig: 6 tablets on Day 1 , then reduce by 1 tablet daily until gone    Dispense:  21 tablet    Refill:  0    Medications Discontinued During This Encounter  Medication Reason  . diazepam (VALIUM) 10 MG tablet Completed Course    Follow-up: No follow-ups on file.   Crecencio Mc, MD

## 2018-05-04 NOTE — Assessment & Plan Note (Signed)
Alprazolam refilled,  The risks and benefits of benzodiazepine use were discussed with patient today including excessive sedation leading to respiratory depression,  impaired thinking/driving, and addiction.  Patient was advised to avoid concurrent use with alcohol, to use medication only as needed and not to share with others  .

## 2018-05-04 NOTE — Assessment & Plan Note (Signed)
Well controlled on current regimen. Renal function stable, no changes today.  Lab Results  Component Value Date   CREATININE 0.77 02/16/2018   Lab Results  Component Value Date   NA 137 02/16/2018   K 3.6 02/16/2018   CL 101 02/16/2018   CO2 28 02/16/2018

## 2018-05-04 NOTE — Patient Instructions (Signed)

## 2018-05-04 NOTE — Assessment & Plan Note (Signed)
Annual comprehensive preventive exam was done as well as an evaluation and management of chronic conditions .  During the course of the visit the patient was educated and counseled about appropriate screening and preventive services including :  diabetes screening, lipid analysis with projected  10 year  risk for CAD , nutrition counseling, breast, cervical and colorectal cancer screening, and recommended immunizations.  Printed recommendations for health maintenance screenings was given 

## 2018-05-10 ENCOUNTER — Encounter: Payer: Self-pay | Admitting: Internal Medicine

## 2018-05-10 DIAGNOSIS — K219 Gastro-esophageal reflux disease without esophagitis: Secondary | ICD-10-CM

## 2018-05-19 MED ORDER — ALPRAZOLAM 0.25 MG PO TABS: 0.2500 mg | ORAL_TABLET | Freq: Two times a day (BID) | ORAL | 0 refills | Status: DC | PRN

## 2018-05-19 MED ORDER — FUROSEMIDE 20 MG PO TABS: 20.0000 mg | ORAL_TABLET | Freq: Every day | ORAL | 0 refills | Status: DC

## 2018-05-19 MED ORDER — OMEPRAZOLE 40 MG PO CPDR: 40.0000 mg | DELAYED_RELEASE_CAPSULE | Freq: Every day | ORAL | 0 refills | Status: DC

## 2018-05-19 MED ORDER — LOSARTAN POTASSIUM 100 MG PO TABS: 100.0000 mg | ORAL_TABLET | Freq: Every day | ORAL | 0 refills | Status: DC

## 2018-05-19 MED ORDER — AMLODIPINE BESYLATE 2.5 MG PO TABS: 2.5000 mg | ORAL_TABLET | Freq: Every day | ORAL | 0 refills | Status: DC

## 2018-05-19 MED ORDER — ONDANSETRON 4 MG PO TBDP: 4.0000 mg | ORAL_TABLET | Freq: Three times a day (TID) | ORAL | 0 refills | Status: DC | PRN

## 2018-06-11 ENCOUNTER — Other Ambulatory Visit (HOSPITAL_COMMUNITY)
Admission: RE | Admit: 2018-06-11 | Discharge: 2018-06-11 | Disposition: A | Discharge status: Home / Self Care | Payer: 59 | Source: Ambulatory Visit | Attending: Internal Medicine | Admitting: Internal Medicine

## 2018-06-11 ENCOUNTER — Encounter: Payer: Self-pay | Admitting: Internal Medicine

## 2018-06-11 ENCOUNTER — Ambulatory Visit: Payer: 59 | Admitting: Internal Medicine

## 2018-06-11 VITALS — BP 128/84 | HR 80 | Temp 98.2°F | Resp 15 | Ht 64.0 in | Wt 165.6 lb

## 2018-06-11 DIAGNOSIS — N926 Irregular menstruation, unspecified: Secondary | ICD-10-CM | POA: Diagnosis not present

## 2018-06-11 DIAGNOSIS — N921 Excessive and frequent menstruation with irregular cycle: Secondary | ICD-10-CM

## 2018-06-11 DIAGNOSIS — Z124 Encounter for screening for malignant neoplasm of cervix: Secondary | ICD-10-CM | POA: Insufficient documentation

## 2018-06-11 LAB — TSH: TSH: 1.81 u[IU]/mL (ref 0.35–4.50)

## 2018-06-11 NOTE — Progress Notes (Signed)
Subjective:  Patient ID: Autumn Martinez, female    DOB: 1976/05/05  Age: 42 y.o. MRN: [id number]  CC: The primary encounter diagnosis was Irregular menses. Diagnoses of Cervical cancer screening, Pap smear for cervical cancer screening, and Menometrorrhagia were also pertinent to this visit.  HPI Autumn Martinez presents for cervical cancer screening .   Outpatient Medications Prior to Visit  Medication Sig Dispense Refill  . amLODipine (NORVASC) 2.5 MG tablet Take 1 tablet (2.5 mg total) by mouth daily. 90 tablet 0  . clindamycin-benzoyl peroxide (BENZACLIN) gel APPLY TOPICALLY 2 TIMES DAILY. 35 g 1  . EPINEPHrine 0.3 mg/0.3 mL IJ SOAJ injection Inject 0.3 mLs (0.3 mg total) into the muscle once. (Patient taking differently: Inject 0.3 mg into the muscle once as needed. ) 1 Device 1  . furosemide (LASIX) 20 MG tablet Take 1 tablet (20 mg total) by mouth daily. As needed for edema 90 tablet 0  . losartan (COZAAR) 100 MG tablet Take 1 tablet (100 mg total) by mouth daily. 90 tablet 0  . Multiple Vitamins-Minerals (MULTIVITAMIN WITH MINERALS) tablet Take 1 tablet by mouth daily.    Marland Kitchen omeprazole (PRILOSEC) 40 MG capsule Take 1 capsule (40 mg total) by mouth daily. 90 capsule 0  . ALPRAZolam (XANAX) 0.25 MG tablet Take 1 tablet (0.25 mg total) by mouth 2 (two) times daily as needed for anxiety. (Patient not taking: Reported on 06/11/2018) 60 tablet 0  . levofloxacin (LEVAQUIN) 500 MG tablet Take 1 tablet (500 mg total) by mouth daily. (Patient not taking: Reported on 06/11/2018) 7 tablet 0  . ondansetron (ZOFRAN ODT) 4 MG disintegrating tablet Take 1 tablet (4 mg total) by mouth every 8 (eight) hours as needed for nausea or vomiting. (Patient not taking: Reported on 06/11/2018) 20 tablet 0  . predniSONE (DELTASONE) 10 MG tablet 6 tablets on Day 1 , then reduce by 1 tablet daily until gone (Patient not taking: Reported on 06/11/2018) 21 tablet 0   No facility-administered medications prior to visit.      Review of Systems;  Patient denies headache, fevers, malaise, unintentional weight loss, skin rash, eye pain, sinus congestion and sinus pain, sore throat, dysphagia,  hemoptysis , cough, dyspnea, wheezing, chest pain, palpitations, orthopnea, edema, abdominal pain, nausea, melena, diarrhea, constipation, flank pain, dysuria, hematuria, urinary  Frequency, nocturia, numbness, tingling, seizures,  Focal weakness, Loss of consciousness,  Tremor, insomnia, depression, anxiety, and suicidal ideation.      Objective:  BP 128/84 (BP Location: Left Arm, Patient Position: Sitting, Cuff Size: Normal)   Pulse 80   Temp 98.2 F (36.8 C) (Oral)   Resp 15   Ht 5\' 4"  (1.626 m)   Wt 165 lb 9.6 oz (75.1 kg)   SpO2 98%   BMI 28.43 kg/m   BP Readings from Last 3 Encounters:  06/11/18 128/84  05/04/18 134/88  04/14/18 (!) 142/87    Wt Readings from Last 3 Encounters:  06/11/18 165 lb 9.6 oz (75.1 kg)  05/04/18 161 lb 9.6 oz (73.3 kg)  04/14/18 146 lb (66.2 kg)    General Appearance:    Alert, cooperative, no distress, appears stated age  Head:    Normocephalic, without obvious abnormality, atraumatic     Neck:   Supple, symmetrical, trachea midline, no adenopathy;    thyroid:  no enlargement/tenderness/nodules; no carotid   bruit or JVD  Back:     Symmetric, no curvature, ROM normal, no CVA tenderness  Lungs:     Clear  to auscultation bilaterally, respirations unlabored  Chest Wall:    No tenderness or deformity   Heart:    Regular rate and rhythm, S1 and S2 normal, no murmur, rub   or gallop     Genitalia:    Pelvic: cervix normal in appearance, external genitalia normal, no adnexal masses or tenderness, no cervical motion tenderness, rectovaginal septum normal, uterus normal size, shape, and consistency and vagina normal without discharge  Extremities:   Extremities normal, atraumatic, no cyanosis or edema  Pulses:   2+ and symmetric all extremities  Skin:   Skin color, texture,  turgor normal, no rashes or lesions  Lymph nodes:   Cervical, supraclavicular, and axillary nodes normal  Neurologic:   CNII-XII intact, normal strength, sensation and reflexes    throughout   No results found for: HGBA1C  Lab Results  Component Value Date   CREATININE 0.77 02/16/2018   CREATININE 0.78 06/05/2017   CREATININE 0.90 08/30/2016    Lab Results  Component Value Date   WBC 7.1 06/05/2017   HGB 13.1 06/05/2017   HCT 39.6 06/05/2017   PLT 224.0 06/05/2017   GLUCOSE 82 02/16/2018   CHOL 163 06/05/2017   TRIG 71.0 06/05/2017   HDL 55.50 06/05/2017   LDLCALC 94 06/05/2017   ALT 10 02/16/2018   AST 19 02/16/2018   NA 137 02/16/2018   K 3.6 02/16/2018   CL 101 02/16/2018   CREATININE 0.77 02/16/2018   BUN 11 02/16/2018   CO2 28 02/16/2018   TSH 1.81 06/11/2018   INR 1.0 08/30/2016    Mm Clip Placement Left  Result Date: 04/21/2018 CLINICAL DATA:  Post biopsy mammogram of the left breast for clip placement. EXAM: DIAGNOSTIC LEFT MAMMOGRAM POST STEREOTACTIC BIOPSY COMPARISON:  Previous exam(s). FINDINGS: Mammographic images were obtained following stereotactic guided biopsy of calcifications in the upper-outer quadrant of the left breast. Coil shaped biopsy marking clip is well positioned at the site of biopsy in the upper-outer left breast. IMPRESSION: Appropriate positioning of the coil shaped biopsy marking clip in the upper-outer quadrant of the left breast. Final Assessment: Post Procedure Mammograms for Marker Placement Electronically Signed   By: Ammie Ferrier M.D.   On: 04/21/2018 14:29   Mm Lt Breast Bx W Loc Dev 1st Lesion Image Bx Spec Stereo Guide  Addendum Date: 04/22/2018   ADDENDUM REPORT: 04/22/2018 10:17 ADDENDUM: Pathology revealed FIBROCYSTIC CHANGE WITH CALCIFICATIONS of the Left breast, upper outer quadrant. This was found to be concordant by Dr. Ammie Ferrier. Pathology results were discussed with the patient by telephone. The patient reported  doing well after the biopsy with tenderness at the site. Post biopsy instructions and care were reviewed and questions were answered. The patient was encouraged to call [facility] for any additional concerns. The patient was instructed to return for annual screening mammography and informed a reminder notice would be sent regarding this appointment. Pathology results reported by Terie Purser, RN on 04/22/2018. Electronically Signed   By: Ammie Ferrier M.D.   On: 04/22/2018 10:17   Result Date: 04/22/2018 CLINICAL DATA:  42 year old female presenting for stereotactic biopsy of left breast calcifications. EXAM: LEFT BREAST STEREOTACTIC CORE NEEDLE BIOPSY COMPARISON:  Previous exams. FINDINGS: The patient and I discussed the procedure of stereotactic-guided biopsy including benefits and alternatives. We discussed the high likelihood of a successful procedure. We discussed the risks of the procedure including infection, bleeding, tissue injury, clip migration, and inadequate sampling. Informed written consent was given. The  usual time out protocol was performed immediately prior to the procedure. Using sterile technique and 1% Lidocaine as local anesthetic, under stereotactic guidance, a 9 gauge vacuum assisted device was used to perform core needle biopsy of calcifications in the upper-outer quadrant of the left breast using a superior approach. Specimen radiograph was performed showing calcifications within multiple core samples. Specimens with calcifications are identified for pathology. Lesion quadrant: Upper-outer quadrant At the conclusion of the procedure, a coil shaped tissue marker clip was deployed into the biopsy cavity. Follow-up 2-view mammogram was performed and dictated separately. IMPRESSION: Stereotactic-guided biopsy of calcifications in the upper-outer left breast. No apparent complications. Electronically Signed: By: Ammie Ferrier M.D. On: 04/21/2018 14:15     Assessment & Plan:   Problem List Items Addressed This Visit    Menometrorrhagia    Checking thyrroid function,  Which was normal   Lab Results  Component Value Date   TSH 1.81 06/11/2018         Pap smear for cervical cancer screening    PAP smear was done today with difficulty due to retroverted cervix       Other Visit Diagnoses    Irregular menses    -  Primary   Relevant Orders   TSH (Completed)   Cervical cancer screening       Relevant Orders   Cytology - PAP      I have discontinued Mahlet T. Murfin's levofloxacin, predniSONE, ALPRAZolam, and ondansetron. I am also having her maintain her multivitamin with minerals, EPINEPHrine, clindamycin-benzoyl peroxide, amLODipine, furosemide, omeprazole, and losartan.  No orders of the defined types were placed in this encounter.   Medications Discontinued During This Encounter  Medication Reason  . ALPRAZolam (XANAX) 0.25 MG tablet Patient has not taken in last 30 days  . levofloxacin (LEVAQUIN) 500 MG tablet Patient has not taken in last 30 days  . ondansetron (ZOFRAN ODT) 4 MG disintegrating tablet Patient has not taken in last 30 days  . predniSONE (DELTASONE) 10 MG tablet Patient has not taken in last 30 days    Follow-up: No follow-ups on file.   Crecencio Mc, MD

## 2018-06-13 NOTE — Assessment & Plan Note (Signed)
PAP smear was done today with difficulty due to retroverted cervix

## 2018-06-13 NOTE — Assessment & Plan Note (Signed)
Checking thyrroid function,  Which was normal   Lab Results  Component Value Date   TSH 1.81 06/11/2018

## 2018-06-15 LAB — CYTOLOGY - PAP
Diagnosis: NEGATIVE
HPV (WINDOPATH): NOT DETECTED

## 2018-07-20 ENCOUNTER — Other Ambulatory Visit: Payer: Self-pay | Admitting: Internal Medicine

## 2018-10-01 ENCOUNTER — Other Ambulatory Visit: Payer: Self-pay | Admitting: Internal Medicine

## 2018-10-01 DIAGNOSIS — K219 Gastro-esophageal reflux disease without esophagitis: Secondary | ICD-10-CM

## 2019-02-04 ENCOUNTER — Other Ambulatory Visit: Payer: Self-pay | Admitting: Internal Medicine

## 2019-02-04 DIAGNOSIS — G5702 Lesion of sciatic nerve, left lower limb: Secondary | ICD-10-CM | POA: Insufficient documentation

## 2019-02-04 MED ORDER — PREDNISONE 10 MG PO TABS: ORAL_TABLET | ORAL | 0 refills | Status: DC

## 2019-02-04 NOTE — Progress Notes (Signed)
My Chart message sent

## 2019-02-08 ENCOUNTER — Telehealth: Payer: Self-pay | Admitting: Internal Medicine

## 2019-02-08 MED ORDER — PREDNISONE 10 MG PO TABS: ORAL_TABLET | ORAL | 0 refills | Status: DC

## 2019-02-08 NOTE — Telephone Encounter (Signed)
Copied from [place] [id number]. Topic: Quick Communication - Rx Refill/Question >> Feb 08, 2019 12:51 PM Sheppard Coil, [employer] wrote: Medication:  Prednisone  Pt called left voicemail on [place] and states that a prescription for prednisone was to be called in for her but is not at correct pharmacy.  Pt needs this sent to [facility] outpatient pharmacy. Pt can be reached at [phone].

## 2019-02-08 NOTE — Telephone Encounter (Signed)
Sent to correct pharmacy. Pt is aware that medication was sent to correct pharmacy.

## 2019-02-14 ENCOUNTER — Other Ambulatory Visit: Payer: Self-pay

## 2019-02-15 ENCOUNTER — Other Ambulatory Visit: Payer: Self-pay | Admitting: Internal Medicine

## 2019-02-22 DIAGNOSIS — M7918 Myalgia, other site: Secondary | ICD-10-CM

## 2019-02-26 DIAGNOSIS — M5416 Radiculopathy, lumbar region: Secondary | ICD-10-CM | POA: Diagnosis not present

## 2019-03-01 ENCOUNTER — Other Ambulatory Visit: Payer: Self-pay | Admitting: Internal Medicine

## 2019-03-01 DIAGNOSIS — M9901 Segmental and somatic dysfunction of cervical region: Secondary | ICD-10-CM | POA: Diagnosis not present

## 2019-03-01 DIAGNOSIS — M50322 Other cervical disc degeneration at C5-C6 level: Secondary | ICD-10-CM | POA: Diagnosis not present

## 2019-03-01 DIAGNOSIS — M9903 Segmental and somatic dysfunction of lumbar region: Secondary | ICD-10-CM | POA: Diagnosis not present

## 2019-03-01 DIAGNOSIS — M5136 Other intervertebral disc degeneration, lumbar region: Secondary | ICD-10-CM | POA: Diagnosis not present

## 2019-03-01 MED ORDER — PREDNISONE 10 MG PO TABS: ORAL_TABLET | ORAL | 0 refills | Status: DC

## 2019-03-01 NOTE — Progress Notes (Signed)
Prednisone sent to [facility]

## 2019-03-02 NOTE — Progress Notes (Deleted)
[facility] [address] [place], [place] [address] Phone: [phone] Subjective:    I'm seeing this patient by the request  of:    CC:   [id number]  Autumn Martinez is a 43 y.o. female coming in with complaint of ***  Onset-  Location Duration-  Character- Aggravating factors- Reliving factors-  Therapies tried-  Severity-     Past Medical History:  Diagnosis Date  . Allergy   . Depression   . Heart murmur   . Hypertension    Past Surgical History:  Procedure Laterality Date  . BREAST BIOPSY Right 2018   benign  . CESAREAN SECTION  2002  . CESAREAN SECTION  2007  . CHOLECYSTECTOMY  2005  . TONSILLECTOMY AND ADENOIDECTOMY Bilateral 1980   Social History   Socioeconomic History  . Marital status: Married    Spouse name: Not on file  . Number of children: Not on file  . Years of education: Not on file  . Highest education level: Not on file  Occupational History  . Not on file  Social Needs  . Financial resource strain: Not on file  . Food insecurity:    Worry: Not on file    Inability: Not on file  . Transportation needs:    Medical: Not on file    Non-medical: Not on file  Tobacco Use  . Smoking status: Never Smoker  . Smokeless tobacco: Never Used  Substance and Sexual Activity  . Alcohol use: No  . Drug use: No  . Sexual activity: Yes  Lifestyle  . Physical activity:    Days per week: Not on file    Minutes per session: Not on file  . Stress: Not on file  Relationships  . Social connections:    Talks on phone: Not on file    Gets together: Not on file    Attends religious service: Not on file    Active member of club or organization: Not on file    Attends meetings of clubs or organizations: Not on file    Relationship status: Not on file  Other Topics Concern  . Not on file  Social History Narrative  . Not on file   Allergies  Allergen Reactions  . Butorphanol Tartrate   . Sulfonamide  Derivatives   . Hctz [Hydrochlorothiazide] Rash  . Other Rash   Family History  Problem Relation Age of Onset  . Hypertension Mother   . Hypertension Father   . Cancer Father   . Cancer Maternal Aunt   . Breast cancer Maternal Aunt 48  . Mental retardation Maternal Uncle     Current Outpatient Medications (Endocrine & Metabolic):  .  predniSONE (DELTASONE) 10 MG tablet, 6 tablets daily for 2 days,  reduce by 1 tablet every 2 days  until gone  Current Outpatient Medications (Cardiovascular):  .  amLODipine (NORVASC) 2.5 MG tablet, TAKE 1 TABLET BY MOUTH ONCE DAILY .  EPINEPHrine 0.3 mg/0.3 mL IJ SOAJ injection, Inject 0.3 mLs (0.3 mg total) into the muscle once. (Patient taking differently: Inject 0.3 mg into the muscle once as needed. ) .  furosemide (LASIX) 20 MG tablet, Take 1 tablet (20 mg total) by mouth daily. As needed for edema .  losartan (COZAAR) 100 MG tablet, TAKE 1 TABLET BY MOUTH ONCE DAILY     Current Outpatient Medications (Other):  .  clindamycin-benzoyl peroxide (BENZACLIN) gel, APPLY TOPICALLY 2 TIMES DAILY. .  Multiple Vitamins-Minerals (MULTIVITAMIN WITH  MINERALS) tablet, Take 1 tablet by mouth daily. Marland Kitchen  omeprazole (PRILOSEC) 40 MG capsule, TAKE 1 CAPSULE BY MOUTH ONCE DAILY    Past medical history, social, surgical and family history all reviewed in electronic medical record.  No pertanent information unless stated regarding to the chief complaint.   Review of Systems:  No headache, visual changes, nausea, vomiting, diarrhea, constipation, dizziness, abdominal pain, skin rash, fevers, chills, night sweats, weight loss, swollen lymph nodes, body aches, joint swelling, muscle aches, chest pain, shortness of breath, mood changes.   Objective  There were no vitals taken for this visit. Systems examined below as of    General: No apparent distress alert and oriented x3 mood and affect normal, dressed appropriately.  HEENT: Pupils equal, extraocular movements  intact  Respiratory: Patient's speak in full sentences and does not appear short of breath  Cardiovascular: No lower extremity edema, non tender, no erythema  Skin: Warm dry intact with no signs of infection or rash on extremities or on axial skeleton.  Abdomen: Soft nontender  Neuro: Cranial nerves II through XII are intact, neurovascularly intact in all extremities with 2+ DTRs and 2+ pulses.  Lymph: No lymphadenopathy of posterior or anterior cervical chain or axillae bilaterally.  Gait normal with good balance and coordination.  MSK:  Non tender with full range of motion and good stability and symmetric strength and tone of shoulders, elbows, wrist, hip, knee and ankles bilaterally.     Impression and Recommendations:     This case required medical decision making of moderate complexity. The above documentation has been reviewed and is accurate and complete Lyndal Pulley, DO       Note: This dictation was prepared with Dragon dictation along with smaller phrase technology. Any transcriptional errors that result from this process are unintentional.

## 2019-03-03 DIAGNOSIS — M9901 Segmental and somatic dysfunction of cervical region: Secondary | ICD-10-CM | POA: Diagnosis not present

## 2019-03-03 DIAGNOSIS — M9903 Segmental and somatic dysfunction of lumbar region: Secondary | ICD-10-CM | POA: Diagnosis not present

## 2019-03-03 DIAGNOSIS — M5136 Other intervertebral disc degeneration, lumbar region: Secondary | ICD-10-CM | POA: Diagnosis not present

## 2019-03-03 DIAGNOSIS — M50322 Other cervical disc degeneration at C5-C6 level: Secondary | ICD-10-CM | POA: Diagnosis not present

## 2019-03-04 ENCOUNTER — Encounter: Payer: Self-pay | Admitting: Internal Medicine

## 2019-03-04 ENCOUNTER — Ambulatory Visit: Payer: 59 | Admitting: Family Medicine

## 2019-03-04 DIAGNOSIS — M50322 Other cervical disc degeneration at C5-C6 level: Secondary | ICD-10-CM | POA: Diagnosis not present

## 2019-03-04 DIAGNOSIS — M9903 Segmental and somatic dysfunction of lumbar region: Secondary | ICD-10-CM | POA: Diagnosis not present

## 2019-03-04 DIAGNOSIS — M5136 Other intervertebral disc degeneration, lumbar region: Secondary | ICD-10-CM | POA: Diagnosis not present

## 2019-03-04 DIAGNOSIS — M9901 Segmental and somatic dysfunction of cervical region: Secondary | ICD-10-CM | POA: Diagnosis not present

## 2019-03-05 ENCOUNTER — Other Ambulatory Visit: Payer: Self-pay

## 2019-03-05 ENCOUNTER — Emergency Department
Admission: EM | Admit: 2019-03-05 | Discharge: 2019-03-05 | Disposition: A | Discharge status: Home / Self Care | Payer: 59 | Attending: Emergency Medicine | Admitting: Emergency Medicine

## 2019-03-05 DIAGNOSIS — M79605 Pain in left leg: Secondary | ICD-10-CM | POA: Diagnosis not present

## 2019-03-05 DIAGNOSIS — M5432 Sciatica, left side: Secondary | ICD-10-CM | POA: Diagnosis not present

## 2019-03-05 MED ORDER — ONDANSETRON 4 MG PO TBDP
4.0000 mg | ORAL_TABLET | Freq: Once | ORAL | Status: AC
  Administered 2019-03-05: 22:00:00 4 mg via ORAL
  Filled 2019-03-05: qty 1

## 2019-03-05 MED ORDER — OXYCODONE-ACETAMINOPHEN 5-325 MG PO TABS: 1.0000 | ORAL_TABLET | ORAL | 0 refills | Status: DC | PRN

## 2019-03-05 MED ORDER — HYDROMORPHONE HCL 1 MG/ML IJ SOLN
1.0000 mg | Freq: Once | INTRAMUSCULAR | Status: AC
  Administered 2019-03-05: 1 mg via INTRAMUSCULAR
  Filled 2019-03-05: qty 1

## 2019-03-05 NOTE — ED Triage Notes (Signed)
Patient c/o left leg pain originating in left buttocks and radiating down leg. Patient seen at [facility] for same and dx with L5 nerve impingement. Patient dx with, prednisone taper, robaxin and flexeril. Patient denies relief of symptoms

## 2019-03-05 NOTE — ED Notes (Signed)
Patient presents for left leg pain due to an L5 impingement. States she cannot bend, stand or lay flat. 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

## 2019-03-05 NOTE — ED Provider Notes (Signed)
[facility] Emergency Department Provider Note  Time seen: 9:38 PM  I have reviewed the triage vital signs and the nursing notes.   HISTORY  Chief Complaint Leg Pain   HPI Autumn Martinez is a 43 y.o. female with a past medical history of hypertension, presents to the emergency department for left leg pain.  According to the patient over the past 4 weeks she has been experiencing intermittent pain in the left leg, describes it most as a cramping sensation in her left buttocks with occasional shooting pains down the left leg.  Patient has seen orthopedics at [facility], has had x-rays performed, has been diagnosed with a left L5 nerve root impingement.  Patient was placed on prednisone and Flexeril, took her first dose of prednisone today.  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  Patient denies any numbness of the leg, denies any bladder or bowel incontinence.    Past Medical History:  Diagnosis Date  . Allergy   . Depression   . Heart murmur   . Hypertension     Patient Active Problem List   Diagnosis Date Noted  . Pyriformis syndrome, left 02/04/2019  . Fear of flying 05/04/2018  . GERD (gastroesophageal reflux disease) 02/17/2018  . PMDD (premenstrual dysphoric disorder) 06/20/2016  . PVC (premature ventricular contraction) 07/20/2015  . Encounter for preventive health examination 10/25/2014  . Pap smear for cervical cancer screening 10/25/2014  . Food allergy 10/25/2014  . Heart murmur, systolic 99/83/3825  . Fatigue 05/18/2013  . Menometrorrhagia 05/18/2013  . PTSD (post-traumatic stress disorder) 05/18/2013  . Essential hypertension 02/16/2009    Past Surgical History:  Procedure Laterality Date  . BREAST BIOPSY Right 2018   benign  . CESAREAN SECTION  2002  . CESAREAN  SECTION  2007  . CHOLECYSTECTOMY  2005  . TONSILLECTOMY AND ADENOIDECTOMY Bilateral 1980    Prior to Admission medications   Medication Sig Start Date End Date Taking? Authorizing Provider  amLODipine (NORVASC) 2.5 MG tablet TAKE 1 TABLET BY MOUTH ONCE DAILY 02/15/19   Crecencio Mc, MD  clindamycin-benzoyl peroxide (BENZACLIN) gel APPLY TOPICALLY 2 TIMES DAILY. 02/16/18   Crecencio Mc, MD  EPINEPHrine 0.3 mg/0.3 mL IJ SOAJ injection Inject 0.3 mLs (0.3 mg total) into the muscle once. Patient taking differently: Inject 0.3 mg into the muscle once as needed.  10/25/14   Crecencio Mc, MD  furosemide (LASIX) 20 MG tablet Take 1 tablet (20 mg total) by mouth daily. As needed for edema 05/19/18   Crecencio Mc, MD  losartan (COZAAR) 100 MG tablet TAKE 1 TABLET BY MOUTH ONCE DAILY 02/15/19   Crecencio Mc, MD  Multiple Vitamins-Minerals (MULTIVITAMIN WITH MINERALS) tablet Take 1 tablet by mouth daily.    [provider]  omeprazole (PRILOSEC) 40 MG capsule TAKE 1 CAPSULE BY MOUTH ONCE DAILY 10/01/18   Crecencio Mc, MD  predniSONE (DELTASONE) 10 MG tablet 6 tablets daily for 2 days,  reduce by 1 tablet every 2 days  until gone 03/01/19   Crecencio Mc, MD    Allergies  Allergen Reactions  . Butorphanol Tartrate   . Sulfonamide Derivatives   . Hctz [Hydrochlorothiazide] Rash  . Other Rash    Family History  Problem Relation Age of Onset  . Hypertension Mother   .  Hypertension Father   . Cancer Father   . Cancer Maternal Aunt   . Breast cancer Maternal Aunt 48  . Mental retardation Maternal Uncle     Social History Social History   Tobacco Use  . Smoking status: Never Smoker  . Smokeless tobacco: Never Used  Substance Use Topics  . Alcohol use: No  . Drug use: No    Review of Systems Constitutional: Negative for fever. Cardiovascular: Negative for chest pain. Respiratory: Negative for shortness of breath. Gastrointestinal: Negative for abdominal  pain Genitourinary: Negative for urinary incontinence. Musculoskeletal: Negative for musculoskeletal complaints Neurological: Negative for headache.  Denies weakness. All other ROS negative  ____________________________________________   PHYSICAL EXAM:  VITAL SIGNS: ED Triage Vitals  Enc Vitals Group     BP 03/05/19 2105 (!) 152/95     Pulse Rate 03/05/19 2105 (!) 144     Resp 03/05/19 2105 (!) 22     Temp 03/05/19 2105 98.1 F (36.7 C)     Temp src --      SpO2 03/05/19 2105 100 %     Weight 03/05/19 2057 150 lb (68 kg)     Height 03/05/19 2057 5\' 4"  (1.626 m)     Head Circumference --      Peak Flow --      Pain Score 03/05/19 2057 10     Pain Loc --      Pain Edu? --      Excl. in [place]? --    Constitutional: Alert and oriented. Well appearing and in no distress. Eyes: Normal exam ENT      Head: Normocephalic and atraumatic.      Mouth/Throat: Mucous membranes are moist. Cardiovascular: Normal rate, regular rhythm.  Respiratory: Normal respiratory effort without tachypnea nor retractions. Breath sounds are clear Gastrointestinal: Soft and nontender. No distention.   Musculoskeletal: Nontender with normal range of motion in all extremities. Neurologic:  Normal speech and language. No gross focal neurologic deficits Skin:  Skin is warm, dry and intact.  Psychiatric: Mood and affect are normal.  ____________________________________________  INITIAL IMPRESSION / ASSESSMENT AND PLAN / ED COURSE  Pertinent labs & imaging results that were available during my care of the patient were reviewed by me and considered in my medical decision making (see chart for details).   Patient presents emergency department for left lower back pain radiating down the left leg.  Symptoms are very consistent with sciatica/radicular pain.  Foot is neurovascularly intact distally, with intact sensation and 2+ DP pulse.  Differential would include radicular pain, sciatica, musculoskeletal pain.   Patient has a plan to follow-up with orthopedics for repeat evaluation and possible MRI.  At this time at the patient symptoms are very consistent with sciatica/radicular pain I do not believe emergent imaging is necessary at this time.  Patient just started prednisone today.  We will attempt to pain control in the emergency department.  Patient agreeable to plan of care.  States pain is down to a 3 or 4/10.  We will discharge the patient home with Percocet for pain control and have the patient follow-up with her orthopedist.  Venita Lick was evaluated in Emergency Department on 03/05/2019 for the symptoms described in the history of present illness. She was evaluated in the context of the global COVID-19 pandemic, which necessitated consideration that the patient might be at risk for infection with the SARS-CoV-2 virus that causes COVID-19. Institutional protocols and algorithms that pertain to the evaluation of patients at  risk for COVID-19 are in a state of rapid change based on information released by regulatory bodies including the CDC and federal and state organizations. These policies and algorithms were followed during the patient's care in the ED.  ____________________________________________   FINAL CLINICAL IMPRESSION(S) / ED DIAGNOSES  Sciatica   Harvest Dark, MD 03/05/19 2252

## 2019-03-06 DIAGNOSIS — M5386 Other specified dorsopathies, lumbar region: Secondary | ICD-10-CM

## 2019-03-10 ENCOUNTER — Other Ambulatory Visit: Payer: Self-pay | Admitting: Internal Medicine

## 2019-03-10 ENCOUNTER — Telehealth: Payer: Self-pay | Admitting: Internal Medicine

## 2019-03-10 MED ORDER — HYDROCODONE-ACETAMINOPHEN 10-325 MG PO TABS
1.0000 | ORAL_TABLET | Freq: Four times a day (QID) | ORAL | 0 refills | Status: DC | PRN
Start: 2019-03-10 — End: 2019-03-11

## 2019-03-10 NOTE — Progress Notes (Signed)
rx for vicodin sent to [facility]

## 2019-03-10 NOTE — Telephone Encounter (Signed)
Copied from [place] [id number]. Topic: Quick Communication - See Telephone Encounter >> Mar 10, 2019  4:19 PM Ivar Drape wrote: CRM for notification. See Telephone encounter for: 03/10/19. Patient's HYDROcodone-acetaminophen (NORCO) 10-325 MG tablet medication was sent to [facility] on [address] [facility] by mistake.  It should have gone to her preferred pharmacy [place].  Please resend.

## 2019-03-11 DIAGNOSIS — M545 Low back pain: Secondary | ICD-10-CM | POA: Diagnosis not present

## 2019-03-11 DIAGNOSIS — M5136 Other intervertebral disc degeneration, lumbar region: Secondary | ICD-10-CM | POA: Diagnosis not present

## 2019-03-11 MED ORDER — HYDROCODONE-ACETAMINOPHEN 10-325 MG PO TABS: 1.0000 | ORAL_TABLET | Freq: Four times a day (QID) | ORAL | 0 refills | Status: DC | PRN

## 2019-03-11 NOTE — Telephone Encounter (Signed)
I called [facility] and  The pharmacist states they did not receive the RX , she stated that they did not fill it.  Jadelin Eng,cma

## 2019-03-11 NOTE — Telephone Encounter (Signed)
Pt states he medication Norco was sent to wrong pharmacy.Patient's HYDROcodone-acetaminophen (NORCO) 10-325 MG tablet medication was sent to [facility] on [address] [facility] by mistake.  It should have gone to her preferred pharmacy [place].  Please resend.

## 2019-03-11 NOTE — Telephone Encounter (Signed)
Resent to [facility].  PLEASE CALL [facility] AND TELL THEM NOT TO FILL THE [place]

## 2019-03-12 ENCOUNTER — Other Ambulatory Visit: Payer: Self-pay

## 2019-03-12 ENCOUNTER — Ambulatory Visit
Admission: RE | Admit: 2019-03-12 | Discharge: 2019-03-12 | Disposition: A | Discharge status: Home / Self Care | Payer: 59 | Source: Ambulatory Visit | Attending: Internal Medicine | Admitting: Internal Medicine

## 2019-03-12 DIAGNOSIS — M5386 Other specified dorsopathies, lumbar region: Secondary | ICD-10-CM

## 2019-03-15 DIAGNOSIS — M9903 Segmental and somatic dysfunction of lumbar region: Secondary | ICD-10-CM | POA: Diagnosis not present

## 2019-03-15 DIAGNOSIS — M9901 Segmental and somatic dysfunction of cervical region: Secondary | ICD-10-CM | POA: Diagnosis not present

## 2019-03-15 DIAGNOSIS — M5136 Other intervertebral disc degeneration, lumbar region: Secondary | ICD-10-CM | POA: Diagnosis not present

## 2019-03-15 DIAGNOSIS — M50322 Other cervical disc degeneration at C5-C6 level: Secondary | ICD-10-CM | POA: Diagnosis not present

## 2019-03-16 DIAGNOSIS — M5442 Lumbago with sciatica, left side: Secondary | ICD-10-CM | POA: Diagnosis not present

## 2019-03-17 DIAGNOSIS — M9901 Segmental and somatic dysfunction of cervical region: Secondary | ICD-10-CM | POA: Diagnosis not present

## 2019-03-17 DIAGNOSIS — M9903 Segmental and somatic dysfunction of lumbar region: Secondary | ICD-10-CM | POA: Diagnosis not present

## 2019-03-17 DIAGNOSIS — M5136 Other intervertebral disc degeneration, lumbar region: Secondary | ICD-10-CM | POA: Diagnosis not present

## 2019-03-17 DIAGNOSIS — M50322 Other cervical disc degeneration at C5-C6 level: Secondary | ICD-10-CM | POA: Diagnosis not present

## 2019-03-18 ENCOUNTER — Other Ambulatory Visit: Payer: Self-pay

## 2019-03-18 ENCOUNTER — Ambulatory Visit
Admission: RE | Admit: 2019-03-18 | Discharge: 2019-03-18 | Disposition: A | Discharge status: Home / Self Care | Payer: 59 | Source: Ambulatory Visit | Attending: Internal Medicine | Admitting: Internal Medicine

## 2019-03-18 DIAGNOSIS — M4696 Unspecified inflammatory spondylopathy, lumbar region: Secondary | ICD-10-CM | POA: Diagnosis not present

## 2019-03-18 DIAGNOSIS — M2578 Osteophyte, vertebrae: Secondary | ICD-10-CM | POA: Diagnosis not present

## 2019-03-18 DIAGNOSIS — M5386 Other specified dorsopathies, lumbar region: Secondary | ICD-10-CM | POA: Insufficient documentation

## 2019-03-22 DIAGNOSIS — M9901 Segmental and somatic dysfunction of cervical region: Secondary | ICD-10-CM | POA: Diagnosis not present

## 2019-03-22 DIAGNOSIS — M9903 Segmental and somatic dysfunction of lumbar region: Secondary | ICD-10-CM | POA: Diagnosis not present

## 2019-03-22 DIAGNOSIS — M5136 Other intervertebral disc degeneration, lumbar region: Secondary | ICD-10-CM | POA: Diagnosis not present

## 2019-03-22 DIAGNOSIS — M50322 Other cervical disc degeneration at C5-C6 level: Secondary | ICD-10-CM | POA: Diagnosis not present

## 2019-03-24 DIAGNOSIS — M9901 Segmental and somatic dysfunction of cervical region: Secondary | ICD-10-CM | POA: Diagnosis not present

## 2019-03-24 DIAGNOSIS — M50322 Other cervical disc degeneration at C5-C6 level: Secondary | ICD-10-CM | POA: Diagnosis not present

## 2019-03-24 DIAGNOSIS — M9903 Segmental and somatic dysfunction of lumbar region: Secondary | ICD-10-CM | POA: Diagnosis not present

## 2019-03-24 DIAGNOSIS — M5136 Other intervertebral disc degeneration, lumbar region: Secondary | ICD-10-CM | POA: Diagnosis not present

## 2019-03-25 DIAGNOSIS — M545 Low back pain: Secondary | ICD-10-CM | POA: Diagnosis not present

## 2019-03-25 DIAGNOSIS — M5136 Other intervertebral disc degeneration, lumbar region: Secondary | ICD-10-CM | POA: Diagnosis not present

## 2019-03-31 DIAGNOSIS — M9903 Segmental and somatic dysfunction of lumbar region: Secondary | ICD-10-CM | POA: Diagnosis not present

## 2019-03-31 DIAGNOSIS — M50322 Other cervical disc degeneration at C5-C6 level: Secondary | ICD-10-CM | POA: Diagnosis not present

## 2019-03-31 DIAGNOSIS — M5136 Other intervertebral disc degeneration, lumbar region: Secondary | ICD-10-CM | POA: Diagnosis not present

## 2019-03-31 DIAGNOSIS — M9901 Segmental and somatic dysfunction of cervical region: Secondary | ICD-10-CM | POA: Diagnosis not present

## 2019-04-07 DIAGNOSIS — M5136 Other intervertebral disc degeneration, lumbar region: Secondary | ICD-10-CM | POA: Diagnosis not present

## 2019-04-07 DIAGNOSIS — M9903 Segmental and somatic dysfunction of lumbar region: Secondary | ICD-10-CM | POA: Diagnosis not present

## 2019-04-07 DIAGNOSIS — M9901 Segmental and somatic dysfunction of cervical region: Secondary | ICD-10-CM | POA: Diagnosis not present

## 2019-04-07 DIAGNOSIS — M50322 Other cervical disc degeneration at C5-C6 level: Secondary | ICD-10-CM | POA: Diagnosis not present

## 2019-04-12 ENCOUNTER — Other Ambulatory Visit: Payer: Self-pay | Admitting: Internal Medicine

## 2019-04-12 MED ORDER — FUROSEMIDE 20 MG PO TABS: 20.0000 mg | ORAL_TABLET | Freq: Every day | ORAL | 0 refills | Status: DC

## 2019-04-12 NOTE — Progress Notes (Signed)
fur

## 2019-04-14 ENCOUNTER — Other Ambulatory Visit: Payer: Self-pay | Admitting: Internal Medicine

## 2019-04-14 DIAGNOSIS — K219 Gastro-esophageal reflux disease without esophagitis: Secondary | ICD-10-CM

## 2019-04-15 DIAGNOSIS — M545 Low back pain: Secondary | ICD-10-CM | POA: Diagnosis not present

## 2019-04-15 DIAGNOSIS — M5136 Other intervertebral disc degeneration, lumbar region: Secondary | ICD-10-CM | POA: Diagnosis not present

## 2019-04-19 ENCOUNTER — Other Ambulatory Visit: Payer: Self-pay

## 2019-04-19 MED ORDER — FUROSEMIDE 20 MG PO TABS: 20.0000 mg | ORAL_TABLET | Freq: Every day | ORAL | 0 refills | Status: DC

## 2019-04-22 DIAGNOSIS — M50322 Other cervical disc degeneration at C5-C6 level: Secondary | ICD-10-CM | POA: Diagnosis not present

## 2019-04-22 DIAGNOSIS — M9901 Segmental and somatic dysfunction of cervical region: Secondary | ICD-10-CM | POA: Diagnosis not present

## 2019-04-22 DIAGNOSIS — M9903 Segmental and somatic dysfunction of lumbar region: Secondary | ICD-10-CM | POA: Diagnosis not present

## 2019-04-22 DIAGNOSIS — M5136 Other intervertebral disc degeneration, lumbar region: Secondary | ICD-10-CM | POA: Diagnosis not present

## 2019-04-29 DIAGNOSIS — M545 Low back pain: Secondary | ICD-10-CM | POA: Diagnosis not present

## 2019-04-29 DIAGNOSIS — M5136 Other intervertebral disc degeneration, lumbar region: Secondary | ICD-10-CM | POA: Diagnosis not present

## 2019-05-06 DIAGNOSIS — M5136 Other intervertebral disc degeneration, lumbar region: Secondary | ICD-10-CM | POA: Diagnosis not present

## 2019-05-06 DIAGNOSIS — M50322 Other cervical disc degeneration at C5-C6 level: Secondary | ICD-10-CM | POA: Diagnosis not present

## 2019-05-06 DIAGNOSIS — M9901 Segmental and somatic dysfunction of cervical region: Secondary | ICD-10-CM | POA: Diagnosis not present

## 2019-05-06 DIAGNOSIS — M9903 Segmental and somatic dysfunction of lumbar region: Secondary | ICD-10-CM | POA: Diagnosis not present

## 2019-05-18 MED ORDER — HYDROCODONE-ACETAMINOPHEN 10-325 MG PO TABS: 1.0000 | ORAL_TABLET | Freq: Four times a day (QID) | ORAL | 0 refills | Status: DC | PRN

## 2019-05-18 MED ORDER — GABAPENTIN 300 MG PO CAPS
300.0000 mg | ORAL_CAPSULE | Freq: Two times a day (BID) | ORAL | 0 refills | Status: DC
Start: 2019-05-18 — End: 2019-07-22

## 2019-05-20 DIAGNOSIS — M5136 Other intervertebral disc degeneration, lumbar region: Secondary | ICD-10-CM | POA: Diagnosis not present

## 2019-05-20 DIAGNOSIS — M50322 Other cervical disc degeneration at C5-C6 level: Secondary | ICD-10-CM | POA: Diagnosis not present

## 2019-05-20 DIAGNOSIS — M9903 Segmental and somatic dysfunction of lumbar region: Secondary | ICD-10-CM | POA: Diagnosis not present

## 2019-05-20 DIAGNOSIS — M9901 Segmental and somatic dysfunction of cervical region: Secondary | ICD-10-CM | POA: Diagnosis not present

## 2019-05-24 ENCOUNTER — Other Ambulatory Visit: Payer: Self-pay | Admitting: Internal Medicine

## 2019-05-27 DIAGNOSIS — M9903 Segmental and somatic dysfunction of lumbar region: Secondary | ICD-10-CM | POA: Diagnosis not present

## 2019-05-27 DIAGNOSIS — M50322 Other cervical disc degeneration at C5-C6 level: Secondary | ICD-10-CM | POA: Diagnosis not present

## 2019-05-27 DIAGNOSIS — M9901 Segmental and somatic dysfunction of cervical region: Secondary | ICD-10-CM | POA: Diagnosis not present

## 2019-05-27 DIAGNOSIS — M5136 Other intervertebral disc degeneration, lumbar region: Secondary | ICD-10-CM | POA: Diagnosis not present

## 2019-06-10 DIAGNOSIS — M50322 Other cervical disc degeneration at C5-C6 level: Secondary | ICD-10-CM | POA: Diagnosis not present

## 2019-06-10 DIAGNOSIS — M5136 Other intervertebral disc degeneration, lumbar region: Secondary | ICD-10-CM | POA: Diagnosis not present

## 2019-06-10 DIAGNOSIS — M9901 Segmental and somatic dysfunction of cervical region: Secondary | ICD-10-CM | POA: Diagnosis not present

## 2019-06-10 DIAGNOSIS — M9903 Segmental and somatic dysfunction of lumbar region: Secondary | ICD-10-CM | POA: Diagnosis not present

## 2019-06-17 DIAGNOSIS — Z135 Encounter for screening for eye and ear disorders: Secondary | ICD-10-CM | POA: Diagnosis not present

## 2019-06-17 DIAGNOSIS — H524 Presbyopia: Secondary | ICD-10-CM | POA: Diagnosis not present

## 2019-06-24 DIAGNOSIS — M50322 Other cervical disc degeneration at C5-C6 level: Secondary | ICD-10-CM | POA: Diagnosis not present

## 2019-06-24 DIAGNOSIS — M9901 Segmental and somatic dysfunction of cervical region: Secondary | ICD-10-CM | POA: Diagnosis not present

## 2019-06-24 DIAGNOSIS — M5136 Other intervertebral disc degeneration, lumbar region: Secondary | ICD-10-CM | POA: Diagnosis not present

## 2019-06-24 DIAGNOSIS — M9903 Segmental and somatic dysfunction of lumbar region: Secondary | ICD-10-CM | POA: Diagnosis not present

## 2019-06-27 ENCOUNTER — Other Ambulatory Visit: Payer: Self-pay

## 2019-06-28 ENCOUNTER — Other Ambulatory Visit: Payer: Self-pay | Admitting: Internal Medicine

## 2019-06-28 MED ORDER — LOSARTAN POTASSIUM 100 MG PO TABS: 100.0000 mg | ORAL_TABLET | Freq: Every day | ORAL | 0 refills | Status: DC

## 2019-07-11 ENCOUNTER — Other Ambulatory Visit: Payer: Self-pay

## 2019-07-12 ENCOUNTER — Other Ambulatory Visit: Payer: Self-pay | Admitting: Internal Medicine

## 2019-07-12 MED ORDER — CLINDAMYCIN PHOS-BENZOYL PEROX 1-5 % EX GEL: CUTANEOUS | 5 refills | Status: DC

## 2019-07-12 MED ORDER — FUROSEMIDE 20 MG PO TABS: 20.0000 mg | ORAL_TABLET | Freq: Every day | ORAL | 0 refills | Status: DC

## 2019-07-15 ENCOUNTER — Other Ambulatory Visit: Payer: Self-pay | Admitting: Internal Medicine

## 2019-07-15 DIAGNOSIS — Z1231 Encounter for screening mammogram for malignant neoplasm of breast: Secondary | ICD-10-CM

## 2019-07-21 ENCOUNTER — Other Ambulatory Visit: Payer: Self-pay

## 2019-07-22 ENCOUNTER — Other Ambulatory Visit: Payer: Self-pay

## 2019-07-22 ENCOUNTER — Other Ambulatory Visit: Payer: Self-pay | Admitting: Internal Medicine

## 2019-07-29 DIAGNOSIS — M5136 Other intervertebral disc degeneration, lumbar region: Secondary | ICD-10-CM | POA: Diagnosis not present

## 2019-07-29 DIAGNOSIS — M50322 Other cervical disc degeneration at C5-C6 level: Secondary | ICD-10-CM | POA: Diagnosis not present

## 2019-07-29 DIAGNOSIS — M9903 Segmental and somatic dysfunction of lumbar region: Secondary | ICD-10-CM | POA: Diagnosis not present

## 2019-07-29 DIAGNOSIS — M9901 Segmental and somatic dysfunction of cervical region: Secondary | ICD-10-CM | POA: Diagnosis not present

## 2019-08-03 ENCOUNTER — Other Ambulatory Visit: Payer: Self-pay

## 2019-08-03 ENCOUNTER — Ambulatory Visit
Admission: RE | Admit: 2019-08-03 | Discharge: 2019-08-03 | Disposition: A | Discharge status: Home / Self Care | Payer: 59 | Source: Ambulatory Visit | Attending: Internal Medicine | Admitting: Internal Medicine

## 2019-08-03 ENCOUNTER — Encounter: Payer: 59 | Admitting: Internal Medicine

## 2019-08-03 DIAGNOSIS — Z1231 Encounter for screening mammogram for malignant neoplasm of breast: Secondary | ICD-10-CM

## 2019-08-04 ENCOUNTER — Other Ambulatory Visit: Payer: Self-pay | Admitting: Internal Medicine

## 2019-08-04 DIAGNOSIS — R928 Other abnormal and inconclusive findings on diagnostic imaging of breast: Secondary | ICD-10-CM

## 2019-08-12 ENCOUNTER — Ambulatory Visit: Payer: 59

## 2019-08-12 ENCOUNTER — Other Ambulatory Visit: Payer: Self-pay

## 2019-08-12 ENCOUNTER — Ambulatory Visit
Admission: RE | Admit: 2019-08-12 | Discharge: 2019-08-12 | Disposition: A | Discharge status: Home / Self Care | Payer: 59 | Source: Ambulatory Visit | Attending: Internal Medicine | Admitting: Internal Medicine

## 2019-08-12 DIAGNOSIS — R922 Inconclusive mammogram: Secondary | ICD-10-CM | POA: Diagnosis not present

## 2019-08-12 DIAGNOSIS — R928 Other abnormal and inconclusive findings on diagnostic imaging of breast: Secondary | ICD-10-CM

## 2019-09-02 ENCOUNTER — Other Ambulatory Visit: Payer: Self-pay | Admitting: Internal Medicine

## 2019-09-21 ENCOUNTER — Other Ambulatory Visit: Payer: Self-pay

## 2019-09-22 ENCOUNTER — Other Ambulatory Visit: Payer: Self-pay | Admitting: Internal Medicine

## 2019-09-23 ENCOUNTER — Other Ambulatory Visit: Payer: Self-pay

## 2019-09-23 ENCOUNTER — Ambulatory Visit (INDEPENDENT_AMBULATORY_CARE_PROVIDER_SITE_OTHER): Payer: 59 | Admitting: Internal Medicine

## 2019-09-23 ENCOUNTER — Encounter: Payer: Self-pay | Admitting: Internal Medicine

## 2019-09-23 VITALS — BP 130/80 | HR 79 | Temp 98.4°F | Ht 64.0 in | Wt 173.2 lb

## 2019-09-23 DIAGNOSIS — G5702 Lesion of sciatic nerve, left lower limb: Secondary | ICD-10-CM

## 2019-09-23 DIAGNOSIS — R7303 Prediabetes: Secondary | ICD-10-CM

## 2019-09-23 DIAGNOSIS — Z833 Family history of diabetes mellitus: Secondary | ICD-10-CM

## 2019-09-23 DIAGNOSIS — I10 Essential (primary) hypertension: Secondary | ICD-10-CM | POA: Diagnosis not present

## 2019-09-23 DIAGNOSIS — Z0000 Encounter for general adult medical examination without abnormal findings: Secondary | ICD-10-CM

## 2019-09-23 DIAGNOSIS — R5383 Other fatigue: Secondary | ICD-10-CM | POA: Diagnosis not present

## 2019-09-23 DIAGNOSIS — D649 Anemia, unspecified: Secondary | ICD-10-CM | POA: Diagnosis not present

## 2019-09-23 LAB — COMPREHENSIVE METABOLIC PANEL
ALT: 15 U/L (ref 0–35)
AST: 21 U/L (ref 0–37)
Albumin: 4.2 g/dL (ref 3.5–5.2)
Alkaline Phosphatase: 79 U/L (ref 39–117)
BUN: 11 mg/dL (ref 6–23)
CO2: 26 mEq/L (ref 19–32)
Calcium: 8.9 mg/dL (ref 8.4–10.5)
Chloride: 103 mEq/L (ref 96–112)
Creatinine, Ser: 0.71 mg/dL (ref 0.40–1.20)
GFR: 88.85 mL/min (ref >60.00)
Glucose, Bld: 91 mg/dL (ref 70–99)
Potassium: 3.6 mEq/L (ref 3.5–5.1)
Sodium: 138 mEq/L (ref 135–145)
Total Bilirubin: 0.5 mg/dL (ref 0.2–1.2)
Total Protein: 7.6 g/dL (ref 6.0–8.3)

## 2019-09-23 LAB — CBC WITH DIFFERENTIAL/PLATELET
Basophils Absolute: 0 10*3/uL (ref 0.0–0.1)
Basophils Relative: 0.4 % (ref 0.0–3.0)
Eosinophils Absolute: 0.1 10*3/uL (ref 0.0–0.7)
Eosinophils Relative: 1.7 % (ref 0.0–5.0)
HCT: 33 % — ABNORMAL LOW (ref 36.0–46.0)
Hemoglobin: 10.5 g/dL — ABNORMAL LOW (ref 12.0–15.0)
Lymphocytes Relative: 31.4 % (ref 12.0–46.0)
Lymphs Abs: 2.3 10*3/uL (ref 0.7–4.0)
MCHC: 31.7 g/dL (ref 30.0–36.0)
MCV: 78.7 fl (ref 78.0–100.0)
Monocytes Absolute: 0.6 10*3/uL (ref 0.1–1.0)
Monocytes Relative: 7.5 % (ref 3.0–12.0)
Neutro Abs: 4.4 10*3/uL (ref 1.4–7.7)
Neutrophils Relative %: 59 % (ref 43.0–77.0)
Platelets: 345 10*3/uL (ref 150.0–400.0)
RBC: 4.2 Mil/uL (ref 3.87–5.11)
RDW: 16.7 % — ABNORMAL HIGH (ref 11.5–15.5)
WBC: 7.5 10*3/uL (ref 4.0–10.5)

## 2019-09-23 LAB — LIPID PANEL
Cholesterol: 197 mg/dL (ref 0–200)
HDL: 53.2 mg/dL (ref >39.00)
LDL Cholesterol: 119 mg/dL — ABNORMAL HIGH (ref 0–99)
NonHDL: 144.23
Total CHOL/HDL Ratio: 4
Triglycerides: 126 mg/dL (ref 0.0–149.0)
VLDL: 25.2 mg/dL (ref 0.0–40.0)

## 2019-09-23 LAB — HEMOGLOBIN A1C: Hgb A1c MFr Bld: 5.9 % (ref 4.6–6.5)

## 2019-09-23 LAB — TSH: TSH: 1.33 u[IU]/mL (ref 0.35–4.50)

## 2019-09-23 MED ORDER — PREDNISONE 10 MG PO TABS: ORAL_TABLET | ORAL | 0 refills | Status: DC

## 2019-09-23 NOTE — Patient Instructions (Addendum)
I have sent a prednisone prescription for future use.    Cell phone for emergencies: [phone]   Health Maintenance, Female Adopting a healthy lifestyle and getting preventive care are important in promoting health and wellness. Ask your health care provider about:  The right schedule for you to have regular tests and exams.  Things you can do on your own to prevent diseases and keep yourself healthy. What should I know about diet, weight, and exercise? Eat a healthy diet   Eat a diet that includes plenty of vegetables, fruits, low-fat dairy products, and lean protein.  Do not eat a lot of foods that are high in solid fats, added sugars, or sodium. Maintain a healthy weight Body mass index (BMI) is used to identify weight problems. It estimates body fat based on height and weight. Your health care provider can help determine your BMI and help you achieve or maintain a healthy weight. Get regular exercise Get regular exercise. This is one of the most important things you can do for your health. Most adults should:  Exercise for at least 150 minutes each week. The exercise should increase your heart rate and make you sweat (moderate-intensity exercise).  Do strengthening exercises at least twice a week. This is in addition to the moderate-intensity exercise.  Spend less time sitting. Even light physical activity can be beneficial. Watch cholesterol and blood lipids Have your blood tested for lipids and cholesterol at 45 years of age, then have this test every 5 years. Have your cholesterol levels checked more often if:  Your lipid or cholesterol levels are high.  You are older than 45 years of age.  You are at high risk for heart disease. What should I know about cancer screening? Depending on your health history and family history, you may need to have cancer screening at various ages. This may include screening for:  Breast cancer.  Cervical cancer.  Colorectal  cancer.  Skin cancer.  Lung cancer. What should I know about heart disease, diabetes, and high blood pressure? Blood pressure and heart disease  High blood pressure causes heart disease and increases the risk of stroke. This is more likely to develop in people who have high blood pressure readings, are of African descent, or are overweight.  Have your blood pressure checked: ? Every 3-5 years if you are 45-100 years of age. ? Every year if you are 45 years old or older. Diabetes Have regular diabetes screenings. This checks your fasting blood sugar level. Have the screening done:  Once every three years after age 45 if you are at a normal weight and have a low risk for diabetes.  More often and at a younger age if you are overweight or have a high risk for diabetes. What should I know about preventing infection? Hepatitis B If you have a higher risk for hepatitis B, you should be screened for this virus. Talk with your health care provider to find out if you are at risk for hepatitis B infection. Hepatitis C Testing is recommended for:  Everyone born from [birth year] through 1967.  Anyone with known risk factors for hepatitis C. Sexually transmitted infections (STIs)  Get screened for STIs, including gonorrhea and chlamydia, if: ? You are sexually active and are younger than 45 years of age. ? You are older than 45 years of age and your health care provider tells you that you are at risk for this type of infection. ? Your sexual activity has  changed since you were last screened, and you are at increased risk for chlamydia or gonorrhea. Ask your health care provider if you are at risk.  Ask your health care provider about whether you are at high risk for HIV. Your health care provider may recommend a prescription medicine to help prevent HIV infection. If you choose to take medicine to prevent HIV, you should first get tested for HIV. You should then be tested every 3 months for as long as  you are taking the medicine. Pregnancy  If you are about to stop having your period (premenopausal) and you may become pregnant, seek counseling before you get pregnant.  Take 400 to 800 micrograms (mcg) of folic acid every day if you become pregnant.  Ask for birth control (contraception) if you want to prevent pregnancy. Osteoporosis and menopause Osteoporosis is a disease in which the bones lose minerals and strength with aging. This can result in bone fractures. If you are 45 years old or older, or if you are at risk for osteoporosis and fractures, ask your health care provider if you should:  Be screened for bone loss.  Take a calcium or vitamin D supplement to lower your risk of fractures.  Be given hormone replacement therapy (HRT) to treat symptoms of menopause. Follow these instructions at home: Lifestyle  Do not use any products that contain nicotine or tobacco, such as cigarettes, e-cigarettes, and chewing tobacco. If you need help quitting, ask your health care provider.  Do not use street drugs.  Do not share needles.  Ask your health care provider for help if you need support or information about quitting drugs. Alcohol use  Do not drink alcohol if: ? Your health care provider tells you not to drink. ? You are pregnant, may be pregnant, or are planning to become pregnant.  If you drink alcohol: ? Limit how much you use to 0-1 drink a day. ? Limit intake if you are breastfeeding.  Be aware of how much alcohol is in your drink. In the [place] one drink equals one 12 oz bottle of beer (355 mL), one 5 oz glass of wine (148 mL), or one 1 oz glass of hard liquor (44 mL). General instructions  Schedule regular health, dental, and eye exams.  Stay current with your vaccines.  Tell your health care provider if: ? You often feel depressed. ? You have ever been abused or do not feel safe at home. Summary  Adopting a healthy lifestyle and getting preventive care are  important in promoting health and wellness.  Follow your health care provider's instructions about healthy diet, exercising, and getting tested or screened for diseases.  Follow your health care provider's instructions on monitoring your cholesterol and blood pressure. This information is not intended to replace advice given to you by your health care provider. Make sure you discuss any questions you have with your health care provider. Document Released: 05/13/2011 Document Revised: 10/21/2018 Document Reviewed: 10/21/2018 Elsevier Patient Education  2020 [employer].

## 2019-09-23 NOTE — Progress Notes (Signed)
Patient ID: Autumn Martinez, female    DOB: 24-May-1976  Age: 43 y.o. MRN: [id number]  The patient is here for annual  preventive exam and management of other chronic and acute problems.  Mammogram ultimately normal Aug 12 2019  PAP NORMAL  2019   The risk factors are reflected in the social history.  The roster of all physicians providing medical care to patient - is listed in the Snapshot section of the chart.  Activities of daily living:  The patient is 100% independent in all ADLs: dressing, toileting, feeding as well as independent mobility  Home safety : The patient has smoke detectors in the home. They wear seatbelts.  There are no firearms at home. There is no violence in the home.   There is no risks for hepatitis, STDs or HIV. There is no   history of blood transfusion. They have no travel history to infectious disease endemic areas of the world.  The patient has seen their dentist in the last six month. They have seen their eye doctor in the last year. They admit to slight hearing difficulty with regard to whispered voices and some television programs.  They have deferred audiologic testing in the last year.  They do not  have excessive sun exposure. Discussed the need for sun protection: hats, long sleeves and use of sunscreen if there is significant sun exposure.   Diet: the importance of a healthy diet is discussed. They do have a healthy diet.  The benefits of regular aerobic exercise were discussed. She walks 4 times per week ,  20 minutes.   Depression screen: there are no signs or vegative symptoms of depression- irritability, change in appetite, anhedonia, sadness/tearfullness.  Cognitive assessment: the patient manages all their financial and personal affairs and is actively engaged. They could relate day,date,year and events; recalled 2/3 objects at 3 minutes; performed clock-face test normally.  The following portions of the patient's history were reviewed and  updated as appropriate: allergies, current medications, past family history, past medical history,  past surgical history, past social history  and problem list.  Visual acuity was not assessed per patient preference since she has regular follow up with her ophthalmologist. Hearing and body mass index were assessed and reviewed.   During the course of the visit the patient was educated and counseled about appropriate screening and preventive services including : fall prevention , diabetes screening, nutrition counseling, colorectal cancer screening, and recommended immunizations.    CC: The primary encounter diagnosis was Essential hypertension. Diagnoses of Other fatigue, Family history of diabetes mellitus, Encounter for preventive health examination, Pyriformis syndrome, left, Prediabetes, and Anemia, unspecified type were also pertinent to this visit.  History of Recurrent sciatica:  MRI L spine done in May and reviewed today :  She has Broad based bone  SPURRING AT l5-s1 with F/S and possible impingement of nerve roots bilaterally L > R.  Sent to [facility] for l sciatica  And advised against surgery.  Her pain has been   imroving with gabapentin and tylenol but she has given up running   Working for [employer].  Emotionally stressed by the threat of COVID 19  History Autumn Martinez has a past medical history of Allergy, Depression, Heart murmur, and Hypertension.   She has a past surgical history that includes Cesarean section (2002); Cesarean section (2007); Cholecystectomy (2005); Tonsillectomy and adenoidectomy (Bilateral, 1980); and Breast biopsy (Right, 2018).   Her family history includes Breast cancer (age of onset: 64) in her  maternal aunt; Cancer in her father and maternal aunt; Diabetes (age of onset: 45) in her mother; Hypertension in her father and mother; Mental retardation in her maternal uncle.She reports that she has never smoked. She has never used smokeless tobacco. She reports that she  does not drink alcohol or use drugs.  Outpatient Medications Prior to Visit  Medication Sig Dispense Refill  . amLODipine (NORVASC) 2.5 MG tablet TAKE 1 TABLET BY MOUTH ONCE DAILY 90 tablet 1  . clindamycin-benzoyl peroxide (BENZACLIN) gel Apply topically two times daily as needed 50 g 5  . EPINEPHrine 0.3 mg/0.3 mL IJ SOAJ injection Inject 0.3 mLs (0.3 mg total) into the muscle once. (Patient taking differently: Inject 0.3 mg into the muscle once as needed. ) 1 Device 1  . furosemide (LASIX) 20 MG tablet Take 1 tablet (20 mg total) by mouth daily. As needed for edema 90 tablet 0  . gabapentin (NEURONTIN) 300 MG capsule TAKE 1 CAPSULE (300 MG TOTAL) BY MOUTH 2 (TWO) TIMES DAILY. 180 capsule 1  . HYDROcodone-acetaminophen (NORCO) 10-325 MG tablet Take 1 tablet by mouth every 6 (six) hours as needed. 30 tablet 0  . losartan (COZAAR) 100 MG tablet TAKE 1 TABLET (100 MG TOTAL) BY MOUTH DAILY. 90 tablet 0  . Multiple Vitamins-Minerals (MULTIVITAMIN WITH MINERALS) tablet Take 1 tablet by mouth daily.    Marland Kitchen omeprazole (PRILOSEC) 40 MG capsule TAKE 1 CAPSULE BY MOUTH ONCE DAILY 90 capsule 1  . predniSONE (DELTASONE) 10 MG tablet 6 tablets daily for 2 days,  reduce by 1 tablet every 2 days  until gone 42 tablet 0  . oxyCODONE-acetaminophen (PERCOCET) 5-325 MG tablet Take 1 tablet by mouth every 4 (four) hours as needed for severe pain. 20 tablet 0   No facility-administered medications prior to visit.     Review of Systems   Patient denies headache, fevers, malaise, unintentional weight loss, skin rash, eye pain, sinus congestion and sinus pain, sore throat, dysphagia,  hemoptysis , cough, dyspnea, wheezing, chest pain, palpitations, orthopnea, edema, abdominal pain, nausea, melena, diarrhea, constipation, flank pain, dysuria, hematuria, urinary  Frequency, nocturia, numbness, tingling, seizures,  Focal weakness, Loss of consciousness,  Tremor, insomnia, depression, , and suicidal ideation.       Objective:  BP 130/80   Pulse 79   Temp 98.4 F (36.9 C) (Temporal)   Ht 5\' 4"  (1.626 m)   Wt 173 lb 3.2 oz (78.6 kg)   LMP 09/02/2019 (Approximate)   SpO2 98%   BMI 29.73 kg/m   Physical Exam   General appearance: alert, cooperative and appears stated age Head: Normocephalic, without obvious abnormality, atraumatic Eyes: conjunctivae/corneas clear. PERRL, EOM's intact. Fundi benign. Ears: normal TM's and external ear canals both ears Nose: Nares normal. Septum midline. Mucosa normal. No drainage or sinus tenderness. Throat: lips, mucosa, and tongue normal; teeth and gums normal Neck: no adenopathy, no carotid bruit, no JVD, supple, symmetrical, trachea midline and thyroid not enlarged, symmetric, no tenderness/mass/nodules Lungs: clear to auscultation bilaterally Breasts: normal appearance, no masses or tenderness Heart: regular rate and rhythm, S1, S2 normal, no murmur, click, rub or gallop Abdomen: soft, non-tender; bowel sounds normal; no masses,  no organomegaly Extremities: extremities normal, atraumatic, no cyanosis or edema Pulses: 2+ and symmetric Skin: Skin color, texture, turgor normal. No rashes or lesions Neurologic: Alert and oriented X 3, normal strength and tone. Normal symmetric reflexes. Normal coordination and gait.      Assessment & Plan:   Problem List Items Addressed  This Visit      Unprioritized   Encounter for preventive health examination    age appropriate education and counseling updated, referrals for preventative services and immunizations addressed, dietary and smoking counseling addressed, most recent labs reviewed.  I have personally reviewed and have noted:  1) the patient's medical and social history 2) The pt's use of alcohol, tobacco, and illicit drugs 3) The patient's current medications and supplements 4) Functional ability including ADL's, fall risk, home safety risk, hearing and visual impairment 5) Diet and physical  activities 6) Evidence for depression or mood disorder 7) The patient's height, weight, and BMI have been recorded in the chart  I have made referrals, and provided counseling and education based on review of the above      Pyriformis syndrome, left    She has temporarily given up running to avoid more exacerbations.  Prednisone taper written for future use.       Prediabetes    Her  Fasting glucose is not  elevated but her A1c suggests she is at risk for developing diabetes.  I recommend he follow a low glycemic index diet and particpate regularly in an aerobic  exercise activity.  We should check an A1c in 6 months.        Anemia    She has a history of menometrorrhagia which may be the cause.  Iron studies needed        Relevant Orders   Iron, TIBC and Ferritin Panel   B12 and Folate Panel   Essential hypertension - Primary   Relevant Orders   Comprehensive metabolic panel (Completed)   Fatigue   Relevant Orders   TSH (Completed)   CBC with Differential/Platelet (Completed)    Other Visit Diagnoses    Family history of diabetes mellitus       Relevant Orders   Hemoglobin A1c (Completed)   Lipid panel (Completed)      I am having Autumn Martinez start on predniSONE. I am also having her maintain her multivitamin with minerals, EPINEPHrine, predniSONE, oxyCODONE-acetaminophen, omeprazole, HYDROcodone-acetaminophen, furosemide, clindamycin-benzoyl peroxide, gabapentin, amLODipine, and losartan.  Meds ordered this encounter  Medications  . predniSONE (DELTASONE) 10 MG tablet    Sig: 6 tablets DAILY FOR 3 DAYS,  then reduce by 1 tablet daily until gone    Dispense:  33 tablet    Refill:  0    There are no discontinued medications.  Follow-up: No follow-ups on file.   Crecencio Mc, MD

## 2019-09-25 DIAGNOSIS — D500 Iron deficiency anemia secondary to blood loss (chronic): Secondary | ICD-10-CM | POA: Insufficient documentation

## 2019-09-25 DIAGNOSIS — R7303 Prediabetes: Secondary | ICD-10-CM | POA: Insufficient documentation

## 2019-09-25 NOTE — Assessment & Plan Note (Signed)
She has temporarily given up running to avoid more exacerbations.  Prednisone taper written for future use.

## 2019-09-25 NOTE — Assessment & Plan Note (Signed)
Her  Fasting glucose is not  elevated but her A1c suggests she is at risk for developing diabetes.  I recommend he follow a low glycemic index diet and particpate regularly in an aerobic  exercise activity.  We should check an A1c in 6 months.

## 2019-09-25 NOTE — Assessment & Plan Note (Signed)

## 2019-09-25 NOTE — Assessment & Plan Note (Signed)
She has a history of menometrorrhagia which may be the cause.  Iron studies needed

## 2019-09-30 ENCOUNTER — Other Ambulatory Visit: Payer: 59

## 2019-10-03 ENCOUNTER — Other Ambulatory Visit: Payer: Self-pay

## 2019-10-03 DIAGNOSIS — D649 Anemia, unspecified: Secondary | ICD-10-CM

## 2019-10-04 ENCOUNTER — Other Ambulatory Visit: Payer: Self-pay | Admitting: Internal Medicine

## 2019-10-04 DIAGNOSIS — K219 Gastro-esophageal reflux disease without esophagitis: Secondary | ICD-10-CM

## 2019-10-05 NOTE — Addendum Note (Signed)
Addended by: Adair Laundry on: 10/05/2019 04:12 PM   Modules accepted: Orders

## 2019-10-06 DIAGNOSIS — D649 Anemia, unspecified: Secondary | ICD-10-CM | POA: Diagnosis not present

## 2019-10-06 NOTE — Telephone Encounter (Signed)
Pt called stating her lab request she did not receive it. Pt would like if possible to re fax to [phone]. Thank you!

## 2019-10-07 LAB — B12 AND FOLATE PANEL
Folate: 19.1 ng/mL (ref >3.0)
Vitamin B-12: 1766 pg/mL — ABNORMAL HIGH (ref 232–1245)

## 2019-10-07 LAB — IRON,TIBC AND FERRITIN PANEL
Ferritin: 8 ng/mL — ABNORMAL LOW (ref 15–150)
Iron Saturation: 6 % — CL (ref 15–55)
Iron: 24 ug/dL — ABNORMAL LOW (ref 27–159)
Total Iron Binding Capacity: 376 ug/dL (ref 250–450)
UIBC: 352 ug/dL (ref 131–425)

## 2019-10-11 DIAGNOSIS — D509 Iron deficiency anemia, unspecified: Secondary | ICD-10-CM

## 2019-10-11 DIAGNOSIS — D500 Iron deficiency anemia secondary to blood loss (chronic): Secondary | ICD-10-CM

## 2019-10-18 ENCOUNTER — Other Ambulatory Visit: Payer: Self-pay | Admitting: Internal Medicine

## 2019-10-28 ENCOUNTER — Other Ambulatory Visit (INDEPENDENT_AMBULATORY_CARE_PROVIDER_SITE_OTHER): Payer: 59

## 2019-10-28 ENCOUNTER — Other Ambulatory Visit: Payer: Self-pay

## 2019-10-28 DIAGNOSIS — D509 Iron deficiency anemia, unspecified: Secondary | ICD-10-CM

## 2019-11-01 LAB — FECAL OCCULT BLOOD, IMMUNOCHEMICAL: Fecal Occult Bld: NEGATIVE

## 2019-12-16 DIAGNOSIS — M5430 Sciatica, unspecified side: Secondary | ICD-10-CM

## 2019-12-16 DIAGNOSIS — G5702 Lesion of sciatic nerve, left lower limb: Secondary | ICD-10-CM

## 2019-12-16 DIAGNOSIS — D500 Iron deficiency anemia secondary to blood loss (chronic): Secondary | ICD-10-CM

## 2019-12-19 ENCOUNTER — Other Ambulatory Visit: Payer: Self-pay | Admitting: Internal Medicine

## 2019-12-19 MED ORDER — PREDNISONE 10 MG PO TABS: ORAL_TABLET | ORAL | 0 refills | Status: DC

## 2019-12-22 ENCOUNTER — Other Ambulatory Visit: Payer: Self-pay | Admitting: Internal Medicine

## 2019-12-22 ENCOUNTER — Other Ambulatory Visit: Payer: Self-pay

## 2019-12-23 MED ORDER — LOSARTAN POTASSIUM 100 MG PO TABS: 100.0000 mg | ORAL_TABLET | Freq: Every day | ORAL | 1 refills | Status: DC

## 2019-12-23 MED ORDER — FUROSEMIDE 20 MG PO TABS: 20.0000 mg | ORAL_TABLET | ORAL | 0 refills | Status: DC | PRN

## 2019-12-26 NOTE — Addendum Note (Signed)
Addended by: Crecencio Mc on: 12/26/2019 09:13 PM   Modules accepted: Orders

## 2020-01-07 ENCOUNTER — Other Ambulatory Visit: Payer: Self-pay | Admitting: Internal Medicine

## 2020-01-13 ENCOUNTER — Ambulatory Visit: Payer: 59

## 2020-01-20 ENCOUNTER — Ambulatory Visit: Payer: 59

## 2020-02-10 ENCOUNTER — Ambulatory Visit: Payer: 59 | Admitting: Gastroenterology

## 2020-02-10 ENCOUNTER — Other Ambulatory Visit (INDEPENDENT_AMBULATORY_CARE_PROVIDER_SITE_OTHER): Payer: 59

## 2020-02-10 ENCOUNTER — Encounter: Payer: Self-pay | Admitting: Gastroenterology

## 2020-02-10 ENCOUNTER — Ambulatory Visit: Payer: 59

## 2020-02-10 VITALS — BP 124/76 | HR 83 | Temp 98.7°F | Ht 64.0 in | Wt 179.0 lb

## 2020-02-10 DIAGNOSIS — D509 Iron deficiency anemia, unspecified: Secondary | ICD-10-CM

## 2020-02-10 LAB — FERRITIN: Ferritin: 22.9 ng/mL (ref 10.0–291.0)

## 2020-02-10 LAB — IRON: Iron: 149 ug/dL — ABNORMAL HIGH (ref 42–145)

## 2020-02-10 MED ORDER — NA SULFATE-K SULFATE-MG SULF 17.5-3.13-1.6 GM/177ML PO SOLN: 1.0000 | ORAL | 0 refills | Status: AC

## 2020-02-10 NOTE — Progress Notes (Addendum)
Referring Provider: Crecencio Mc, MD Primary Care Physician:  Crecencio Mc, MD  Reason for Consultation:  Iron deficiency   IMPRESSION:  Symptomatic iron deficiency anemia without overt GI bleeding    - FOBT negative Many years of menometrorrhagia previously evaluated by GYN GERD controlled on daily PPI Patient reported diagnosis of IBS with alternating diarrhea and constipation No prior colon cancer screening No known family history of colon cancer or polyps  Recommended an EGD with duodenal biopsies and colonoscopy to evaluate for GI causes of iron deficiency including both celiac and GI blood loss anemia.  Today to monitor her response to oral iron supplements that were started in November.  A low threshold to recommend IV iron given her ongoing symptomatology.   PLAN: Continue oral iron Labs today: Iron, ferritin, and percent saturation Start IV iron if labs reflect low iron levels despite oral iron EGD with duodenal biopsies and colonoscopy   HPI: Autumn Martinez is a 44 y.o. female for by Dr. Derrel Nip for evaluation of iron deficiency anemia.  History is obtained to the patient and review of her electronic health record.  She has anxiety, hypertension, and had a cholecystectomy in 2005.  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  Trauma survivor. Has had a 10 year history of alternating constipation and diarrhea since that time. Some identified food triggers.   Hemorrhoids with purritus since children were born with C-sections. Children are now 12 and 19.  GERD symptoms started 3 years ago treated with Prilosec. Some globus that time. Started on PPI with resolution of symptoms.Scherrie November. Hurt her back last march. Started feeling bad in August.  Routine labs showed iron deficiency.  She has multiple symptoms that she attributes to the anemia including cold intolerance, dyspnea on exertion, and occasional headaches, and  increased irritability.  No chest pain.  No pica.  No beeturia.  No hearing loss.    No overt GI blood loss. No melena, hematochezia, bright red blood per rectum. No epistaxis, hemoptysis, or hematuria.  Has had many years of menometrorrhagia. Previously evaluated by GYN.   No identified exacerbating or relieving features.  Started Slow Fe oral iron supplements in the fall. No associated symptoms.  Has had no follow-up labs since starting iron supplements.  Labs: Labs 06/05/2017: Hemoglobin 13.1, MCV 91.5, RDW 13.1, platelets 224 09/23/2019: Normal liver enzymes Labs 10/06/2019: Iron 24, ferritin 8, iron saturation 6, B12 and folate were normal, hemoglobin 10.5, MCV 78.7, RDW 16.7, platelets 345 Labs 10/28/2019: Fecal occult blood negative  No prior endoscopic evaluation.  No known family history of colon cancer or polyps. No family history of uterine/endometrial cancer, pancreatic cancer or gastric/stomach cancer. No autoimmune disease.  Gliobastoma multiforme in her father at age 54.    Past Medical History:  Diagnosis Date  . Allergy   . Depression   . Heart murmur   . Hypertension     Past Surgical History:  Procedure Laterality Date  . BREAST BIOPSY Right 2018   benign  . CESAREAN SECTION  2002  . CESAREAN SECTION  2007  . CHOLECYSTECTOMY  2005  . TONSILLECTOMY AND ADENOIDECTOMY Bilateral 1980    Current Outpatient Medications  Medication Sig Dispense Refill  . clindamycin-benzoyl peroxide (BENZACLIN) gel Apply topically two times daily as needed 50 g 5  . EPINEPHrine 0.3 mg/0.3 mL IJ SOAJ injection Inject 0.3 mLs (0.3 mg total) into the muscle once. (Patient taking differently: Inject  0.3 mg into the muscle once as needed. ) 1 Device 1  . ferrous sulfate (EQL SLOW RELEASE IRON) 160 (50 Fe) MG TBCR SR tablet Take 160 mg by mouth daily.    . furosemide (LASIX) 20 MG tablet TAKE 1 TABLET BY MOUTH DAILY AS NEEDED FOR EDEMA 90 tablet 0  . gabapentin (NEURONTIN) 300 MG  capsule TAKE 1 CAPSULE (300 MG TOTAL) BY MOUTH 2 (TWO) TIMES DAILY. (Patient taking differently: Take 300 mg by mouth daily. ) 180 capsule 1  . losartan (COZAAR) 100 MG tablet Take 1 tablet (100 mg total) by mouth daily. 90 tablet 1  . Multiple Vitamins-Minerals (MULTIVITAMIN WITH MINERALS) tablet Take 1 tablet by mouth daily.    Marland Kitchen omeprazole (PRILOSEC) 40 MG capsule TAKE 1 CAPSULE BY MOUTH ONCE DAILY 90 capsule 1  . predniSONE (DELTASONE) 10 MG tablet 6 tablets daily for 2 days,  reduce by 1 tablet every 2 days  until gone (Patient taking differently: as needed. 6 tablets daily for 2 days,  reduce by 1 tablet every 2 days  until gone) 42 tablet 0  . amLODipine (NORVASC) 2.5 MG tablet TAKE 1 TABLET BY MOUTH ONCE DAILY 90 tablet 1   No current facility-administered medications for this visit.    Allergies as of 02/10/2020 - Review Complete 02/10/2020  Allergen Reaction Noted  . Butorphanol tartrate  02/16/2009  . Sulfonamide derivatives  02/16/2009  . Hctz [hydrochlorothiazide] Rash 01/28/2016  . Other Rash 01/28/2016    Family History  Problem Relation Age of Onset  . Hypertension Mother   . Diabetes Mother 27  . Hypertension Father   . Brain cancer Father   . Cancer Maternal Aunt   . Breast cancer Maternal Aunt 48  . Mental retardation Maternal Uncle   . Colon cancer Neg Hx   . Esophageal cancer Neg Hx   . Stomach cancer Neg Hx     Social History   Socioeconomic History  . Marital status: Married    Spouse name: Not on file  . Number of children: Not on file  . Years of education: Not on file  . Highest education level: Not on file  Occupational History  . Not on file  Tobacco Use  . Smoking status: Never Smoker  . Smokeless tobacco: Never Used  Substance and Sexual Activity  . Alcohol use: No  . Drug use: No  . Sexual activity: Yes  Other Topics Concern  . Not on file  Social History Narrative  . Not on file   Social Determinants of Health   Financial  Resource Strain:   . Difficulty of Paying Living Expenses:   Food Insecurity:   . Worried About Charity fundraiser in the Last Year:   . Arboriculturist in the Last Year:   Transportation Needs:   . Film/video editor (Medical):   Marland Kitchen Lack of Transportation (Non-Medical):   Physical Activity:   . Days of Exercise per Week:   . Minutes of Exercise per Session:   Stress:   . Feeling of Stress :   Social Connections:   . Frequency of Communication with Friends and Family:   . Frequency of Social Gatherings with Friends and Family:   . Attends Religious Services:   . Active Member of Clubs or Organizations:   . Attends Archivist Meetings:   Marland Kitchen Marital Status:   Intimate Partner Violence:   . Fear of Current or Ex-Partner:   . Emotionally Abused:   .  Physically Abused:   . Sexually Abused:     Review of Systems: 12 system ROS is negative except as noted above with the addition of fatigue, occasional shortness of breath, and insomnia.   Physical Exam: General:   Alert,  well-nourished, pleasant and cooperative in NAD. No pallor. Head:  Normocephalic and atraumatic. No alopecia.  Eyes:  Sclera clear, no icterus.   Conjunctiva pink. Ears:  Normal auditory acuity. Nose:  No deformity, discharge,  or lesions. Mouth:  No deformity or lesions.  No atrophic glossitis. No cheilosis. Neck:  Supple; no masses or thyromegaly. Lungs:  Clear throughout to auscultation.   No wheezes. Heart:  Regular rate and rhythm; no murmurs. Abdomen:  Soft, nontender, nondistended, normal bowel sounds, no rebound or guarding. No hepatosplenomegaly.   Rectal:  Deferred  Msk:  Symmetrical. No boney deformities LAD: No inguinal or umbilical LAD Extremities:  No clubbing or edema. Neurologic:  Alert and  oriented x4;  grossly nonfocal Skin:  No rash or bruise. No koilonychia.  Psych:  Alert and cooperative. Normal mood and affect.     Aster Screws L. Tarri Glenn, MD, MPH 02/10/2020, 2:50  PM

## 2020-02-10 NOTE — Patient Instructions (Signed)
You have been scheduled for an endoscopy and colonoscopy. Please follow the written instructions given to you at your visit today. Please pick up your prep supplies at the pharmacy within the next 1-3 days. If you use inhalers (even only as needed), please bring them with you on the day of your procedure.  I value your feedback and thank you for entrusting us with your care. If you get a [facility] patient survey, I would appreciate you taking the time to let us know about your experience today. Thank you!   Due to recent changes in healthcare laws, you may see the results of your imaging and laboratory studies on MyChart before your provider has had a chance to review them.  We understand that in some cases there may be results that are confusing or concerning to you. Not all laboratory results come back in the same time frame and the provider may be waiting for multiple results in order to interpret others.  Please give us 48 hours in order for your provider to thoroughly review all the results before contacting the office for clarification of your results.     

## 2020-02-16 LAB — TRANSFERRIN SATURATION
IRON SATN MFR SERPL: 47 % Saturation
IRON SERPL-MCNC: 146 ug/dL — ABNORMAL HIGH
TRANSFERRIN SERPL-MCNC: 220 mg/dL

## 2020-02-17 ENCOUNTER — Ambulatory Visit: Payer: 59

## 2020-02-22 ENCOUNTER — Ambulatory Visit
Admission: EM | Admit: 2020-02-22 | Discharge: 2020-02-22 | Disposition: A | Discharge status: Home / Self Care | Payer: 59 | Attending: Emergency Medicine | Admitting: Emergency Medicine

## 2020-02-22 ENCOUNTER — Inpatient Hospital Stay: Admission: RE | Admit: 2020-02-22 | Payer: 59 | Source: Ambulatory Visit

## 2020-02-22 ENCOUNTER — Telehealth: Payer: 59

## 2020-02-22 ENCOUNTER — Other Ambulatory Visit: Payer: Self-pay

## 2020-02-22 ENCOUNTER — Encounter (HOSPITAL_COMMUNITY): Payer: Self-pay

## 2020-02-22 DIAGNOSIS — N390 Urinary tract infection, site not specified: Secondary | ICD-10-CM | POA: Insufficient documentation

## 2020-02-22 LAB — POCT URINALYSIS DIP (MANUAL ENTRY)
Glucose, UA: 250 mg/dL — AB
Nitrite, UA: POSITIVE — AB
Protein Ur, POC: >=300 mg/dL — AB
Spec Grav, UA: 1.02 (ref 1.010–1.025)
Urobilinogen, UA: >=8 E.U./dL — AB
pH, UA: 5 (ref 5.0–8.0)

## 2020-02-22 MED ORDER — NITROFURANTOIN MONOHYD MACRO 100 MG PO CAPS: 100.0000 mg | ORAL_CAPSULE | Freq: Two times a day (BID) | ORAL | 0 refills | Status: DC

## 2020-02-22 NOTE — Discharge Instructions (Addendum)
POCT urinalysis dipstick  04/13 1203 Color, UA red  Clarity, UA cloudy  Glucose =250  Bilirubin, UA moderate  Bilirubin, UA small (15)  Specific Gravity, UA 1.020  RBC, UA trace-intact  pH, UA 5.0  Protein,UA >=300  Urobilinogen, UA >=8.0  Nitrite, UA Positive  Leukocytes,UA Large (3+)   Take the Macrobid as directed.  Follow-up with your primary care provider if your symptoms are not improving.

## 2020-02-22 NOTE — ED Provider Notes (Signed)
[facility]    CSN: [id number] Arrival date & time: 02/22/20  1145      History   Chief Complaint Chief Complaint  Patient presents with  . Urinary Tract Infection    HPI Autumn Martinez is a 44 y.o. female.   Patient presents with dysuria and urinary frequency since early this morning.  She also reports some nausea and suprapubic pressure.  She denies fever, chills, rash, lesions, vaginal discharge, back pain, flank pain, pelvic pain, or other symptoms.  She has been treating her symptoms at home with OTC Azo.  The history is provided by the patient.    Past Medical History:  Diagnosis Date  . Allergy   . Depression   . Heart murmur   . Hypertension     Patient Active Problem List   Diagnosis Date Noted  . Prediabetes 09/25/2019  . Iron deficiency anemia due to chronic blood loss 09/25/2019  . Pyriformis syndrome, left 02/04/2019  . Fear of flying 05/04/2018  . GERD (gastroesophageal reflux disease) 02/17/2018  . PMDD (premenstrual dysphoric disorder) 06/20/2016  . PVC (premature ventricular contraction) 07/20/2015  . Encounter for preventive health examination 10/25/2014  . Pap smear for cervical cancer screening 10/25/2014  . Food allergy 10/25/2014  . Heart murmur, systolic 95/28/4132  . Fatigue 05/18/2013  . Menometrorrhagia 05/18/2013  . PTSD (post-traumatic stress disorder) 05/18/2013  . Essential hypertension 02/16/2009    Past Surgical History:  Procedure Laterality Date  . BREAST BIOPSY Right 2018   benign  . CESAREAN SECTION  2002  . CESAREAN SECTION  2007  . CHOLECYSTECTOMY  2005  . TONSILLECTOMY AND ADENOIDECTOMY Bilateral 1980    OB History   No obstetric history on file.      Home Medications    Prior to Admission medications   Medication Sig Start Date End Date Taking? Authorizing Provider  amLODipine (NORVASC) 2.5 MG tablet TAKE 1 TABLET BY MOUTH ONCE DAILY 09/02/19  Yes Crecencio Mc, MD  clindamycin-benzoyl  peroxide (BENZACLIN) gel Apply topically two times daily as needed 07/12/19  Yes Crecencio Mc, MD  ferrous sulfate (EQL SLOW RELEASE IRON) 160 (50 Fe) MG TBCR SR tablet Take 160 mg by mouth daily.   Yes [provider]  furosemide (LASIX) 20 MG tablet TAKE 1 TABLET BY MOUTH DAILY AS NEEDED FOR EDEMA 01/07/20  Yes Crecencio Mc, MD  gabapentin (NEURONTIN) 300 MG capsule TAKE 1 CAPSULE (300 MG TOTAL) BY MOUTH 2 (TWO) TIMES DAILY. Patient taking differently: Take 300 mg by mouth daily.  07/22/19  Yes Crecencio Mc, MD  losartan (COZAAR) 100 MG tablet Take 1 tablet (100 mg total) by mouth daily. 12/23/19  Yes Crecencio Mc, MD  Multiple Vitamins-Minerals (MULTIVITAMIN WITH MINERALS) tablet Take 1 tablet by mouth daily.   Yes [provider]  Na Sulfate-K Sulfate-Mg Sulf 17.5-3.13-1.6 GM/177ML SOLN Take 1 kit by mouth as directed. 02/10/20 03/11/20 Yes Thornton Park, MD  omeprazole (PRILOSEC) 40 MG capsule TAKE 1 CAPSULE BY MOUTH ONCE DAILY 10/04/19  Yes Crecencio Mc, MD  predniSONE (DELTASONE) 10 MG tablet 6 tablets daily for 2 days,  reduce by 1 tablet every 2 days  until gone Patient taking differently: as needed. 6 tablets daily for 2 days,  reduce by 1 tablet every 2 days  until gone 12/19/19  Yes Crecencio Mc, MD  EPINEPHrine 0.3 mg/0.3 mL IJ SOAJ injection Inject 0.3 mLs (0.3 mg total) into the muscle once. Patient taking  differently: Inject 0.3 mg into the muscle once as needed.  10/25/14   Crecencio Mc, MD  nitrofurantoin, macrocrystal-monohydrate, (MACROBID) 100 MG capsule Take 1 capsule (100 mg total) by mouth 2 (two) times daily. 02/22/20   Sharion Balloon, NP    Family History Family History  Problem Relation Age of Onset  . Hypertension Mother   . Diabetes Mother 33  . Hypertension Father   . Brain cancer Father   . Cancer Maternal Aunt   . Breast cancer Maternal Aunt 48  . Mental retardation Maternal Uncle   . Colon cancer Neg Hx   . Esophageal cancer  Neg Hx   . Stomach cancer Neg Hx     Social History Social History   Tobacco Use  . Smoking status: Never Smoker  . Smokeless tobacco: Never Used  Substance Use Topics  . Alcohol use: No  . Drug use: No     Allergies   Butorphanol tartrate, Sulfonamide derivatives, Hctz [hydrochlorothiazide], and Other   Review of Systems Review of Systems  Constitutional: Negative for chills and fever.  HENT: Negative for ear pain and sore throat.   Eyes: Negative for pain and visual disturbance.  Respiratory: Negative for cough and shortness of breath.   Cardiovascular: Negative for chest pain and palpitations.  Gastrointestinal: Positive for abdominal pain and nausea. Negative for diarrhea and vomiting.  Genitourinary: Positive for dysuria and frequency. Negative for hematuria and vaginal discharge.  Musculoskeletal: Negative for arthralgias and back pain.  Skin: Negative for color change and rash.  Neurological: Negative for seizures and syncope.  All other systems reviewed and are negative.    Physical Exam Triage Vital Signs ED Triage Vitals  Enc Vitals Group     BP      Pulse      Resp      Temp      Temp src      SpO2      Weight      Height      Head Circumference      Peak Flow      Pain Score      Pain Loc      Pain Edu?      Excl. in [place]?    No data found.  Updated Vital Signs BP (!) 141/89 (BP Location: Left Arm)   Pulse 74   Temp 98.1 F (36.7 C) (Oral)   Resp 18   LMP 02/08/2020   SpO2 96%   Visual Acuity Right Eye Distance:   Left Eye Distance:   Bilateral Distance:    Right Eye Near:   Left Eye Near:    Bilateral Near:     Physical Exam Vitals and nursing note reviewed.  Constitutional:      General: She is not in acute distress.    Appearance: She is well-developed.  HENT:     Head: Normocephalic and atraumatic.     Mouth/Throat:     Mouth: Mucous membranes are moist.  Eyes:     Conjunctiva/sclera: Conjunctivae normal.    Cardiovascular:     Rate and Rhythm: Normal rate and regular rhythm.     Heart sounds: No murmur.  Pulmonary:     Effort: Pulmonary effort is normal. No respiratory distress.     Breath sounds: Normal breath sounds.  Abdominal:     General: Bowel sounds are normal.     Palpations: Abdomen is soft.     Tenderness: There is abdominal tenderness in  the suprapubic area. There is no right CVA tenderness, left CVA tenderness, guarding or rebound.  Musculoskeletal:     Cervical back: Neck supple.  Skin:    General: Skin is warm and dry.     Findings: No rash.  Neurological:     Mental Status: She is alert.     Gait: Gait normal.  Psychiatric:        Mood and Affect: Mood normal.        Behavior: Behavior normal.      UC Treatments / Results  Labs (all labs ordered are listed, but only abnormal results are displayed) Labs Reviewed  POCT URINALYSIS DIP (MANUAL ENTRY) - Abnormal; Notable for the following components:      Result Value   Color, UA red (*)    Clarity, UA cloudy (*)    Glucose, UA =250 (*)    Bilirubin, UA moderate (*)    Ketones, POC UA small (15) (*)    Blood, UA trace-intact (*)    Protein Ur, POC >=300 (*)    Urobilinogen, UA >=8.0 (*)    Nitrite, UA Positive (*)    Leukocytes, UA Large (3+) (*)    All other components within normal limits  URINE CULTURE    EKG   Radiology No results found.  Procedures Procedures (including critical care time)  Medications Ordered in UC Medications - No data to display  Initial Impression / Assessment and Plan / UC Course  I have reviewed the triage vital signs and the nursing notes.  Pertinent labs & imaging results that were available during my care of the patient were reviewed by me and considered in my medical decision making (see chart for details).   UTI.  Treating with Macrobid.  Patient declines prescription for Zofran.  Instructed her to follow-up with her PCP if her symptoms are not improving.  Patient  agrees to plan of care.     Final Clinical Impressions(s) / UC Diagnoses   Final diagnoses:  Urinary tract infection without hematuria, site unspecified     Discharge Instructions      POCT urinalysis dipstick  04/13 1203 Color, UA red  Clarity, UA cloudy  Glucose =250  Bilirubin, UA moderate  Bilirubin, UA small (15)  Specific Gravity, UA 1.020  RBC, UA trace-intact  pH, UA 5.0  Protein,UA >=300  Urobilinogen, UA >=8.0  Nitrite, UA Positive  Leukocytes,UA Large (3+)   Take the Macrobid as directed.  Follow-up with your primary care provider if your symptoms are not improving.        ED Prescriptions    Medication Sig Dispense Auth. Provider   nitrofurantoin, macrocrystal-monohydrate, (MACROBID) 100 MG capsule Take 1 capsule (100 mg total) by mouth 2 (two) times daily. 10 capsule Sharion Balloon, NP     PDMP not reviewed this encounter.   Sharion Balloon, NP 02/22/20 1218

## 2020-02-22 NOTE — ED Triage Notes (Signed)
Pt reports burning with urination starting this morning.  Some nausea this morning.  + frequency with small output.  No abdominal pain.

## 2020-02-23 LAB — URINE CULTURE: Culture: NO GROWTH

## 2020-02-24 ENCOUNTER — Other Ambulatory Visit: Payer: Self-pay

## 2020-02-24 DIAGNOSIS — D509 Iron deficiency anemia, unspecified: Secondary | ICD-10-CM

## 2020-03-02 ENCOUNTER — Other Ambulatory Visit: Payer: Self-pay

## 2020-03-02 ENCOUNTER — Other Ambulatory Visit: Payer: Self-pay | Admitting: Internal Medicine

## 2020-03-02 MED ORDER — FUROSEMIDE 20 MG PO TABS: 20.0000 mg | ORAL_TABLET | Freq: Every day | ORAL | 0 refills | Status: DC

## 2020-03-22 ENCOUNTER — Encounter: Payer: Self-pay | Admitting: Gastroenterology

## 2020-03-26 ENCOUNTER — Other Ambulatory Visit: Payer: Self-pay

## 2020-03-27 MED ORDER — PREDNISONE 10 MG PO TABS: ORAL_TABLET | ORAL | 0 refills | Status: DC

## 2020-03-27 MED ORDER — FUROSEMIDE 20 MG PO TABS: 20.0000 mg | ORAL_TABLET | Freq: Every day | ORAL | 0 refills | Status: DC

## 2020-03-27 NOTE — Telephone Encounter (Signed)
Refill request for prednisone, last seen 09-25-19, last filled 12-19-19.  Please advise.

## 2020-03-28 ENCOUNTER — Other Ambulatory Visit: Payer: Self-pay | Admitting: Internal Medicine

## 2020-03-28 MED ORDER — ALPRAZOLAM 0.25 MG PO TABS: 0.2500 mg | ORAL_TABLET | Freq: Two times a day (BID) | ORAL | 0 refills | Status: DC | PRN

## 2020-03-28 MED ORDER — GABAPENTIN 300 MG PO CAPS: 300.0000 mg | ORAL_CAPSULE | Freq: Two times a day (BID) | ORAL | 1 refills | Status: DC

## 2020-03-31 ENCOUNTER — Telehealth: Payer: Self-pay

## 2020-03-31 NOTE — Telephone Encounter (Signed)
Called pt to confirm if she had COVID vaccination or COVID test required for her scheduled upcoming endo/colonoscopy.  No answer.  LMTCB.

## 2020-04-03 NOTE — Telephone Encounter (Signed)
Pt returned call and confirmed that she has completed COVID vaccination.

## 2020-04-03 NOTE — Telephone Encounter (Signed)
Called pt again to confirm if pt had COVID vaccination or COVID test required for upcoming procedure.  No answer.  LMTCB.

## 2020-04-04 ENCOUNTER — Ambulatory Visit (AMBULATORY_SURGERY_CENTER): Payer: 59 | Admitting: Gastroenterology

## 2020-04-04 ENCOUNTER — Encounter: Payer: Self-pay | Admitting: Gastroenterology

## 2020-04-04 VITALS — BP 107/64 | HR 64 | Temp 95.7°F | Resp 13 | Ht 64.0 in | Wt 179.0 lb

## 2020-04-04 DIAGNOSIS — K5730 Diverticulosis of large intestine without perforation or abscess without bleeding: Secondary | ICD-10-CM

## 2020-04-04 DIAGNOSIS — K3189 Other diseases of stomach and duodenum: Secondary | ICD-10-CM | POA: Diagnosis not present

## 2020-04-04 DIAGNOSIS — K219 Gastro-esophageal reflux disease without esophagitis: Secondary | ICD-10-CM | POA: Diagnosis not present

## 2020-04-04 DIAGNOSIS — Z1211 Encounter for screening for malignant neoplasm of colon: Secondary | ICD-10-CM | POA: Diagnosis not present

## 2020-04-04 DIAGNOSIS — K649 Unspecified hemorrhoids: Secondary | ICD-10-CM | POA: Diagnosis not present

## 2020-04-04 DIAGNOSIS — K317 Polyp of stomach and duodenum: Secondary | ICD-10-CM | POA: Diagnosis not present

## 2020-04-04 DIAGNOSIS — D509 Iron deficiency anemia, unspecified: Secondary | ICD-10-CM | POA: Diagnosis not present

## 2020-04-04 DIAGNOSIS — K208 Other esophagitis without bleeding: Secondary | ICD-10-CM | POA: Diagnosis not present

## 2020-04-04 MED ORDER — SODIUM CHLORIDE 0.9 % IV SOLN: 500.0000 mL | Freq: Once | INTRAVENOUS | Status: DC

## 2020-04-04 NOTE — Op Note (Signed)
[facility] Patient Name: Autumn Martinez Procedure Date: 04/04/2020 8:06 AM MRN: [id number] Endoscopist: Thornton Park MD, MD Age: 44 Referring MD:  Date of Birth: [birth year] Gender: Female Account #: [id number] Procedure:                Upper GI endoscopy Indications:              Unexplained iron deficiency anemia Medicines:                Monitored Anesthesia Care Procedure:                Pre-Anesthesia Assessment:                           - Prior to the procedure, a History and Physical                            was performed, and patient medications and                            allergies were reviewed. The patient's tolerance of                            previous anesthesia was also reviewed. The risks                            and benefits of the procedure and the sedation                            options and risks were discussed with the patient.                            All questions were answered, and informed consent                            was obtained. Prior Anticoagulants: The patient has                            taken no previous anticoagulant or antiplatelet                            agents. ASA Grade Assessment: II - A patient with                            mild systemic disease. After reviewing the risks                            and benefits, the patient was deemed in                            satisfactory condition to undergo the procedure.                           After obtaining informed consent, the endoscope was  passed under direct vision. Throughout the                            procedure, the patient's blood pressure, pulse, and                            oxygen saturations were monitored continuously. The                            Endoscope was introduced through the mouth, and                            advanced to the third part of duodenum. The upper                            GI endoscopy was  accomplished without difficulty.                            The patient tolerated the procedure well. Scope In: Scope Out: Findings:                 LA Grade A (one or more mucosal breaks less than 5                            mm, not extending between tops of 2 mucosal folds)                            esophagitis with no bleeding was found. Upper and                            mid esophagus had an appearance of                            hyperparakeratosis. Biopsies were taken from the                            mid/proximal and distal esophagus with a cold                            forceps for histology. Estimated blood loss was                            minimal.                           Diffuse minimal inflammation characterized by                            erythema, friability and granularity was found in                            the gastric body. Biopsies were taken from the  antrum, body, and fundus with a cold forceps for                            histology. Estimated blood loss was minimal.                           A few small sessile polyps were found in the                            gastric body. Biopsies were taken with a cold                            forceps for histology. Estimated blood loss was                            minimal.                           The examined duodenum was normal. Biopsies were                            taken with a cold forceps for histology. Estimated                            blood loss was minimal.                           The cardia and gastric fundus were normal on                            retroflexion.                           The exam was otherwise without abnormality. Complications:            No immediate complications. Estimated blood loss:                            Minimal. Estimated Blood Loss:     Estimated blood loss was minimal. Impression:               - LA Grade A esophagitis with no  bleeding. Biopsied.                           - Esophageal hyperparakeratosis.                           - Mild gastritis. Biopsied.                           - Gastric polyps - likely fundic gland polyps.                           - Normal examined duodenum. Biopsied.                           -  The examination was otherwise normal. Recommendation:           - Patient has a contact number available for                            emergencies. The signs and symptoms of potential                            delayed complications were discussed with the                            patient. Return to normal activities tomorrow.                            Written discharge instructions were provided to the                            patient.                           - Resume previous diet.                           - Continue present medications.                           - No aspirin, ibuprofen, naproxen, or other                            non-steroidal anti-inflammatory drugs.                           - Await pathology results.                           - Proceed with colonoscopy as previously planned. Thornton Park MD, MD 04/04/2020 8:41:32 AM This report has been signed electronically.

## 2020-04-04 NOTE — Progress Notes (Signed)
A and O x3. Report to RN. Tolerated MAC anesthesia well.Teeth unchanged after procedure.

## 2020-04-04 NOTE — Patient Instructions (Addendum)
Await biosy results   Handout on gastritis given to you today  NO ASPIRIN ,IBUPROFEN,NAPROXEN,OR OTHER NON- STEROIDAL ANTI INFLAMMATORY DRUGS  HANDOUT ON DIVERTICULOSIS & HEMORRHOIDS & HIGH FIBER DIET GIVEN TO YOU TODAY   YOU HAD AN ENDOSCOPIC PROCEDURE TODAY AT [facility]:   Refer to the procedure report that was given to you for any specific questions about what was found during the examination.  If the procedure report does not answer your questions, please call your gastroenterologist to clarify.  If you requested that your care partner not be given the details of your procedure findings, then the procedure report has been included in a sealed envelope for you to review at your convenience later.  YOU SHOULD EXPECT: Some feelings of bloating in the abdomen. Passage of more gas than usual.  Walking can help get rid of the air that was put into your GI tract during the procedure and reduce the bloating. If you had a lower endoscopy (such as a colonoscopy or flexible sigmoidoscopy) you may notice spotting of blood in your stool or on the toilet paper. If you underwent a bowel prep for your procedure, you may not have a normal bowel movement for a few days.  Please Note:  You might notice some irritation and congestion in your nose or some drainage.  This is from the oxygen used during your procedure.  There is no need for concern and it should clear up in a day or so.  SYMPTOMS TO REPORT IMMEDIATELY:   Following lower endoscopy (colonoscopy or flexible sigmoidoscopy):  Excessive amounts of blood in the stool  Significant tenderness or worsening of abdominal pains  Swelling of the abdomen that is new, acute  Fever of 100F or higher   Following upper endoscopy (EGD)  Vomiting of blood or coffee ground material  New chest pain or pain under the shoulder blades  Painful or persistently difficult swallowing  New shortness of breath  Fever of 100F or higher  Black,  tarry-looking stools  For urgent or emergent issues, a gastroenterologist can be reached at any hour by calling [phone]. Do not use MyChart messaging for urgent concerns.    DIET:  We do recommend a small meal at first, but then you may proceed to your regular diet.  Drink plenty of fluids but you should avoid alcoholic beverages for 24 hours.  ACTIVITY:  You should plan to take it easy for the rest of today and you should NOT DRIVE or use heavy machinery until tomorrow (because of the sedation medicines used during the test).    FOLLOW UP: Our staff will call the number listed on your records 48-72 hours following your procedure to check on you and address any questions or concerns that you may have regarding the information given to you following your procedure. If we do not reach you, we will leave a message.  We will attempt to reach you two times.  During this call, we will ask if you have developed any symptoms of COVID 19. If you develop any symptoms (ie: fever, flu-like symptoms, shortness of breath, cough etc.) before then, please call [phone].  If you test positive for Covid 19 in the 2 weeks post procedure, please call and report this information to [place].    If any biopsies were taken you will be contacted by phone or by letter within the next 1-3 weeks.  Please call us at [phone] if you have not heard about  the biopsies in 3 weeks.    SIGNATURES/CONFIDENTIALITY: You and/or your care partner have signed paperwork which will be entered into your electronic medical record.  These signatures attest to the fact that that the information above on your After Visit Summary has been reviewed and is understood.  Full responsibility of the confidentiality of this discharge information lies with you and/or your care-partner.

## 2020-04-04 NOTE — Op Note (Signed)
[facility] Patient Name: Autumn Martinez Procedure Date: 04/04/2020 8:05 AM MRN: [id number] Endoscopist: Thornton Park MD, MD Age: 44 Referring MD:  Date of Birth: 1976/03/09 Gender: Female Account #: [id number] Procedure:                Colonoscopy Indications:              Iron deficiency anemia                           No known family history of colon cancer or polyps Medicines:                Monitored Anesthesia Care Procedure:                Pre-Anesthesia Assessment:                           - Prior to the procedure, a History and Physical                            was performed, and patient medications and                            allergies were reviewed. The patient's tolerance of                            previous anesthesia was also reviewed. The risks                            and benefits of the procedure and the sedation                            options and risks were discussed with the patient.                            All questions were answered, and informed consent                            was obtained. Prior Anticoagulants: The patient has                            taken no previous anticoagulant or antiplatelet                            agents. ASA Grade Assessment: II - A patient with                            mild systemic disease. After reviewing the risks                            and benefits, the patient was deemed in                            satisfactory condition to undergo the procedure.  After obtaining informed consent, the colonoscope                            was passed under direct vision. Throughout the                            procedure, the patient's blood pressure, pulse, and                            oxygen saturations were monitored continuously. The                            Colonoscope was introduced through the anus and                            advanced to the 4 cm into the ileum.  A second                            forward view of the right colon was performed. The                            colonoscopy was performed without difficulty. The                            patient tolerated the procedure well. The quality                            of the bowel preparation was good. Scope In: 8:20:49 AM Scope Out: 8:31:54 AM Scope Withdrawal Time: 0 hours 8 minutes 36 seconds  Total Procedure Duration: 0 hours 11 minutes 5 seconds  Findings:                 The perianal and digital rectal examinations were                            normal.                           A few small-mouthed diverticula were found in the                            sigmoid colon.                           Non-bleeding internal hemorrhoids were found. The                            hemorrhoids were small.                           The exam was otherwise without abnormality on                            direct and retroflexion views. Complications:            No immediate complications. Estimated  Blood Loss:     Estimated blood loss: none. Impression:               - Diverticulosis in the sigmoid colon.                           - Non-bleeding internal hemorrhoids.                           - The examination was otherwise normal on direct                            and retroflexion views.                           - No source of anemia identified on this exam. Recommendation:           - Patient has a contact number available for                            emergencies. The signs and symptoms of potential                            delayed complications were discussed with the                            patient. Return to normal activities tomorrow.                            Written discharge instructions were provided to the                            patient.                           - Follow a high fiber diet. Drink at least 64                            ounces of water daily. Add a daily  stool bulking                            agent such as psyllium (an exampled would be                            Metamucil).                           - Continue present medications.                           - Repeat colonoscopy in 10 years for screening                            purposes, earlier with new symptoms.                           -  Emerging evidence supports eating a diet of                            fruits, vegetables, grains, calcium, and yogurt                            while reducing red meat and alcohol may reduce the                            risk of colon cancer.                           - Thank you for allowing me to be involved in your                            colon cancer prevention. Thornton Park MD, MD 04/04/2020 8:45:27 AM This report has been signed electronically.

## 2020-04-04 NOTE — Progress Notes (Signed)
Called to room to assist during endoscopic procedure.  Patient ID and intended procedure confirmed with present staff. Received instructions for my participation in the procedure from the performing physician.  

## 2020-04-04 NOTE — Progress Notes (Signed)
Temp-SS VS-DT

## 2020-04-05 ENCOUNTER — Other Ambulatory Visit: Payer: Self-pay | Admitting: Internal Medicine

## 2020-04-05 DIAGNOSIS — K219 Gastro-esophageal reflux disease without esophagitis: Secondary | ICD-10-CM

## 2020-04-06 ENCOUNTER — Telehealth: Payer: Self-pay

## 2020-04-06 ENCOUNTER — Telehealth: Payer: Self-pay | Admitting: *Deleted

## 2020-04-06 NOTE — Telephone Encounter (Signed)
Left message on follow up call. 

## 2020-04-06 NOTE — Telephone Encounter (Signed)
Follow up call made, no answer, left message 

## 2020-04-11 ENCOUNTER — Encounter: Payer: Self-pay | Admitting: Gastroenterology

## 2020-04-20 ENCOUNTER — Other Ambulatory Visit: Payer: Self-pay | Admitting: Internal Medicine

## 2020-04-20 ENCOUNTER — Other Ambulatory Visit: Payer: Self-pay

## 2020-04-20 ENCOUNTER — Ambulatory Visit: Payer: 59 | Admitting: Internal Medicine

## 2020-04-20 ENCOUNTER — Encounter: Payer: Self-pay | Admitting: Internal Medicine

## 2020-04-20 VITALS — BP 136/90 | HR 78 | Temp 98.2°F | Ht 64.0 in | Wt 166.0 lb

## 2020-04-20 DIAGNOSIS — F419 Anxiety disorder, unspecified: Secondary | ICD-10-CM | POA: Insufficient documentation

## 2020-04-20 DIAGNOSIS — R011 Cardiac murmur, unspecified: Secondary | ICD-10-CM | POA: Diagnosis not present

## 2020-04-20 DIAGNOSIS — F339 Major depressive disorder, recurrent, unspecified: Secondary | ICD-10-CM

## 2020-04-20 MED ORDER — CITALOPRAM HYDROBROMIDE 10 MG PO TABS: 10.0000 mg | ORAL_TABLET | Freq: Every day | ORAL | 3 refills | Status: DC

## 2020-04-20 NOTE — Progress Notes (Signed)
Chief Complaint  Patient presents with  . Anxiety   F/u  1. Recurrent depression and anxiety ptsd from teenage years phq 9 score 10 and gad 7 14 tried zoloft but did not feel well, effexor high dose made her feel numb. Tried prozac and paxil in the past when was 44 y.o does not take xanax until she travels and does not want to consistently. She has been on and off meds since age 81 y.o  She notices hot flashes at night and wears light clothes and thinks mood is hormonal starting the week before cycle but now x 2 months lasting after cycle. Had prior therapy and doing CBT techniques on her own Anxiety is worse than depression   Review of Systems  Respiratory: Negative for shortness of breath.   Cardiovascular: Negative for chest pain.  Gastrointestinal: Negative for abdominal pain.  Psychiatric/Behavioral: Positive for depression. The patient is nervous/anxious.    Past Medical History:  Diagnosis Date  . Allergy   . Anxiety   . Depression   . GERD (gastroesophageal reflux disease)   . Heart murmur   . Hypertension   . IDA (iron deficiency anemia)    Past Surgical History:  Procedure Laterality Date  . BREAST BIOPSY Right 2018   benign  . CESAREAN SECTION  2002  . CESAREAN SECTION  2007  . CHOLECYSTECTOMY  2005  . TONSILLECTOMY AND ADENOIDECTOMY Bilateral 1980   Family History  Problem Relation Age of Onset  . Hypertension Mother   . Diabetes Mother 73  . Hypertension Father   . Brain cancer Father   . Cancer Maternal Aunt   . Breast cancer Maternal Aunt 48  . Mental retardation Maternal Uncle   . Colon cancer Neg Hx   . Esophageal cancer Neg Hx   . Stomach cancer Neg Hx   . Rectal cancer Neg Hx    Social History   Socioeconomic History  . Marital status: Married    Spouse name: Not on file  . Number of children: Not on file  . Years of education: Not on file  . Highest education level: Not on file  Occupational History  . Not on file  Tobacco Use  . Smoking  status: Never Smoker  . Smokeless tobacco: Never Used  Vaping Use  . Vaping Use: Never used  Substance and Sexual Activity  . Alcohol use: No  . Drug use: No  . Sexual activity: Yes  Other Topics Concern  . Not on file  Social History Narrative  . Not on file   Social Determinants of Health   Financial Resource Strain:   . Difficulty of Paying Living Expenses:   Food Insecurity:   . Worried About Charity fundraiser in the Last Year:   . Arboriculturist in the Last Year:   Transportation Needs:   . Film/video editor (Medical):   Marland Kitchen Lack of Transportation (Non-Medical):   Physical Activity:   . Days of Exercise per Week:   . Minutes of Exercise per Session:   Stress:   . Feeling of Stress :   Social Connections:   . Frequency of Communication with Friends and Family:   . Frequency of Social Gatherings with Friends and Family:   . Attends Religious Services:   . Active Member of Clubs or Organizations:   . Attends Archivist Meetings:   Marland Kitchen Marital Status:   Intimate Partner Violence:   . Fear of Current or Ex-Partner:   .  Emotionally Abused:   Marland Kitchen Physically Abused:   . Sexually Abused:    Current Meds  Medication Sig  . amLODipine (NORVASC) 2.5 MG tablet TAKE 1 TABLET BY MOUTH ONCE DAILY  . clindamycin-benzoyl peroxide (BENZACLIN) gel Apply topically two times daily as needed  . EPINEPHrine 0.3 mg/0.3 mL IJ SOAJ injection Inject 0.3 mLs (0.3 mg total) into the muscle once. (Patient taking differently: Inject 0.3 mg into the muscle once as needed. )  . ferrous sulfate (EQL SLOW RELEASE IRON) 160 (50 Fe) MG TBCR SR tablet Take 160 mg by mouth daily.  . furosemide (LASIX) 20 MG tablet Take 1 tablet (20 mg total) by mouth daily.  Marland Kitchen gabapentin (NEURONTIN) 300 MG capsule Take 1 capsule (300 mg total) by mouth 2 (two) times daily.  Marland Kitchen losartan (COZAAR) 100 MG tablet Take 1 tablet (100 mg total) by mouth daily.  . Multiple Vitamins-Minerals (MULTIVITAMIN WITH  MINERALS) tablet Take 1 tablet by mouth daily.  Marland Kitchen omeprazole (PRILOSEC) 40 MG capsule TAKE 1 CAPSULE BY MOUTH ONCE DAILY   Allergies  Allergen Reactions  . Butorphanol Tartrate   . Sulfonamide Derivatives   . Hctz [Hydrochlorothiazide] Rash  . Other Rash   Recent Results (from the past 2160 hour(s))  Transferrin Saturation     Status: Abnormal   Collection Time: 02/10/20  3:48 PM  Result Value Ref Range   IRON SERPL-MCNC 146 (H) ug/dL    Comment: Reference Range: >=10y: 37 - 145    IRON SATN MFR SERPL 47 % Saturation    Comment: Reference Range: Children and Adults: 15 - 55    TRANSFERRIN SERPL-MCNC 220 mg/dL    Comment: Reference Range: Children and Adults:  200 - 370   Ferritin     Status: None   Collection Time: 02/10/20  3:48 PM  Result Value Ref Range   Ferritin 22.9 10.0 - 291.0 ng/mL  Iron     Status: Abnormal   Collection Time: 02/10/20  3:48 PM  Result Value Ref Range   Iron 149 (H) 42 - 145 ug/dL  Urine Culture     Status: None   Collection Time: 02/22/20 12:03 PM   Specimen: Urine, Random  Result Value Ref Range   Specimen Description URINE, RANDOM    Special Requests NONE    Culture      NO GROWTH Performed at [facility], [address] [place], [place] [address]    Report Status 02/23/2020 FINAL   POCT urinalysis dipstick     Status: Abnormal   Collection Time: 02/22/20 12:03 PM  Result Value Ref Range   Color, UA red (A) yellow   Clarity, UA cloudy (A) clear   Glucose, UA =250 (A) negative mg/dL   Bilirubin, UA moderate (A) negative   Ketones, POC UA small (15) (A) negative mg/dL   Spec Grav, UA 1.020 1.010 - 1.025   Blood, UA trace-intact (A) negative   pH, UA 5.0 5.0 - 8.0   Protein Ur, POC >=300 (A) negative mg/dL   Urobilinogen, UA >=8.0 (A) 0.2 or 1.0 E.U./dL   Nitrite, UA Positive (A) Negative   Leukocytes, UA Large (3+) (A) Negative   Objective  Body mass index is 28.49 kg/m. Wt Readings from Last 3 Encounters:  04/20/20  166 lb (75.3 kg)  04/04/20 179 lb (81.2 kg)  02/10/20 179 lb (81.2 kg)   Temp Readings from Last 3 Encounters:  04/20/20 98.2 F (36.8 C) (Oral)  04/04/20 (!) 95.7 F (35.4  C) (Temporal)  02/22/20 98.1 F (36.7 C) (Oral)   BP Readings from Last 3 Encounters:  04/20/20 136/90  04/04/20 107/64  02/22/20 (!) 141/89   Pulse Readings from Last 3 Encounters:  04/20/20 78  04/04/20 64  02/22/20 74    Physical Exam Vitals and nursing note reviewed.  Constitutional:      Appearance: Normal appearance. She is well-developed and well-groomed.  HENT:     Head: Normocephalic and atraumatic.  Eyes:     Conjunctiva/sclera: Conjunctivae normal.     Pupils: Pupils are equal, round, and reactive to light.  Cardiovascular:     Rate and Rhythm: Normal rate and regular rhythm.     Heart sounds: Murmur heard.   Pulmonary:     Effort: Pulmonary effort is normal.     Breath sounds: Normal breath sounds.  Skin:    General: Skin is warm and dry.  Neurological:     General: No focal deficit present.     Mental Status: She is alert and oriented to person, place, and time. Mental status is at baseline.     Gait: Gait normal.  Psychiatric:        Attention and Perception: Attention and perception normal.        Mood and Affect: Mood and affect normal.        Speech: Speech normal.        Behavior: Behavior normal. Behavior is cooperative.        Thought Content: Thought content normal.        Cognition and Memory: Cognition and memory normal.        Judgment: Judgment normal.     Assessment  Plan  Depression, recurrent ([place]) - Plan: citalopram (CELEXA) 10 MG tablet Anxiety - Plan: citalopram (CELEXA) 10 MG tablet Given [facility] # and other therapists female locally today  Continue hiking  Disc L theanine as well celexa 10 mg qhich can help vasomotor sxs, GAD/PTSD, PMDD, recurrent depression  My chart in 4-6 weeks how doing and f/u pcp 08/2020  Cardiac murmur F/u Dr. Rockey Situ yearly    Provider: Dr. Olivia Mackie McLean-Scocuzza-Internal Medicine

## 2020-04-20 NOTE — Patient Instructions (Addendum)
Stress relax brand Tranquil Sleep  L theanine/Melatonin/5HT   [facility] schedule own appt    Message in 4-6 weeks to f/u   Citalopram tablets What is this medicine? CITALOPRAM (sye TAL oh pram) is a medicine for depression. This medicine may be used for other purposes; ask your health care provider or pharmacist if you have questions. COMMON BRAND NAME(S): Celexa What should I tell my health care provider before I take this medicine? They need to know if you have any of these conditions:  bleeding disorders  bipolar disorder or a family history of bipolar disorder  glaucoma  heart disease  history of irregular heartbeat  kidney disease  liver disease  low levels of magnesium or potassium in the blood  receiving electroconvulsive therapy  seizures  suicidal thoughts, plans, or attempt; a previous suicide attempt by you or a family member  take medicines that treat or prevent blood clots  thyroid disease  an unusual or allergic reaction to citalopram, escitalopram, other medicines, foods, dyes, or preservatives  pregnant or trying to become pregnant  breast-feeding How should I use this medicine? Take this medicine by mouth with a glass of water. Follow the directions on the prescription label. You can take it with or without food. Take your medicine at regular intervals. Do not take your medicine more often than directed. Do not stop taking this medicine suddenly except upon the advice of your doctor. Stopping this medicine too quickly may cause serious side effects or your condition may worsen. A special MedGuide will be given to you by the pharmacist with each prescription and refill. Be sure to read this information carefully each time. Talk to your pediatrician regarding the use of this medicine in children. Special care may be needed. Patients over 30 years old may have a stronger reaction and need a smaller dose. Overdosage: If you think you have taken too  much of this medicine contact a poison control center or emergency room at once. NOTE: This medicine is only for you. Do not share this medicine with others. What if I miss a dose? If you miss a dose, take it as soon as you can. If it is almost time for your next dose, take only that dose. Do not take double or extra doses. What may interact with this medicine? Do not take this medicine with any of the following medications:  certain medicines for fungal infections like fluconazole, itraconazole, ketoconazole, posaconazole, voriconazole  cisapride  dronedarone  escitalopram  linezolid  MAOIs like Carbex, Eldepryl, Marplan, Nardil, and Parnate  methylene blue (injected into a vein)  pimozide  thioridazine This medicine may also interact with the following medications:  alcohol  amphetamines  aspirin and aspirin-like medicines  carbamazepine  certain medicines for depression, anxiety, or psychotic disturbances  certain medicines for infections like chloroquine, clarithromycin, erythromycin, furazolidone, isoniazid, pentamidine  certain medicines for migraine headaches like almotriptan, eletriptan, frovatriptan, naratriptan, rizatriptan, sumatriptan, zolmitriptan  certain medicines for sleep  certain medicines that treat or prevent blood clots like dalteparin, enoxaparin, warfarin  cimetidine  diuretics  dofetilide  fentanyl  lithium  methadone  metoprolol  NSAIDs, medicines for pain and inflammation, like ibuprofen or naproxen  omeprazole  other medicines that prolong the QT interval (cause an abnormal heart rhythm)  procarbazine  rasagiline  supplements like St. John's wort, kava kava, valerian  tramadol  tryptophan  ziprasidone This list may not describe all possible interactions. Give your health care provider a list of all the  medicines, herbs, non-prescription drugs, or dietary supplements you use. Also tell them if you smoke, drink  alcohol, or use illegal drugs. Some items may interact with your medicine. What should I watch for while using this medicine? Tell your doctor if your symptoms do not get better or if they get worse. Visit your doctor or health care professional for regular checks on your progress. Because it may take several weeks to see the full effects of this medicine, it is important to continue your treatment as prescribed by your doctor. Patients and their families should watch out for new or worsening thoughts of suicide or depression. Also watch out for sudden changes in feelings such as feeling anxious, agitated, panicky, irritable, hostile, aggressive, impulsive, severely restless, overly excited and hyperactive, or not being able to sleep. If this happens, especially at the beginning of treatment or after a change in dose, call your health care professional. Dennis Bast may get drowsy or dizzy. Do not drive, use machinery, or do anything that needs mental alertness until you know how this medicine affects you. Do not stand or sit up quickly, especially if you are an older patient. This reduces the risk of dizzy or fainting spells. Alcohol may interfere with the effect of this medicine. Avoid alcoholic drinks. Your mouth may get dry. Chewing sugarless gum or sucking hard candy, and drinking plenty of water will help. Contact your doctor if the problem does not go away or is severe. What side effects may I notice from receiving this medicine? Side effects that you should report to your doctor or health care professional as soon as possible:  allergic reactions like skin rash, itching or hives, swelling of the face, lips, or tongue  anxious  black, tarry stools  breathing problems  changes in vision  chest pain  confusion  elevated mood, decreased need for sleep, racing thoughts, impulsive behavior  eye pain  fast, irregular heartbeat  feeling faint or lightheaded, falls  feeling agitated, angry, or  irritable  hallucination, loss of contact with reality  loss of balance or coordination  loss of memory  painful or prolonged erections  restlessness, pacing, inability to keep still  seizures  stiff muscles  suicidal thoughts or other mood changes  trouble sleeping  unusual bleeding or bruising  unusually weak or tired  vomiting Side effects that usually do not require medical attention (report to your doctor or health care professional if they continue or are bothersome):  change in appetite or weight  change in sex drive or performance  dizziness  headache  increased sweating  indigestion, nausea  tremors This list may not describe all possible side effects. Call your doctor for medical advice about side effects. You may report side effects to FDA at 1-800-FDA-1088. Where should I keep my medicine? Keep out of reach of children. Store at room temperature between 15 and 30 degrees C (59 and 86 degrees F). Throw away any unused medicine after the expiration date. NOTE: This sheet is a summary. It may not cover all possible information. If you have questions about this medicine, talk to your doctor, pharmacist, or health care provider.  2020 Elsevier/Gold Standard (2018-10-19 09:05:36)  Mindfulness-Based Stress Reduction Mindfulness-based stress reduction (MBSR) is a program that helps people learn to practice mindfulness. Mindfulness is the practice of intentionally paying attention to the present moment. It can be learned and practiced through techniques such as education, breathing exercises, meditation, and yoga. MBSR includes several mindfulness techniques in one program. MBSR  works best when you understand the treatment, are willing to try new things, and can commit to spending time practicing what you learn. MBSR training may include learning about:  How your emotions, thoughts, and reactions affect your body.  New ways to respond to things that cause  negative thoughts to start (triggers).  How to notice your thoughts and let go of them.  Practicing awareness of everyday things that you normally do without thinking.  The techniques and goals of different types of meditation. What are the benefits of MBSR? MBSR can have many benefits, which include helping you to:  Develop self-awareness. This refers to knowing and understanding yourself.  Learn skills and attitudes that help you to participate in your own health care.  Learn new ways to care for yourself.  Be more accepting about how things are, and let things go.  Be less judgmental and approach things with an open mind.  Be patient with yourself and trust yourself more. MBSR has also been shown to:  Reduce negative emotions, such as depression and anxiety.  Improve memory and focus.  Change how you sense and approach pain.  Boost your body's ability to fight infections.  Help you connect better with other people.  Improve your sense of well-being. Follow these instructions at home:   Find a local in-person or online MBSR program.  Set aside some time regularly for mindfulness practice.  Find a mindfulness practice that works best for you. This may include one or more of the following: ? Meditation. Meditation involves focusing your mind on a certain thought or activity. ? Breathing awareness exercises. These help you to stay present by focusing on your breath. ? Body scan. For this practice, you lie down and pay attention to each part of your body from head to toe. You can identify tension and soreness and intentionally relax parts of your body. ? Yoga. Yoga involves stretching and breathing, and it can improve your ability to move and be flexible. It can also provide an experience of testing your body's limits, which can help you release stress. ? Mindful eating. This way of eating involves focusing on the taste, texture, color, and smell of each bite of food.  Because this slows down eating and helps you feel full sooner, it can be an important part of a weight-loss plan.  Find a podcast or recording that provides guidance for breathing awareness, body scan, or meditation exercises. You can listen to these any time when you have a free moment to rest without distractions.  Follow your treatment plan as told by your health care provider. This may include taking regular medicines and making changes to your diet or lifestyle as recommended. How to practice mindfulness To do a basic awareness exercise:  Find a comfortable place to sit.  Pay attention to the present moment. Observe your thoughts, feelings, and surroundings just as they are.  Avoid placing judgment on yourself, your feelings, or your surroundings. Make note of any judgment that comes up, and let it go.  Your mind may wander, and that is okay. Make note of when your thoughts drift, and return your attention to the present moment. To do basic mindfulness meditation:  Find a comfortable place to sit. This may include a stable chair or a firm floor cushion. ? Sit upright with your back straight. Let your arms fall next to your side with your hands resting on your legs. ? If sitting in a chair, rest your feet flat  on the floor. ? If sitting on a cushion, cross your legs in front of you.  Keep your head in a neutral position with your chin dropped slightly. Relax your jaw and rest the tip of your tongue on the roof of your mouth. Drop your gaze to the floor. You can close your eyes if you like.  Breathe normally and pay attention to your breath. Feel the air moving in and out of your nose. Feel your belly expanding and relaxing with each breath.  Your mind may wander, and that is okay. Make note of when your thoughts drift, and return your attention to your breath.  Avoid placing judgment on yourself, your feelings, or your surroundings. Make note of any judgment or feelings that come up,  let them go, and bring your attention back to your breath.  When you are ready, lift your gaze or open your eyes. Pay attention to how your body feels after the meditation. Where to find more information You can find more information about MBSR from:  Your health care provider.  Community-based meditation centers or programs.  Programs offered near you. Summary  Mindfulness-based stress reduction (MBSR) is a program that teaches you how to intentionally pay attention to the present moment. It is used with other treatments to help you cope better with daily stress, emotions, and pain.  MBSR focuses on developing self-awareness, which allows you to respond to life stress without judgment or negative emotions.  MBSR programs may involve learning different mindfulness practices, such as breathing exercises, meditation, yoga, body scan, or mindful eating. Find a mindfulness practice that works best for you, and set aside time for it on a regular basis. This information is not intended to replace advice given to you by your health care provider. Make sure you discuss any questions you have with your health care provider. Document Revised: 10/10/2017 Document Reviewed: 03/06/2017 Elsevier Patient Education  [place].   Generalized Anxiety Disorder, Adult Generalized anxiety disorder (GAD) is a mental health disorder. People with this condition constantly worry about everyday events. Unlike normal anxiety, worry related to GAD is not triggered by a specific event. These worries also do not fade or get better with time. GAD interferes with life functions, including relationships, work, and school. GAD can vary from mild to severe. People with severe GAD can have intense waves of anxiety with physical symptoms (panic attacks). What are the causes? The exact cause of GAD is not known. What increases the risk? This condition is more likely to develop in:  Women.  People who have a  family history of anxiety disorders.  People who are very shy.  People who experience very stressful life events, such as the death of a loved one.  People who have a very stressful family environment. What are the signs or symptoms? People with GAD often worry excessively about many things in their lives, such as their health and family. They may also be overly concerned about:  Doing well at work.  Being on time.  Natural disasters.  Friendships. Physical symptoms of GAD include:  Fatigue.  Muscle tension or having muscle twitches.  Trembling or feeling shaky.  Being easily startled.  Feeling like your heart is pounding or racing.  Feeling out of breath or like you cannot take a deep breath.  Having trouble falling asleep or staying asleep.  Sweating.  Nausea, diarrhea, or irritable bowel syndrome (IBS).  Headaches.  Trouble concentrating or remembering facts.  Restlessness.  Irritability. How is this diagnosed? Your health care provider can diagnose GAD based on your symptoms and medical history. You will also have a physical exam. The health care provider will ask specific questions about your symptoms, including how severe they are, when they started, and if they come and go. Your health care provider may ask you about your use of alcohol or drugs, including prescription medicines. Your health care provider may refer you to a mental health specialist for further evaluation. Your health care provider will do a thorough examination and may perform additional tests to rule out other possible causes of your symptoms. To be diagnosed with GAD, a person must have anxiety that:  Is out of his or her control.  Affects several different aspects of his or her life, such as work and relationships.  Causes distress that makes him or her unable to take part in normal activities.  Includes at least three physical symptoms of GAD, such as restlessness, fatigue, trouble  concentrating, irritability, muscle tension, or sleep problems. Before your health care provider can confirm a diagnosis of GAD, these symptoms must be present more days than they are not, and they must last for six months or longer. How is this treated? The following therapies are usually used to treat GAD:  Medicine. Antidepressant medicine is usually prescribed for long-term daily control. Antianxiety medicines may be added in severe cases, especially when panic attacks occur.  Talk therapy (psychotherapy). Certain types of talk therapy can be helpful in treating GAD by providing support, education, and guidance. Options include: ? Cognitive behavioral therapy (CBT). People learn coping skills and techniques to ease their anxiety. They learn to identify unrealistic or negative thoughts and behaviors and to replace them with positive ones. ? Acceptance and commitment therapy (ACT). This treatment teaches people how to be mindful as a way to cope with unwanted thoughts and feelings. ? Biofeedback. This process trains you to manage your body's response (physiological response) through breathing techniques and relaxation methods. You will work with a therapist while machines are used to monitor your physical symptoms.  Stress management techniques. These include yoga, meditation, and exercise. A mental health specialist can help determine which treatment is best for you. Some people see improvement with one type of therapy. However, other people require a combination of therapies. Follow these instructions at home:  Take over-the-counter and prescription medicines only as told by your health care provider.  Try to maintain a normal routine.  Try to anticipate stressful situations and allow extra time to manage them.  Practice any stress management or self-calming techniques as taught by your health care provider.  Do not punish yourself for setbacks or for not making progress.  Try to  recognize your accomplishments, even if they are small.  Keep all follow-up visits as told by your health care provider. This is important. Contact a health care provider if:  Your symptoms do not get better.  Your symptoms get worse.  You have signs of depression, such as: ? A persistently sad, cranky, or irritable mood. ? Loss of enjoyment in activities that used to bring you joy. ? Change in weight or eating. ? Changes in sleeping habits. ? Avoiding friends or family members. ? Loss of energy for normal tasks. ? Feelings of guilt or worthlessness. Get help right away if:  You have serious thoughts about hurting yourself or others. If you ever feel like you may hurt yourself or others, or have thoughts about taking your own  life, get help right away. You can go to your nearest emergency department or call:  Your local emergency services (911 in the [place]  A suicide crisis helpline, such as the [place] at (239)017-1417. This is open 24 hours a day. Summary  Generalized anxiety disorder (GAD) is a mental health disorder that involves worry that is not triggered by a specific event.  People with GAD often worry excessively about many things in their lives, such as their health and family.  GAD may cause physical symptoms such as restlessness, trouble concentrating, sleep problems, frequent sweating, nausea, diarrhea, headaches, and trembling or muscle twitching.  A mental health specialist can help determine which treatment is best for you. Some people see improvement with one type of therapy. However, other people require a combination of therapies. This information is not intended to replace advice given to you by your health care provider. Make sure you discuss any questions you have with your health care provider. Document Revised: 10/10/2017 Document Reviewed: 09/17/2016 Elsevier Patient Education  [place] DASH  stands for "Dietary Approaches to Stop Hypertension." The DASH eating plan is a healthy eating plan that has been shown to reduce high blood pressure (hypertension). It may also reduce your risk for type 2 diabetes, heart disease, and stroke. The DASH eating plan may also help with weight loss. What are tips for following this plan?  General guidelines  Avoid eating more than 2,300 mg (milligrams) of salt (sodium) a day. If you have hypertension, you may need to reduce your sodium intake to 1,500 mg a day.  Limit alcohol intake to no more than 1 drink a day for nonpregnant women and 2 drinks a day for men. One drink equals 12 oz of beer, 5 oz of wine, or 1 oz of hard liquor.  Work with your health care provider to maintain a healthy body weight or to lose weight. Ask what an ideal weight is for you.  Get at least 30 minutes of exercise that causes your heart to beat faster (aerobic exercise) most days of the week. Activities may include walking, swimming, or biking.  Work with your health care provider or diet and nutrition specialist (dietitian) to adjust your eating plan to your individual calorie needs. Reading food labels   Check food labels for the amount of sodium per serving. Choose foods with less than 5 percent of the Daily Value of sodium. Generally, foods with less than 300 mg of sodium per serving fit into this eating plan.  To find whole grains, look for the word "whole" as the first word in the ingredient list. Shopping  Buy products labeled as "low-sodium" or "no salt added."  Buy fresh foods. Avoid canned foods and premade or frozen meals. Cooking  Avoid adding salt when cooking. Use salt-free seasonings or herbs instead of table salt or sea salt. Check with your health care provider or pharmacist before using salt substitutes.  Do not fry foods. Cook foods using healthy methods such as baking, boiling, grilling, and broiling instead.  Cook with heart-healthy oils,  such as olive, canola, soybean, or sunflower oil. Meal planning  Eat a balanced diet that includes: ? 5 or more servings of fruits and vegetables each day. At each meal, try to fill half of your plate with fruits and vegetables. ? Up to 6-8 servings of whole grains each day. ? Less than 6 oz of lean meat, poultry, or fish each day. A  3-oz serving of meat is about the same size as a deck of cards. One egg equals 1 oz. ? 2 servings of low-fat dairy each day. ? A serving of nuts, seeds, or beans 5 times each week. ? Heart-healthy fats. Healthy fats called Omega-3 fatty acids are found in foods such as flaxseeds and coldwater fish, like sardines, salmon, and mackerel.  Limit how much you eat of the following: ? Canned or prepackaged foods. ? Food that is high in trans fat, such as fried foods. ? Food that is high in saturated fat, such as fatty meat. ? Sweets, desserts, sugary drinks, and other foods with added sugar. ? Full-fat dairy products.  Do not salt foods before eating.  Try to eat at least 2 vegetarian meals each week.  Eat more home-cooked food and less restaurant, buffet, and fast food.  When eating at a restaurant, ask that your food be prepared with less salt or no salt, if possible. What foods are recommended? The items listed may not be a complete list. Talk with your dietitian about what dietary choices are best for you. Grains Whole-grain or whole-wheat bread. Whole-grain or whole-wheat pasta. Brown rice. [place]. Bulgur. Whole-grain and low-sodium cereals. Pita bread. Low-fat, low-sodium crackers. Whole-wheat flour tortillas. Vegetables Fresh or frozen vegetables (raw, steamed, roasted, or grilled). Low-sodium or reduced-sodium tomato and vegetable juice. Low-sodium or reduced-sodium tomato sauce and tomato paste. Low-sodium or reduced-sodium canned vegetables. Fruits All fresh, dried, or frozen fruit. Canned fruit in natural juice (without added sugar). Meat  and other protein foods Skinless chicken or [place]. Ground chicken or [place]. Pork with fat trimmed off. Fish and seafood. Egg whites. Dried beans, peas, or lentils. Unsalted nuts, nut butters, and seeds. Unsalted canned beans. Lean cuts of beef with fat trimmed off. Low-sodium, lean deli meat. Dairy Low-fat (1%) or fat-free (skim) milk. Fat-free, low-fat, or reduced-fat cheeses. Nonfat, low-sodium ricotta or cottage cheese. Low-fat or nonfat yogurt. Low-fat, low-sodium cheese. Fats and oils Soft margarine without trans fats. Vegetable oil. Low-fat, reduced-fat, or light mayonnaise and salad dressings (reduced-sodium). Canola, safflower, olive, soybean, and sunflower oils. Avocado. Seasoning and other foods Herbs. Spices. Seasoning mixes without salt. Unsalted popcorn and pretzels. Fat-free sweets. What foods are not recommended? The items listed may not be a complete list. Talk with your dietitian about what dietary choices are best for you. Grains Baked goods made with fat, such as croissants, muffins, or some breads. Dry pasta or rice meal packs. Vegetables Creamed or fried vegetables. Vegetables in a cheese sauce. Regular canned vegetables (not low-sodium or reduced-sodium). Regular canned tomato sauce and paste (not low-sodium or reduced-sodium). Regular tomato and vegetable juice (not low-sodium or reduced-sodium). Angie Fava. Olives. Fruits Canned fruit in a light or heavy syrup. Fried fruit. Fruit in cream or butter sauce. Meat and other protein foods Fatty cuts of meat. Ribs. Fried meat. Berniece Salines. Sausage. Bologna and other processed lunch meats. Salami. Fatback. Hotdogs. Bratwurst. Salted nuts and seeds. Canned beans with added salt. Canned or smoked fish. Whole eggs or egg yolks. Chicken or [place] with skin. Dairy Whole or 2% milk, cream, and half-and-half. Whole or full-fat cream cheese. Whole-fat or sweetened yogurt. Full-fat cheese. Nondairy creamers. Whipped toppings. Processed cheese and  cheese spreads. Fats and oils Butter. Stick margarine. Lard. Shortening. Ghee. Bacon fat. Tropical oils, such as coconut, palm kernel, or palm oil. Seasoning and other foods Salted popcorn and pretzels. Onion salt, garlic salt, seasoned salt, table salt, and sea salt. Worcestershire sauce. Tartar sauce.  Barbecue sauce. Teriyaki sauce. Soy sauce, including reduced-sodium. Steak sauce. Canned and packaged gravies. Fish sauce. Oyster sauce. Cocktail sauce. Horseradish that you find on the shelf. Ketchup. Mustard. Meat flavorings and tenderizers. Bouillon cubes. Hot sauce and Tabasco sauce. Premade or packaged marinades. Premade or packaged taco seasonings. Relishes. Regular salad dressings. Where to find more information:  National Heart, Lung, and [place]: https://wilson-eaton.com/  American Heart Association: www.heart.org Summary  The DASH eating plan is a healthy eating plan that has been shown to reduce high blood pressure (hypertension). It may also reduce your risk for type 2 diabetes, heart disease, and stroke.  With the DASH eating plan, you should limit salt (sodium) intake to 2,300 mg a day. If you have hypertension, you may need to reduce your sodium intake to 1,500 mg a day.  When on the DASH eating plan, aim to eat more fresh fruits and vegetables, whole grains, lean proteins, low-fat dairy, and heart-healthy fats.  Work with your health care provider or diet and nutrition specialist (dietitian) to adjust your eating plan to your individual calorie needs. This information is not intended to replace advice given to you by your health care provider. Make sure you discuss any questions you have with your health care provider. Document Revised: 10/10/2017 Document Reviewed: 10/21/2016 Elsevier Patient Education  2020 [employer].

## 2020-05-06 DIAGNOSIS — L989 Disorder of the skin and subcutaneous tissue, unspecified: Secondary | ICD-10-CM

## 2020-05-09 MED ORDER — PREDNISONE 10 MG PO TABS: ORAL_TABLET | ORAL | 0 refills | Status: DC

## 2020-06-22 ENCOUNTER — Other Ambulatory Visit: Payer: Self-pay | Admitting: Internal Medicine

## 2020-06-22 MED ORDER — LOSARTAN POTASSIUM 100 MG PO TABS: 100.0000 mg | ORAL_TABLET | Freq: Every day | ORAL | 3 refills | Status: DC

## 2020-07-25 DIAGNOSIS — Z03818 Encounter for observation for suspected exposure to other biological agents ruled out: Secondary | ICD-10-CM | POA: Diagnosis not present

## 2020-07-25 DIAGNOSIS — Z1152 Encounter for screening for COVID-19: Secondary | ICD-10-CM | POA: Diagnosis not present

## 2020-08-16 ENCOUNTER — Other Ambulatory Visit: Payer: Self-pay

## 2020-08-17 ENCOUNTER — Other Ambulatory Visit: Payer: Self-pay | Admitting: Internal Medicine

## 2020-08-17 MED ORDER — FUROSEMIDE 20 MG PO TABS: 20.0000 mg | ORAL_TABLET | Freq: Every day | ORAL | 0 refills | Status: DC

## 2020-08-21 ENCOUNTER — Ambulatory Visit: Payer: 59 | Admitting: Internal Medicine

## 2020-08-24 ENCOUNTER — Other Ambulatory Visit: Payer: Self-pay | Admitting: Internal Medicine

## 2020-09-05 IMAGING — MG MM DIGITAL SCREENING BILAT W/ TOMO W/ CAD
6 of 10 series · 6 of 30 positions shown · non-contrast
Comparison: Previous exam(s).

CLINICAL DATA: Screening.

EXAM:
DIGITAL SCREENING BILATERAL MAMMOGRAM WITH TOMO AND CAD

[R CC synth-2D]
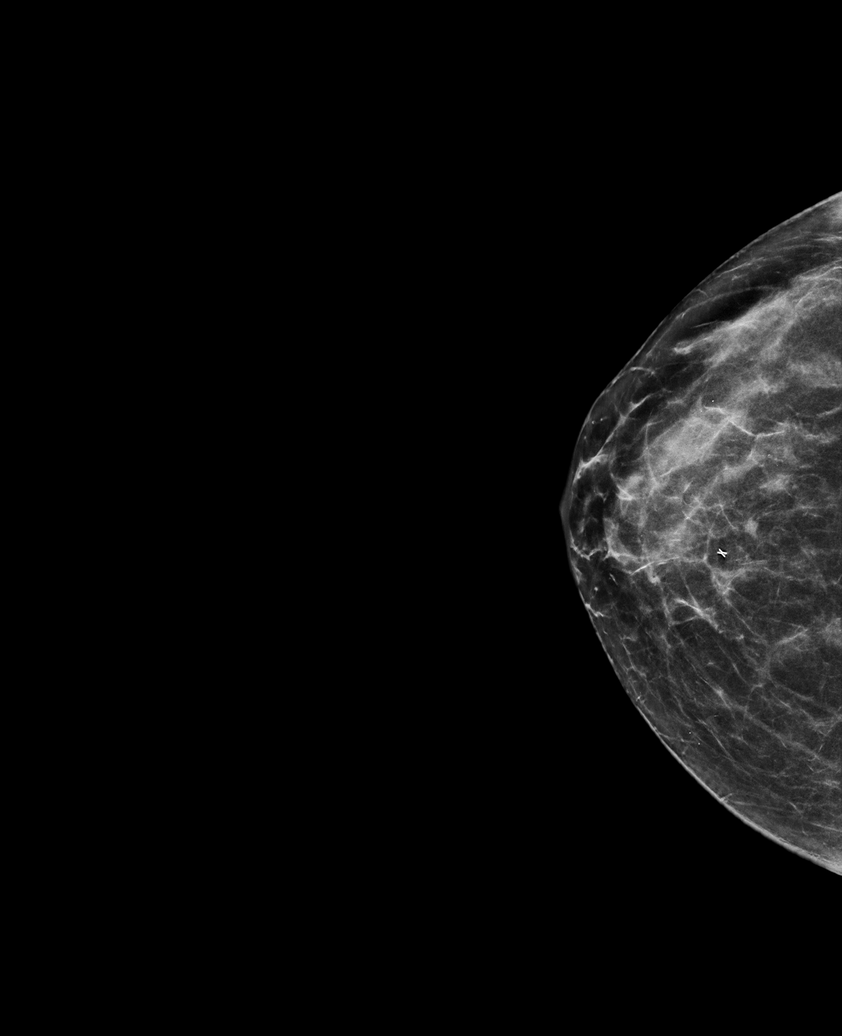

[L MLO synth-2D]
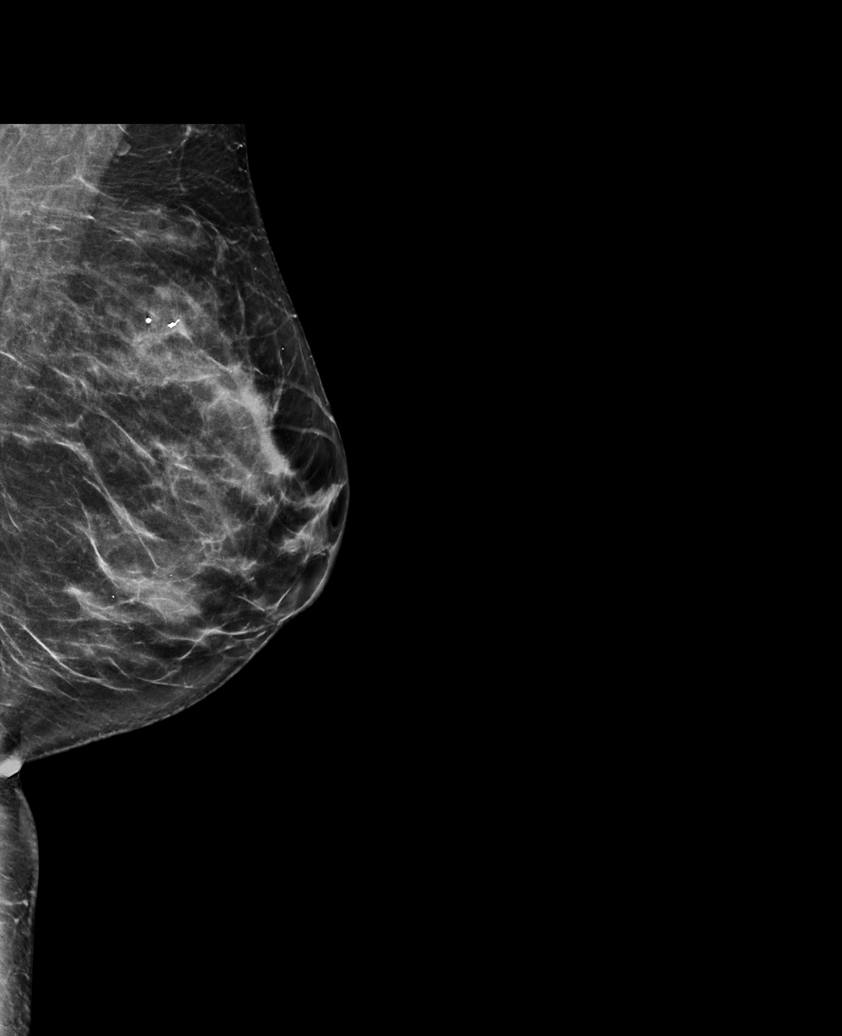

[R MLO synth-2D]
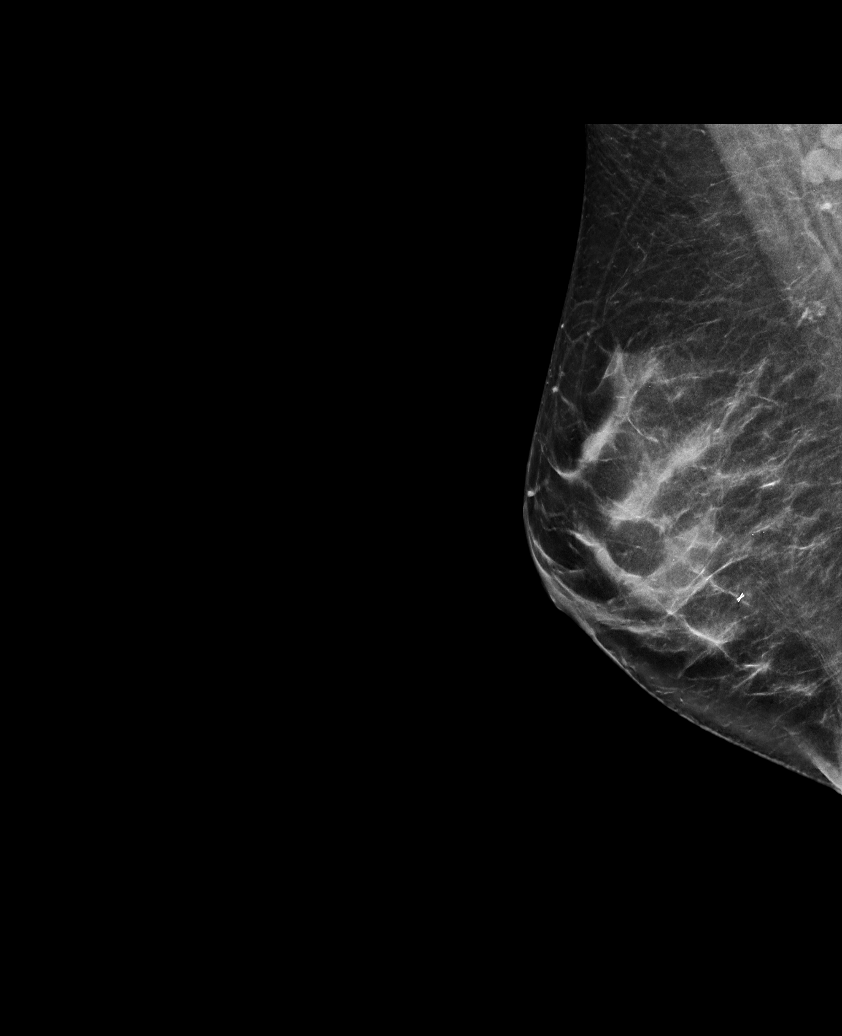

[L CC synth-2D (1 of 2)]
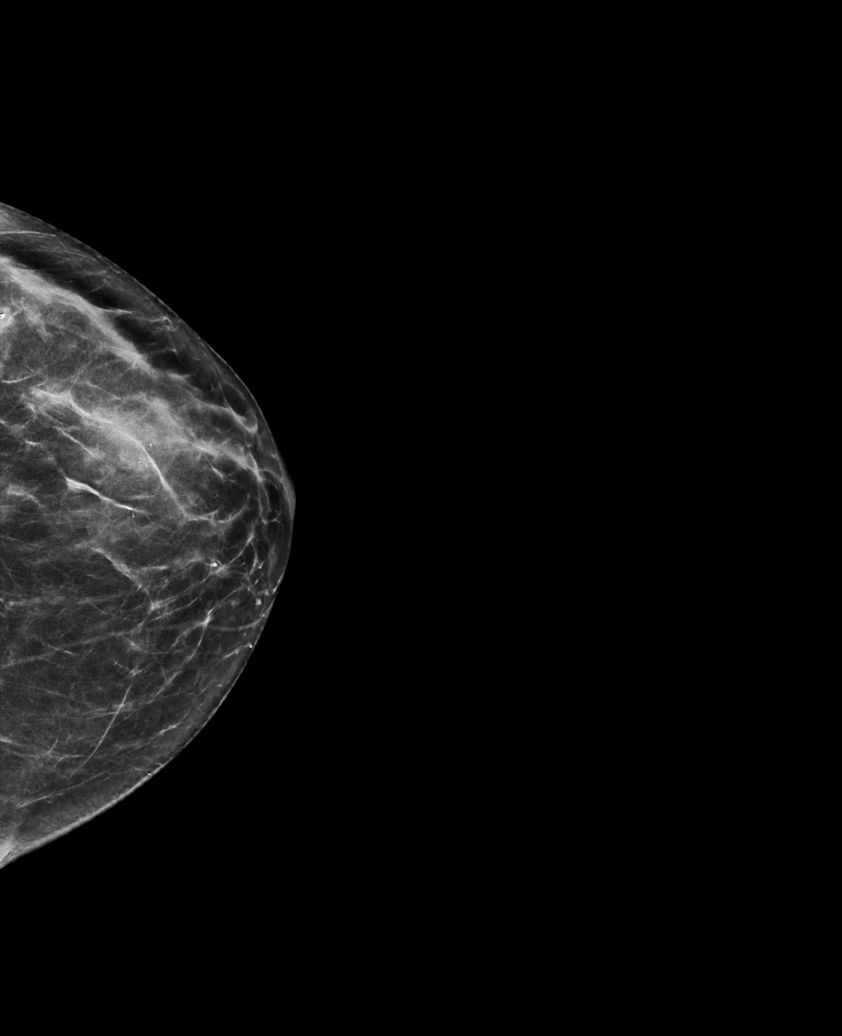

[L CC synth-2D (2 of 2)]
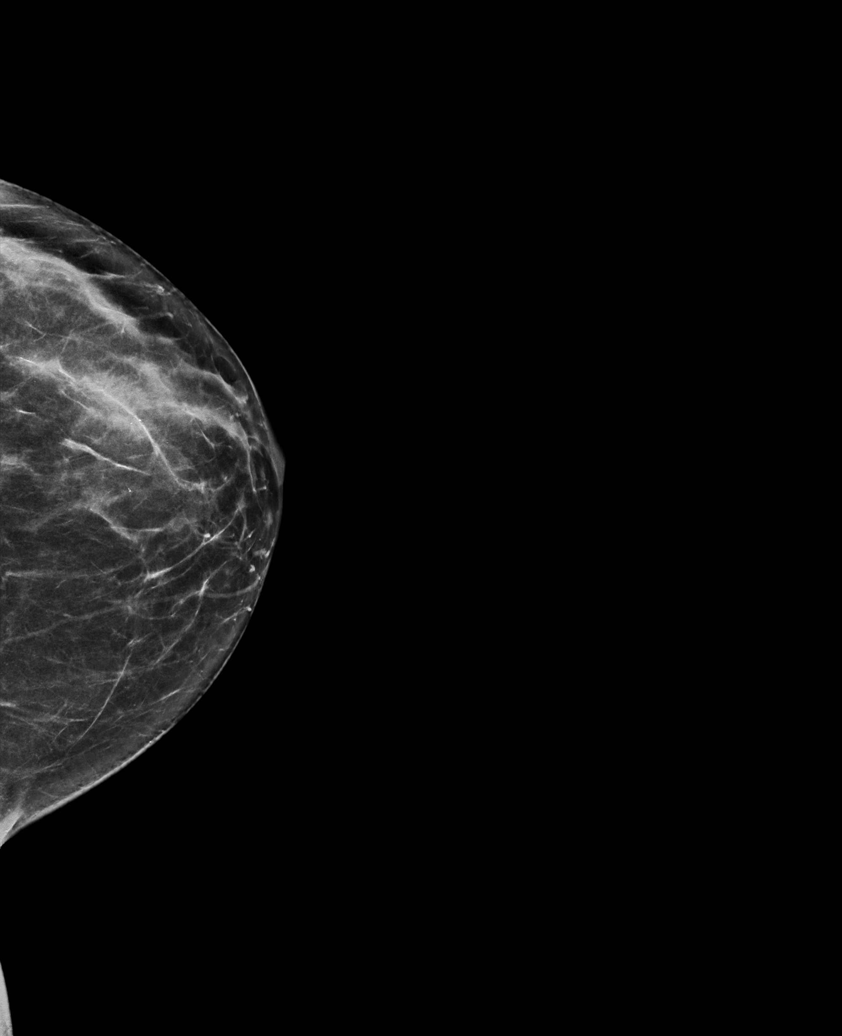

[L CC tomo · tomo slice 33/64.0]
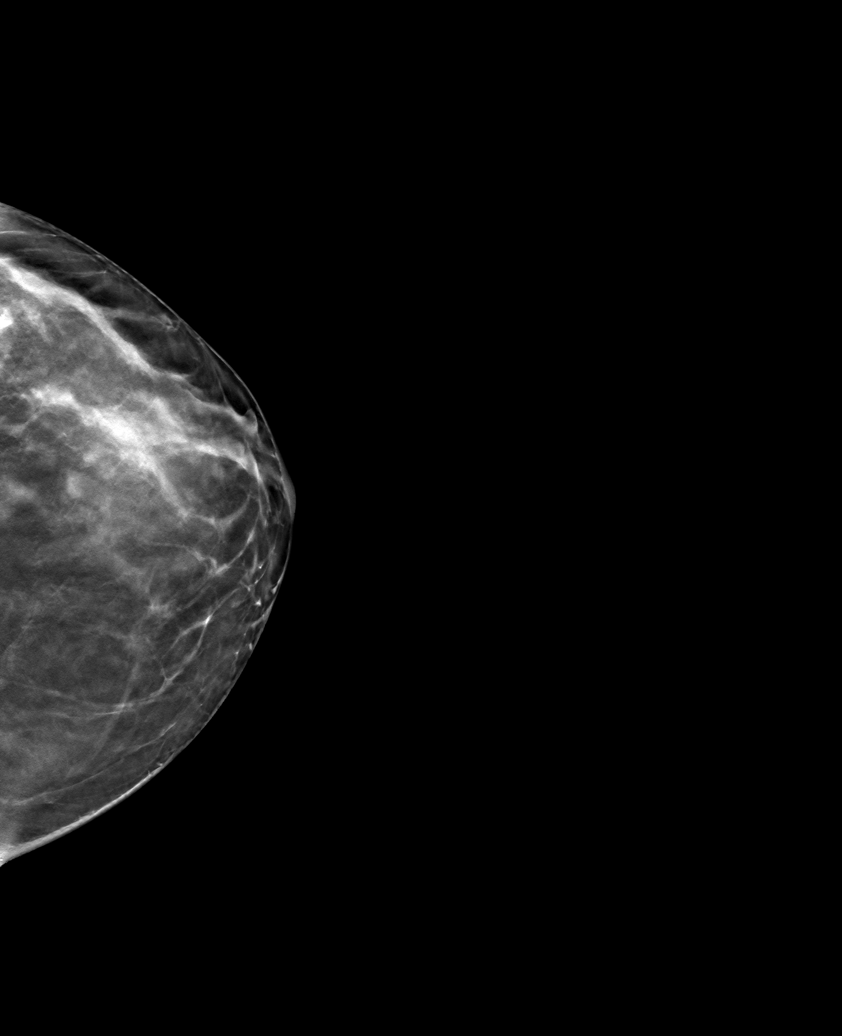

[6 of 30 positions shown; findings below may reference images not displayed]

ACR Breast Density Category c: The breast tissue is heterogeneously
dense, which may obscure small masses.
FINDINGS: In the right breast, a possible asymmetry warrants further
evaluation. In the left breast, no findings suspicious for
malignancy. Images were processed with CAD.
IMPRESSION: Further evaluation is suggested for possible asymmetry in the right
breast.

RECOMMENDATION:
Diagnostic mammogram and possibly ultrasound of the right breast.
(Code:[id number])

The patient will be contacted regarding the findings, and additional
imaging will be scheduled.

BI-RADS CATEGORY  0: Incomplete. Need additional imaging evaluation
and/or prior mammograms for comparison.

## 2020-09-06 ENCOUNTER — Ambulatory Visit: Payer: 59 | Admitting: Dermatology

## 2020-09-13 ENCOUNTER — Other Ambulatory Visit: Payer: Self-pay | Admitting: Internal Medicine

## 2020-09-13 DIAGNOSIS — Z1231 Encounter for screening mammogram for malignant neoplasm of breast: Secondary | ICD-10-CM

## 2020-09-14 ENCOUNTER — Other Ambulatory Visit: Payer: Self-pay

## 2020-09-14 ENCOUNTER — Ambulatory Visit: Payer: 59 | Admitting: Dermatology

## 2020-09-14 DIAGNOSIS — L988 Other specified disorders of the skin and subcutaneous tissue: Secondary | ICD-10-CM

## 2020-09-14 DIAGNOSIS — L905 Scar conditions and fibrosis of skin: Secondary | ICD-10-CM | POA: Diagnosis not present

## 2020-09-14 DIAGNOSIS — L821 Other seborrheic keratosis: Secondary | ICD-10-CM

## 2020-09-14 DIAGNOSIS — D229 Melanocytic nevi, unspecified: Secondary | ICD-10-CM

## 2020-09-14 DIAGNOSIS — D1800 Hemangioma unspecified site: Secondary | ICD-10-CM

## 2020-09-14 DIAGNOSIS — L738 Other specified follicular disorders: Secondary | ICD-10-CM

## 2020-09-14 DIAGNOSIS — L814 Other melanin hyperpigmentation: Secondary | ICD-10-CM | POA: Diagnosis not present

## 2020-09-14 DIAGNOSIS — L578 Other skin changes due to chronic exposure to nonionizing radiation: Secondary | ICD-10-CM

## 2020-09-14 DIAGNOSIS — Z1283 Encounter for screening for malignant neoplasm of skin: Secondary | ICD-10-CM

## 2020-09-14 NOTE — Patient Instructions (Addendum)
Melanoma ABCDEs  Melanoma is the most dangerous type of skin cancer, and is the leading cause of death from skin disease.  You are more likely to develop melanoma if you:  Have light-colored skin, light-colored eyes, or red or blond hair  Spend a lot of time in the sun  Tan regularly, either outdoors or in a tanning bed  Have had blistering sunburns, especially during childhood  Have a close family member who has had a melanoma  Have atypical moles or large birthmarks  Early detection of melanoma is key since treatment is typically straightforward and cure rates are extremely high if we catch it early.   The first sign of melanoma is often a change in a mole or a new dark spot.  The ABCDE system is a way of remembering the signs of melanoma.  A for asymmetry:  The two halves do not match. B for border:  The edges of the growth are irregular. C for color:  A mixture of colors are present instead of an even brown color. D for diameter:  Melanomas are usually (but not always) greater than 6mm - the size of a pencil eraser. E for evolution:  The spot keeps changing in size, shape, and color.  Please check your skin once per month between visits. You can use a small mirror in front and a large mirror behind you to keep an eye on the back side or your body.   If you see any new or changing lesions before your next follow-up, please call to schedule a visit.  Please continue daily skin protection including broad spectrum sunscreen SPF 30+ to sun-exposed areas, reapplying every 2 hours as needed when you're outdoors.    Recommend taking Heliocare sun protection supplement daily in sunny weather for additional sun protection. For maximum protection on the sunniest days, you can take up to 2 capsules of regular Heliocare OR take 1 capsule of Heliocare Ultra. For prolonged exposure (such as a full day in the sun), you can repeat your dose of the supplement 4 hours after your first dose.  Heliocare can be purchased at [facility] or at [email or web address].   

## 2020-09-14 NOTE — Progress Notes (Signed)
New Patient Visit  Subjective  Autumn Martinez is a 44 y.o. female who presents for the following: FBSE.  Patient here for full body skin exam and skin cancer screening, referred by Dr. Derrel Nip. No personal or family history of skin cancer, no new or changing spots in particular that patient is aware of.    The following portions of the chart were reviewed this encounter and updated as appropriate:  Tobacco  Allergies  Meds  Problems  Med Hx  Surg Hx  Fam Hx      Review of Systems:  No other skin or systemic complaints except as noted in HPI or Assessment and Plan.  Objective  Well appearing patient in no apparent distress; mood and affect are within normal limits.  A full examination was performed including scalp, head, eyes, ears, nose, lips, neck, chest, axillae, abdomen, back, buttocks, bilateral upper extremities, bilateral lower extremities, hands, feet, fingers, toes, fingernails, and toenails. All findings within normal limits unless otherwise noted below.  Objective  Forehead: Rhytides and volume loss.   Images          Objective  Face: Small yellow papules with a central dell.    Objective  Left posterior thigh: Scar with adjacent thin brown papule   Assessment & Plan  Elastosis of skin Forehead  10 units Botox to forehead.   Botox Injection - Forehead Location: See attached image  Informed consent: Discussed risks (infection, pain, bleeding, bruising, swelling, allergic reaction, paralysis of nearby muscles, eyelid droop, double vision, neck weakness, difficulty breathing, headache, undesirable cosmetic result, and need for additional treatment) and benefits of the procedure, as well as the alternatives.  Informed consent was obtained.  Preparation: The area was cleansed with alcohol.  Procedure Details:  Botox was injected into the dermis with a 30-gauge needle. Pressure applied to any bleeding. Ice packs offered for  swelling.  Lot Number:  [id number] Expiration:  01 2024  Total Units Injected:  10  Plan: Patient was instructed to remain upright for 4 hours. Patient was instructed to avoid massaging the face and avoid vigorous exercise for the rest of the day. Tylenol may be used for headache.  Allow 2 weeks before returning to clinic for additional dosing as needed. Patient will call for any problems.   Sebaceous hyperplasia Face  Benign-appearing.  Observation.  Call clinic for new or changing lesions.   Scar Left posterior thigh  Favor residual nevus Benign-appearing.  Observation.  Call clinic for new or changing lesions.  Recommend daily use of broad spectrum spf 30+ sunscreen to sun-exposed areas.     Lentigines - Scattered tan macules - Discussed due to sun exposure - Benign, observe - Call for any changes  Seborrheic Keratoses - Stuck-on, waxy, tan-brown papules and plaques  - Discussed benign etiology and prognosis. - Observe - Call for any changes  Melanocytic Nevi - Tan-brown and/or pink-flesh-colored symmetric macules and papules - Benign appearing on exam today - Observation - Call clinic for new or changing moles - Recommend daily use of broad spectrum spf 30+ sunscreen to sun-exposed areas.   Hemangiomas - Red papules - Discussed benign nature - Observe - Call for any changes  Actinic Damage - Chronic, secondary to cumulative UV/sun exposure - diffuse scaly erythematous macules with underlying dyspigmentation - Recommend daily broad spectrum sunscreen SPF 30+ to sun-exposed areas, reapply every 2 hours as needed.  - Call for new or changing lesions.  Skin cancer screening performed today.  Return in about 1 year (around 09/14/2021) for TBSE.  Graciella Belton, RMA, am acting as scribe for Forest Gleason, MD .  Documentation: I have reviewed the above documentation for accuracy and completeness, and I agree with the above.  Forest Gleason, MD

## 2020-09-15 ENCOUNTER — Encounter: Payer: Self-pay | Admitting: Dermatology

## 2020-09-21 DIAGNOSIS — Z1231 Encounter for screening mammogram for malignant neoplasm of breast: Secondary | ICD-10-CM

## 2020-09-26 ENCOUNTER — Other Ambulatory Visit: Payer: Self-pay | Admitting: Family Medicine

## 2020-09-26 DIAGNOSIS — Z1152 Encounter for screening for COVID-19: Secondary | ICD-10-CM

## 2020-09-26 NOTE — Progress Notes (Signed)
covid testing

## 2020-09-27 ENCOUNTER — Ambulatory Visit: Payer: 59 | Admitting: Internal Medicine

## 2020-09-27 ENCOUNTER — Encounter: Payer: Self-pay | Admitting: Internal Medicine

## 2020-09-27 ENCOUNTER — Other Ambulatory Visit: Payer: Self-pay

## 2020-09-27 VITALS — BP 140/92 | HR 68 | Temp 98.3°F | Resp 15 | Ht 64.0 in | Wt 171.6 lb

## 2020-09-27 DIAGNOSIS — D500 Iron deficiency anemia secondary to blood loss (chronic): Secondary | ICD-10-CM

## 2020-09-27 DIAGNOSIS — R011 Cardiac murmur, unspecified: Secondary | ICD-10-CM | POA: Diagnosis not present

## 2020-09-27 DIAGNOSIS — F339 Major depressive disorder, recurrent, unspecified: Secondary | ICD-10-CM | POA: Diagnosis not present

## 2020-09-27 DIAGNOSIS — I10 Essential (primary) hypertension: Secondary | ICD-10-CM

## 2020-09-27 NOTE — Progress Notes (Signed)
Subjective:  Patient ID: Autumn Martinez, female    DOB: 1976/07/31  Age: 44 y.o. MRN: [id number]  CC: The primary encounter diagnosis was Essential hypertension. Diagnoses of Iron deficiency anemia due to chronic blood loss, Depression, recurrent ([place]), and Cardiac murmur were also pertinent to this visit.  HPI Autumn Martinez presents for follow up on hypertension ,  MDD with anxiety   HTN:  Patient is taking her medications as prescribed and notes no adverse effects.  Home BP readings have been done about once per week and are  generally < 120/70 .  She is avoiding added salt in her diet and walking regularly about 3 times per week for exercise  .HOME READINGS  118/78    IDA:  MENSES HAVE BECOME DIFFERENT BUT REGULAR .  GETS DYSPHORIC  BEFORE HAND AND increased number of PVCS  TAKING IRON   MDD:  taking citalopram.  Admits to feeling stressed at times due to work , but sleeping well,  Does not feel depressed  Outpatient Medications Prior to Visit  Medication Sig Dispense Refill  . amLODipine (NORVASC) 2.5 MG tablet TAKE 1 TABLET BY MOUTH ONCE DAILY 90 tablet 1  . citalopram (CELEXA) 10 MG tablet Take 1 tablet (10 mg total) by mouth daily. 90 tablet 3  . clindamycin-benzoyl peroxide (BENZACLIN) gel Apply topically two times daily as needed 50 g 5  . EPINEPHrine 0.3 mg/0.3 mL IJ SOAJ injection Inject 0.3 mLs (0.3 mg total) into the muscle once. (Patient taking differently: Inject 0.3 mg into the muscle once as needed. ) 1 Device 1  . ferrous sulfate (EQL SLOW RELEASE IRON) 160 (50 Fe) MG TBCR SR tablet Take 160 mg by mouth daily.    . furosemide (LASIX) 20 MG tablet Take 1 tablet (20 mg total) by mouth daily. 90 tablet 0  . gabapentin (NEURONTIN) 300 MG capsule Take 1 capsule (300 mg total) by mouth 2 (two) times daily. 180 capsule 1  . losartan (COZAAR) 100 MG tablet Take 1 tablet (100 mg total) by mouth daily. 90 tablet 3  . Multiple Vitamins-Minerals (MULTIVITAMIN WITH  MINERALS) tablet Take 1 tablet by mouth daily.    Marland Kitchen omeprazole (PRILOSEC) 40 MG capsule TAKE 1 CAPSULE BY MOUTH ONCE DAILY 90 capsule 1  . ALPRAZolam (XANAX) 0.25 MG tablet Take 1 tablet (0.25 mg total) by mouth 2 (two) times daily as needed for anxiety. (Patient not taking: Reported on 04/20/2020) 20 tablet 0  . predniSONE (DELTASONE) 10 MG tablet 6 tablets daily for 2 days,  reduce by 1 tablet every 2 days  until gone (Patient not taking: Reported on 09/27/2020) 42 tablet 0   No facility-administered medications prior to visit.    Review of Systems;  Patient denies headache, fevers, malaise, unintentional weight loss, skin rash, eye pain, sinus congestion and sinus pain, sore throat, dysphagia,  hemoptysis , cough, dyspnea, wheezing, chest pain, palpitations, orthopnea, edema, abdominal pain, nausea, melena, diarrhea, constipation, flank pain, dysuria, hematuria, urinary  Frequency, nocturia, numbness, tingling, seizures,  Focal weakness, Loss of consciousness,  Tremor, insomnia, depression, anxiety, and suicidal ideation.      Objective:  BP (!) 140/92 (BP Location: Left Arm, Patient Position: Sitting, Cuff Size: Normal)   Pulse 68   Temp 98.3 F (36.8 C) (Oral)   Resp 15   Ht 5\' 4"  (1.626 m)   Wt 171 lb 9.6 oz (77.8 kg)   SpO2 99%   BMI 29.46 kg/m   BP Readings from Last  3 Encounters:  09/27/20 (!) 140/92  04/20/20 136/90  04/04/20 107/64    Wt Readings from Last 3 Encounters:  09/27/20 171 lb 9.6 oz (77.8 kg)  04/20/20 166 lb (75.3 kg)  04/04/20 179 lb (81.2 kg)    General appearance: alert, cooperative and appears stated age Ears: normal TM's and external ear canals both ears Throat: lips, mucosa, and tongue normal; teeth and gums normal Neck: no adenopathy, no carotid bruit, supple, symmetrical, trachea midline and thyroid not enlarged, symmetric, no tenderness/mass/nodules Back: symmetric, no curvature. ROM normal. No CVA tenderness. Lungs: clear to auscultation  bilaterally Heart: regular rate and rhythm, S1, S2 normal, no murmur, click, rub or gallop Abdomen: soft, non-tender; bowel sounds normal; no masses,  no organomegaly Pulses: 2+ and symmetric Skin: Skin color, texture, turgor normal. No rashes or lesions Lymph nodes: Cervical, supraclavicular, and axillary nodes normal.  Lab Results  Component Value Date   HGBA1C 5.9 09/23/2019    Lab Results  Component Value Date   CREATININE 0.89 09/27/2020   CREATININE 0.71 09/23/2019   CREATININE 0.77 02/16/2018    Lab Results  Component Value Date   WBC 8.3 09/27/2020   HGB 13.1 09/27/2020   HCT 39.2 09/27/2020   PLT 240.0 09/27/2020   GLUCOSE 93 09/27/2020   CHOL 197 09/23/2019   TRIG 126.0 09/23/2019   HDL 53.20 09/23/2019   LDLCALC 119 (H) 09/23/2019   ALT 15 09/27/2020   AST 20 09/27/2020   NA 138 09/27/2020   K 4.1 09/27/2020   CL 101 09/27/2020   CREATININE 0.89 09/27/2020   BUN 17 09/27/2020   CO2 31 09/27/2020   TSH 1.33 09/23/2019   INR 1.0 08/30/2016   HGBA1C 5.9 09/23/2019    No results found.  Assessment & Plan:   Problem List Items Addressed This Visit      Unprioritized   Iron deficiency anemia due to chronic blood loss   Relevant Orders   Iron, TIBC and Ferritin Panel   CBC with Differential/Platelet (Completed)   Essential hypertension - Primary    Well controlled on current regimen. Renal function stable, no changes today.  Lab Results  Component Value Date   CREATININE 0.89 09/27/2020   Lab Results  Component Value Date   NA 138 09/27/2020   K 4.1 09/27/2020   CL 101 09/27/2020   CO2 31 09/27/2020         Relevant Orders   Comprehensive metabolic panel (Completed)   Depression, recurrent ([place])    Symptoms are stable on celexa.  No changes today      Cardiac murmur    Asymptomatic,  Last ECHO 2014 showing mild mitral regurgitation           I am having [name] maintain her multivitamin with minerals, EPINEPHrine,  clindamycin-benzoyl peroxide, EQL Slow Release Iron, gabapentin, ALPRAZolam, citalopram, predniSONE, losartan, furosemide, and amLODipine.  No orders of the defined types were placed in this encounter.   There are no discontinued medications.  Follow-up: No follow-ups on file.   Crecencio Mc, MD

## 2020-09-28 ENCOUNTER — Other Ambulatory Visit: Payer: Self-pay | Admitting: Internal Medicine

## 2020-09-28 DIAGNOSIS — K219 Gastro-esophageal reflux disease without esophagitis: Secondary | ICD-10-CM

## 2020-09-28 LAB — CBC WITH DIFFERENTIAL/PLATELET
Basophils Absolute: 0.1 K/uL (ref 0.0–0.1)
Basophils Relative: 0.8 % (ref 0.0–3.0)
Eosinophils Absolute: 0.1 K/uL (ref 0.0–0.7)
Eosinophils Relative: 1.8 % (ref 0.0–5.0)
HCT: 39.2 % (ref 36.0–46.0)
Hemoglobin: 13.1 g/dL (ref 12.0–15.0)
Lymphocytes Relative: 34.9 % (ref 12.0–46.0)
Lymphs Abs: 2.9 K/uL (ref 0.7–4.0)
MCHC: 33.3 g/dL (ref 30.0–36.0)
MCV: 92.3 fl (ref 78.0–100.0)
Monocytes Absolute: 0.6 K/uL (ref 0.1–1.0)
Monocytes Relative: 7.7 % (ref 3.0–12.0)
Neutro Abs: 4.5 K/uL (ref 1.4–7.7)
Neutrophils Relative %: 54.8 % (ref 43.0–77.0)
Platelets: 240 K/uL (ref 150.0–400.0)
RBC: 4.25 Mil/uL (ref 3.87–5.11)
RDW: 12.9 % (ref 11.5–15.5)
WBC: 8.3 K/uL (ref 4.0–10.5)

## 2020-09-28 LAB — COMPREHENSIVE METABOLIC PANEL WITH GFR
ALT: 15 U/L (ref 0–35)
AST: 20 U/L (ref 0–37)
Albumin: 4.2 g/dL (ref 3.5–5.2)
Alkaline Phosphatase: 67 U/L (ref 39–117)
BUN: 17 mg/dL (ref 6–23)
CO2: 31 meq/L (ref 19–32)
Calcium: 9 mg/dL (ref 8.4–10.5)
Chloride: 101 meq/L (ref 96–112)
Creatinine, Ser: 0.89 mg/dL (ref 0.40–1.20)
GFR: 77.73 mL/min (ref >60.00)
Glucose, Bld: 93 mg/dL (ref 70–99)
Potassium: 4.1 meq/L (ref 3.5–5.1)
Sodium: 138 meq/L (ref 135–145)
Total Bilirubin: 0.3 mg/dL (ref 0.2–1.2)
Total Protein: 7.2 g/dL (ref 6.0–8.3)

## 2020-09-28 NOTE — Assessment & Plan Note (Signed)
Well controlled on current regimen. Renal function stable, no changes today.  Lab Results  Component Value Date   CREATININE 0.89 09/27/2020   Lab Results  Component Value Date   NA 138 09/27/2020   K 4.1 09/27/2020   CL 101 09/27/2020   CO2 31 09/27/2020

## 2020-09-28 NOTE — Assessment & Plan Note (Signed)
Symptoms are stable on celexa.  No changes today °

## 2020-09-28 NOTE — Assessment & Plan Note (Addendum)
Asymptomatic,  Last ECHO 2014 showing mild mitral regurgitation

## 2020-09-29 LAB — IRON,TIBC AND FERRITIN PANEL
%SAT: 25 % (calc) (ref 16–45)
Ferritin: 24 ng/mL (ref 16–232)
Iron: 74 ug/dL (ref 40–190)
TIBC: 293 mcg/dL (calc) (ref 250–450)

## 2020-10-02 ENCOUNTER — Encounter: Payer: Self-pay | Admitting: Dermatology

## 2020-10-02 NOTE — Progress Notes (Signed)
Your labs are normal, including iron and CBC .  You can stop the iron

## 2020-10-11 ENCOUNTER — Ambulatory Visit: Payer: 59 | Admitting: Dermatology

## 2020-10-12 DIAGNOSIS — H524 Presbyopia: Secondary | ICD-10-CM | POA: Diagnosis not present

## 2020-10-12 DIAGNOSIS — Z135 Encounter for screening for eye and ear disorders: Secondary | ICD-10-CM | POA: Diagnosis not present

## 2020-10-12 DIAGNOSIS — H40033 Anatomical narrow angle, bilateral: Secondary | ICD-10-CM | POA: Diagnosis not present

## 2020-10-26 DIAGNOSIS — H40033 Anatomical narrow angle, bilateral: Secondary | ICD-10-CM | POA: Diagnosis not present

## 2020-11-02 ENCOUNTER — Other Ambulatory Visit: Payer: Self-pay

## 2020-11-02 ENCOUNTER — Ambulatory Visit
Admission: RE | Admit: 2020-11-02 | Discharge: 2020-11-02 | Disposition: A | Discharge status: Home / Self Care | Payer: 59 | Source: Ambulatory Visit | Attending: Internal Medicine | Admitting: Internal Medicine

## 2020-11-02 DIAGNOSIS — Z1231 Encounter for screening mammogram for malignant neoplasm of breast: Secondary | ICD-10-CM

## 2020-11-06 ENCOUNTER — Other Ambulatory Visit: Payer: Self-pay | Admitting: Internal Medicine

## 2020-11-06 ENCOUNTER — Other Ambulatory Visit: Payer: Self-pay

## 2020-11-06 MED ORDER — FUROSEMIDE 20 MG PO TABS
20.0000 mg | ORAL_TABLET | Freq: Every day | ORAL | 0 refills | Status: DC
Start: 2020-11-06 — End: 2020-11-06

## 2020-11-07 ENCOUNTER — Other Ambulatory Visit: Payer: Self-pay | Admitting: Internal Medicine

## 2020-11-07 MED ORDER — CLINDAMYCIN PHOS-BENZOYL PEROX 1-5 % EX GEL
CUTANEOUS | 5 refills | Status: DC
Start: 2020-11-07 — End: 2020-11-07

## 2020-11-22 ENCOUNTER — Other Ambulatory Visit: Payer: Self-pay | Admitting: Internal Medicine

## 2020-11-22 DIAGNOSIS — R102 Pelvic and perineal pain: Secondary | ICD-10-CM

## 2020-11-22 MED ORDER — ALPRAZOLAM 0.25 MG PO TABS: 0.2500 mg | ORAL_TABLET | Freq: Two times a day (BID) | ORAL | 0 refills | Status: DC | PRN

## 2020-11-23 ENCOUNTER — Telehealth: Payer: Self-pay

## 2020-11-23 NOTE — Telephone Encounter (Signed)
Called and left voicemail for patient to call back to be scheduled. 

## 2020-11-24 ENCOUNTER — Other Ambulatory Visit: Payer: Self-pay | Admitting: Ophthalmology

## 2020-11-24 DIAGNOSIS — H40033 Anatomical narrow angle, bilateral: Secondary | ICD-10-CM | POA: Diagnosis not present

## 2020-11-24 NOTE — Telephone Encounter (Signed)
[facility] referring for PELVIC PAIN,  PROBABLE OVARIAN CYST . WANTS FEMALE PROVIDER. Called and left voicemail for patient to call back to be scheduled.

## 2020-11-30 DIAGNOSIS — H40033 Anatomical narrow angle, bilateral: Secondary | ICD-10-CM | POA: Diagnosis not present

## 2020-11-30 NOTE — Telephone Encounter (Signed)
Called and left voicemail for patient to call back to be scheduled. 

## 2021-01-03 ENCOUNTER — Ambulatory Visit: Payer: 59 | Admitting: Dermatology

## 2021-01-11 ENCOUNTER — Encounter: Payer: 59 | Admitting: Obstetrics and Gynecology

## 2021-01-11 ENCOUNTER — Other Ambulatory Visit: Payer: Self-pay

## 2021-01-11 ENCOUNTER — Ambulatory Visit (INDEPENDENT_AMBULATORY_CARE_PROVIDER_SITE_OTHER): Payer: Self-pay | Admitting: Dermatology

## 2021-01-11 DIAGNOSIS — L988 Other specified disorders of the skin and subcutaneous tissue: Secondary | ICD-10-CM

## 2021-01-11 NOTE — Patient Instructions (Addendum)
Topical retinoid medications like tretinoin/Retin-A, adapalene/Differin, tazarotene/Fabior, and Epiduo/Epiduo Forte can cause dryness and irritation when first started. Only apply a pea-sized amount to the entire affected area. Avoid applying it around the eyes, edges of mouth and creases at the nose. If you experience irritation, use a good moisturizer first and/or apply the medicine less often. If you are doing well with the medicine, you can increase how often you use it until you are applying every night. Be careful with sun protection while using this medication as it can make you sensitive to the sun. This medicine should not be used by pregnant women.    Recommend daily broad spectrum sunscreen SPF 30+ to sun-exposed areas, reapply every 2 hours as needed. Call for new or changing lesions.  Staying in the shade or wearing long sleeves, sun glasses (UVA+UVB protection) and wide brim hats (4-inch brim around the entire circumference of the hat) are also recommended for sun protection.    Recommend taking Heliocare sun protection supplement daily in sunny weather for additional sun protection. For maximum protection on the sunniest days, you can take up to 2 capsules of regular Heliocare OR take 1 capsule of Heliocare Ultra. For prolonged exposure (such as a full day in the sun), you can repeat your dose of the supplement 4 hours after your first dose. Heliocare can be purchased at [facility] or at [email or web address].

## 2021-01-11 NOTE — Progress Notes (Signed)
   Follow-Up Visit   Subjective  Autumn Martinez is a 45 y.o. female who presents for the following: Follow-up (Patient here today for botox and to discuss creases around mouth. She states she has gotten botox done in past but just in forehead. ).    The following portions of the chart were reviewed this encounter and updated as appropriate:  Tobacco  Allergies  Meds  Problems  Med Hx  Surg Hx  Fam Hx        Objective  Well appearing patient in no apparent distress; mood and affect are within normal limits.  A focused examination was performed including face. Relevant physical exam findings are noted in the Assessment and Plan.  Objective  face: Rhytides and volume loss.   Images    Assessment & Plan  Elastosis of skin face  Will prescribe [employer]inals Anti-Aging Tretinoin 0.025%/Niacinamide/Vitamin C/Resveratrol with Hyaluronic Acid. Apply pea sized amount nightly to the entire face. The patient was advised this is not covered by insurance since it is made by a compounding pharmacy. They will receive an email to check out and the medication will be mailed to their home.   Topical retinoid medications like tretinoin can cause dryness and irritation when first started. Only apply a pea-sized amount to the entire affected area. Avoid applying it around the eyes, edges of mouth and creases at the nose. If you experience irritation, use a good moisturizer first and/or apply the medicine less often. If you are doing well with the medicine, you can increase how often you use it until you are applying every night. Be careful with sun protection while using this medication as it can make you sensitive to the sun. This medicine should not be used by pregnant women.    Plan Restylane Defyne to NL folds if desired. If patient decides to do it she will message on MyChart and I will look for a sooner spot to do it.  Botox today. Tolerated well last time. Also helped with  headaches.   Filling material injection - face Location: See attached image  Informed consent: Discussed risks (infection, pain, bleeding, bruising, swelling, allergic reaction, paralysis of nearby muscles, eyelid droop, double vision, neck weakness, difficulty breathing, headache, undesirable cosmetic result, and need for additional treatment) and benefits of the procedure, as well as the alternatives.  Informed consent was obtained.  Preparation: The area was cleansed with alcohol.  Procedure Details:  Botox was injected into the dermis with a 30-gauge needle. Pressure applied to any bleeding. Ice packs offered for swelling.  Lot Number:  [id number] Expiration:  03/2023  Total Units Injected:  10  Plan: Patient was instructed to remain upright for 4 hours. Patient was instructed to avoid massaging the face and avoid vigorous exercise for the rest of the day. Tylenol may be used for headache.  Allow 2 weeks before returning to clinic for additional dosing as needed. Patient will call for any problems.   No follow-ups on file.   Graciella Belton, RMA, am acting as scribe for Forest Gleason, MD .  Documentation: I have reviewed the above documentation for accuracy and completeness, and I agree with the above.  Forest Gleason, MD

## 2021-01-14 ENCOUNTER — Encounter: Payer: Self-pay | Admitting: Dermatology

## 2021-02-14 ENCOUNTER — Other Ambulatory Visit: Payer: Self-pay | Admitting: Internal Medicine

## 2021-02-15 ENCOUNTER — Other Ambulatory Visit: Payer: Self-pay

## 2021-02-15 MED ORDER — AMLODIPINE BESYLATE 2.5 MG PO TABS
ORAL_TABLET | Freq: Every day | ORAL | 1 refills | Status: DC
  Filled 2021-02-15: qty 90, 90d supply, fill #0
  Filled 2021-05-21: qty 90, 90d supply, fill #1

## 2021-02-15 MED ORDER — FUROSEMIDE 20 MG PO TABS
ORAL_TABLET | Freq: Every day | ORAL | 0 refills | Status: DC
  Filled 2021-02-15: qty 90, 90d supply, fill #0

## 2021-02-17 ENCOUNTER — Other Ambulatory Visit: Payer: Self-pay

## 2021-02-19 ENCOUNTER — Encounter: Payer: Self-pay | Admitting: Dermatology

## 2021-02-20 ENCOUNTER — Other Ambulatory Visit: Payer: Self-pay | Admitting: Internal Medicine

## 2021-02-20 ENCOUNTER — Other Ambulatory Visit: Payer: Self-pay

## 2021-02-20 DIAGNOSIS — L308 Other specified dermatitis: Secondary | ICD-10-CM

## 2021-02-20 MED ORDER — CLOBETASOL PROPIONATE 0.05 % EX CREA
1.0000 "application " | TOPICAL_CREAM | Freq: Two times a day (BID) | CUTANEOUS | 0 refills | Status: DC
  Filled 2021-02-20: qty 30, 15d supply, fill #0

## 2021-02-20 MED ORDER — CLOBETASOL PROPIONATE 0.05 % EX OINT
1.0000 "application " | TOPICAL_OINTMENT | Freq: Two times a day (BID) | CUTANEOUS | 0 refills | Status: DC
  Filled 2021-02-20: qty 30, 15d supply, fill #0

## 2021-02-20 MED ORDER — CLOBETASOL PROPIONATE 0.05 % EX OINT: 1.0000 "application " | TOPICAL_OINTMENT | Freq: Two times a day (BID) | CUTANEOUS | 0 refills | Status: DC

## 2021-02-21 ENCOUNTER — Other Ambulatory Visit: Payer: Self-pay

## 2021-02-22 ENCOUNTER — Other Ambulatory Visit: Payer: Self-pay

## 2021-03-01 ENCOUNTER — Other Ambulatory Visit: Payer: Self-pay

## 2021-03-01 MED ORDER — PREDNISONE 10 MG PO TABS
ORAL_TABLET | ORAL | 0 refills | Status: DC
  Filled 2021-03-01: qty 42, 12d supply, fill #0

## 2021-03-06 ENCOUNTER — Other Ambulatory Visit: Payer: Self-pay

## 2021-03-08 ENCOUNTER — Encounter: Payer: Self-pay | Admitting: Obstetrics and Gynecology

## 2021-03-08 ENCOUNTER — Other Ambulatory Visit: Payer: Self-pay

## 2021-03-08 ENCOUNTER — Ambulatory Visit: Payer: 59 | Admitting: Obstetrics and Gynecology

## 2021-03-08 VITALS — BP 120/80 | Ht 64.0 in | Wt 180.0 lb

## 2021-03-08 DIAGNOSIS — R102 Pelvic and perineal pain: Secondary | ICD-10-CM | POA: Diagnosis not present

## 2021-03-08 DIAGNOSIS — N83202 Unspecified ovarian cyst, left side: Secondary | ICD-10-CM | POA: Diagnosis not present

## 2021-03-08 NOTE — Patient Instructions (Addendum)

## 2021-03-08 NOTE — Progress Notes (Signed)
Patient ID: Autumn Martinez, female   DOB: Jun 19, 1976, 45 y.o.   MRN: [id number]  Reason for Consult: Pelvic Pain   Referred by Crecencio Mc, MD  Subjective:     HPI:  Autumn Martinez is a 45 y.o. female, She reports that she has been having 6 months of pelvic pain.  She notes that she generally feels this pain when she is ovulating and it is always on her left side.  She reports that the pain comes and goes.  It feels sharp and aching.  It can last for 30 minutes to several hours.  She feels like she is able to point to it.  Sometimes the pain wakes her up at night.  Motrin tends to help with the pain.  She additionally has a history of heavy menstrual bleeding.  She reports that the first 2 days of her menstrual cycle are very heavy.  She changes a pad every hour.  She has a long-term history of anemia because of her heavy menstrual bleeding.  She takes oral iron pills which helps this.  She considered IV iron in the past but her insurance did not cover the IV iron so she never had it done. She had a pelvic ultrasound 6 years ago for the heavy bleeding, which was normal. She has had negative experiences with birth control in the past and prefers to avoid hormonal medications.   Gynecological History  Patient's last menstrual period was 02/24/2021. Menarche: 12 She reports passage of large clots She reports sensations of gushing or flooding of blood. She reports accidents where she bleeds through her clothing. She reports that she changes a saturated pad or tampon more frequently than every hour.  She denies that pain from her periods limits her activities.  History of fibroids, polyps, or ovarian cysts? : no  History of PCOS? no Hstory of Endometriosis? no History of abnormal pap smears? no Have you had any sexually transmitted infections in the past? no  Last Pap: Results were: 06/11/2018 NIL HPV negative     She identifies as a female. She is sexually active with  men.   She has dyspareunia. She d reports dark brown bleeding after intercourse.  She denies any abnormal vaginal discharge. She uses a tubal ligation for contraception  Obstetrical History OB History  Gravida Para Term Preterm AB Living  2 2 2     2   SAB IAB Ectopic Multiple Live Births          2    # Outcome Date GA Lbr Len/2nd Weight Sex Delivery Anes PTL Lv  2 Term 2007    M CS-LTranv   LIV  1 Term 2002    F CS-LTranv   LIV     Complications: Preeclampsia in postpartum period     Past Medical History:  Diagnosis Date  . Allergy   . Anxiety   . Depression   . GERD (gastroesophageal reflux disease)   . Heart murmur   . Hypertension   . IDA (iron deficiency anemia)    Family History  Problem Relation Age of Onset  . Hypertension Mother   . Diabetes Mother 66  . Lymphoma Mother 34  . Hypertension Father   . Brain cancer Father   . Cancer Maternal Aunt   . Breast cancer Maternal Aunt 48  . Mental retardation Maternal Uncle   . Ovarian cancer Maternal Aunt 58  . Endometrial cancer Maternal Aunt 58  . Colon cancer Neg  Hx   . Esophageal cancer Neg Hx   . Stomach cancer Neg Hx   . Rectal cancer Neg Hx    Past Surgical History:  Procedure Laterality Date  . BREAST BIOPSY Right 2018   benign  . BREAST BIOPSY Left 04/22/2018    FIBROCYSTIC CHANGE WITH CALCIFICATIONS  . CESAREAN SECTION  2002  . CESAREAN SECTION  2007  . CHOLECYSTECTOMY  2005  . TONSILLECTOMY AND ADENOIDECTOMY Bilateral 1980    Short Social History:  Social History   Tobacco Use  . Smoking status: Never Smoker  . Smokeless tobacco: Never Used  Substance Use Topics  . Alcohol use: No    Allergies  Allergen Reactions  . Butorphanol Tartrate   . Sulfonamide Derivatives   . Hctz [Hydrochlorothiazide] Rash  . Other Rash    Current Outpatient Medications  Medication Sig Dispense Refill  . ALPRAZolam (XANAX) 0.25 MG tablet TAKE 1 TABLET BY MOUTH 2 TIMES DAILY AS NEEDED FOR ANXIETY. 20  tablet 0  . amLODipine (NORVASC) 2.5 MG tablet TAKE 1 TABLET BY MOUTH ONCE DAILY 90 tablet 1  . citalopram (CELEXA) 10 MG tablet TAKE 1 TABLET (10 MG TOTAL) BY MOUTH DAILY. 90 tablet 3  . clindamycin-benzoyl peroxide (BENZACLIN) gel APPLY TO THE AFFECTED AREA(S) TWO TIMES DAILY AS NEEDED 50 g 5  . clobetasol cream (TEMOVATE) 0.05 % Apply to the affected area(s) two times daily 30 g 0  . EPINEPHrine 0.3 mg/0.3 mL IJ SOAJ injection Inject 0.3 mLs (0.3 mg total) into the muscle once. (Patient taking differently: Inject 0.3 mg into the muscle once as needed.) 1 Device 1  . ferrous sulfate (EQL SLOW RELEASE IRON) 160 (50 Fe) MG TBCR SR tablet Take 160 mg by mouth daily.    . furosemide (LASIX) 20 MG tablet TAKE 1 TABLET (20 MG TOTAL) BY MOUTH DAILY. 90 tablet 0  . gabapentin (NEURONTIN) 300 MG capsule TAKE 1 CAPSULE (300 MG TOTAL) BY MOUTH 2 (TWO) TIMES DAILY. 180 capsule 1  . losartan (COZAAR) 100 MG tablet TAKE 1 TABLET BY MOUTH DAILY. 90 tablet 3  . Multiple Vitamins-Minerals (MULTIVITAMIN WITH MINERALS) tablet Take 1 tablet by mouth daily.    Marland Kitchen omeprazole (PRILOSEC) 40 MG capsule TAKE 1 CAPSULE BY MOUTH ONCE DAILY 90 capsule 1  . prednisoLONE acetate (PRED FORTE) 1 % ophthalmic suspension INSTILL 1 DROP INTO LEFT EYE 4 TIMES DAILY 5 mL 0  . predniSONE (DELTASONE) 10 MG tablet take 6 tablets by mouth daily for 2 days,  reduce by 1 tablet every 2 days  until gone (Patient taking differently: take 6 tablets by mouth daily for 2 days,  reduce by 1 tablet every 2 days  until gone) 42 tablet 0   No current facility-administered medications for this visit.    Review of Systems  Constitutional: Negative for chills, fatigue, fever and unexpected weight change.  HENT: Negative for trouble swallowing.  Eyes: Negative for loss of vision.  Respiratory: Negative for cough, shortness of breath and wheezing.  Cardiovascular: Negative for chest pain, leg swelling, palpitations and syncope.  GI: Negative for  abdominal pain, blood in stool, diarrhea, nausea and vomiting.  GU: Negative for difficulty urinating, dysuria, frequency and hematuria.  Musculoskeletal: Negative for back pain, leg pain and joint pain.  Skin: Negative for rash.  Neurological: Negative for dizziness, headaches, light-headedness, numbness and seizures.  Psychiatric: Negative for behavioral problem, confusion, depressed mood and sleep disturbance.        Objective:  Objective  Vitals:   03/08/21 1434  BP: 120/80  Weight: 180 lb (81.6 kg)  Height: 5\' 4"  (1.626 m)   Body mass index is 30.9 kg/m.  Physical Exam Vitals and nursing note reviewed. Exam conducted with a chaperone present.  Constitutional:      Appearance: Normal appearance. She is well-developed.  HENT:     Head: Normocephalic and atraumatic.  Eyes:     Extraocular Movements: Extraocular movements intact.     Pupils: Pupils are equal, round, and reactive to light.  Cardiovascular:     Rate and Rhythm: Normal rate and regular rhythm.  Pulmonary:     Effort: Pulmonary effort is normal. No respiratory distress.     Breath sounds: Normal breath sounds.  Abdominal:     General: Abdomen is flat.     Palpations: Abdomen is soft.  Genitourinary:    Comments: External: Normal appearing vulva. No lesions noted.  Speculum examination: Normal appearing cervix. No blood in the vaginal vault. No discharge.   Bimanual examination: Uterus midline, non-tender, normal in size, shape and contour.  No CMT. Left adnexal mass, firm and fixed, tender to palpation. No left sided adnexal mass or tenderness.   Breast exam: exam not performed Musculoskeletal:        General: No signs of injury.  Skin:    General: Skin is warm and dry.  Neurological:     Mental Status: She is alert and oriented to person, place, and time.  Psychiatric:        Behavior: Behavior normal.        Thought Content: Thought content normal.        Judgment: Judgment normal.      Assessment/Plan:     45 yo with left sided pelvic pain and pelvic mass Will obtain a Pelvic US. Ordered STAT because of significant scheduling delays which are occurring secondary to staffing shortages.  Family history of ovarian cancer- provided that patient with information regarding myrisk testing.  Will plan close follow up next week.   More than 30 minutes were spent face to face with the patient in the room, reviewing the medical record, labs and images, and coordinating care for the patient. The plan of management was discussed in detail and counseling was provided.    Adrian Prows MD [facility], [facility] 03/09/2021 10:15 PM

## 2021-03-09 ENCOUNTER — Encounter: Payer: Self-pay | Admitting: Obstetrics and Gynecology

## 2021-03-12 ENCOUNTER — Other Ambulatory Visit: Payer: Self-pay | Admitting: Obstetrics and Gynecology

## 2021-03-12 ENCOUNTER — Ambulatory Visit
Admission: RE | Admit: 2021-03-12 | Discharge: 2021-03-12 | Disposition: A | Discharge status: Home / Self Care | Payer: 59 | Source: Ambulatory Visit | Attending: Obstetrics and Gynecology | Admitting: Obstetrics and Gynecology

## 2021-03-12 ENCOUNTER — Other Ambulatory Visit: Payer: Self-pay

## 2021-03-12 DIAGNOSIS — R102 Pelvic and perineal pain: Secondary | ICD-10-CM | POA: Insufficient documentation

## 2021-03-12 DIAGNOSIS — N9489 Other specified conditions associated with female genital organs and menstrual cycle: Secondary | ICD-10-CM

## 2021-03-12 DIAGNOSIS — N83202 Unspecified ovarian cyst, left side: Secondary | ICD-10-CM | POA: Diagnosis not present

## 2021-03-13 ENCOUNTER — Ambulatory Visit: Payer: 59 | Admitting: Obstetrics and Gynecology

## 2021-03-13 ENCOUNTER — Encounter: Payer: Self-pay | Admitting: Obstetrics and Gynecology

## 2021-03-13 DIAGNOSIS — R102 Pelvic and perineal pain: Secondary | ICD-10-CM | POA: Diagnosis not present

## 2021-03-13 NOTE — Progress Notes (Signed)
Patient ID: Autumn Martinez, female   DOB: 03/01/1976, 45 y.o.   MRN: [id number]  Reason for Consult: Follow-up   Referred by Crecencio Mc, MD  Subjective:     HPI:  Autumn Martinez is a 45 y.o. female. She is following up today regarding pelvic pain and irregular vaginal spotting of brown discharge. Pelvic US did not show adnexal mass, but pelvis is fixed as she is having reproducible pain on pelvic exam on the left side.    Gynecological History  Patient's last menstrual period was 02/24/2021.  Past Medical History:  Diagnosis Date  . Allergy   . Anxiety   . Depression   . GERD (gastroesophageal reflux disease)   . Heart murmur   . Hypertension   . IDA (iron deficiency anemia)    Family History  Problem Relation Age of Onset  . Hypertension Mother   . Diabetes Mother 77  . Lymphoma Mother 41  . Hypertension Father   . Brain cancer Father   . Cancer Maternal Aunt   . Breast cancer Maternal Aunt 48  . Mental retardation Maternal Uncle   . Ovarian cancer Maternal Aunt 58  . Endometrial cancer Maternal Aunt 58  . Colon cancer Neg Hx   . Esophageal cancer Neg Hx   . Stomach cancer Neg Hx   . Rectal cancer Neg Hx    Past Surgical History:  Procedure Laterality Date  . BREAST BIOPSY Right 2018   benign  . BREAST BIOPSY Left 04/22/2018    FIBROCYSTIC CHANGE WITH CALCIFICATIONS  . CESAREAN SECTION  2002  . CESAREAN SECTION  2007  . CHOLECYSTECTOMY  2005  . TONSILLECTOMY AND ADENOIDECTOMY Bilateral 1980    Short Social History:  Social History   Tobacco Use  . Smoking status: Never Smoker  . Smokeless tobacco: Never Used  Substance Use Topics  . Alcohol use: No    Allergies  Allergen Reactions  . Butorphanol Tartrate   . Sulfonamide Derivatives   . Hctz [Hydrochlorothiazide] Rash  . Other Rash    Current Outpatient Medications  Medication Sig Dispense Refill  . ALPRAZolam (XANAX) 0.25 MG tablet TAKE 1 TABLET BY MOUTH 2 TIMES DAILY AS  NEEDED FOR ANXIETY. 20 tablet 0  . amLODipine (NORVASC) 2.5 MG tablet TAKE 1 TABLET BY MOUTH ONCE DAILY 90 tablet 1  . citalopram (CELEXA) 10 MG tablet TAKE 1 TABLET (10 MG TOTAL) BY MOUTH DAILY. 90 tablet 3  . clindamycin-benzoyl peroxide (BENZACLIN) gel APPLY TO THE AFFECTED AREA(S) TWO TIMES DAILY AS NEEDED 50 g 5  . clobetasol cream (TEMOVATE) 0.05 % Apply to the affected area(s) two times daily 30 g 0  . EPINEPHrine 0.3 mg/0.3 mL IJ SOAJ injection Inject 0.3 mLs (0.3 mg total) into the muscle once. (Patient taking differently: Inject 0.3 mg into the muscle once as needed.) 1 Device 1  . ferrous sulfate (EQL SLOW RELEASE IRON) 160 (50 Fe) MG TBCR SR tablet Take 160 mg by mouth daily.    . furosemide (LASIX) 20 MG tablet TAKE 1 TABLET (20 MG TOTAL) BY MOUTH DAILY. 90 tablet 0  . gabapentin (NEURONTIN) 300 MG capsule TAKE 1 CAPSULE (300 MG TOTAL) BY MOUTH 2 (TWO) TIMES DAILY. 180 capsule 1  . losartan (COZAAR) 100 MG tablet TAKE 1 TABLET BY MOUTH DAILY. 90 tablet 3  . Multiple Vitamins-Minerals (MULTIVITAMIN WITH MINERALS) tablet Take 1 tablet by mouth daily.    Marland Kitchen omeprazole (PRILOSEC) 40 MG capsule TAKE 1 CAPSULE BY  MOUTH ONCE DAILY 90 capsule 1  . prednisoLONE acetate (PRED FORTE) 1 % ophthalmic suspension INSTILL 1 DROP INTO LEFT EYE 4 TIMES DAILY 5 mL 0  . predniSONE (DELTASONE) 10 MG tablet take 6 tablets by mouth daily for 2 days,  reduce by 1 tablet every 2 days  until gone (Patient taking differently: take 6 tablets by mouth daily for 2 days,  reduce by 1 tablet every 2 days  until gone) 42 tablet 0   No current facility-administered medications for this visit.    Review of Systems  Constitutional: Negative for chills, fatigue, fever and unexpected weight change.  HENT: Negative for trouble swallowing.  Eyes: Negative for loss of vision.  Respiratory: Negative for cough, shortness of breath and wheezing.  Cardiovascular: Negative for chest pain, leg swelling, palpitations and  syncope.  GI: Negative for abdominal pain, blood in stool, diarrhea, nausea and vomiting.  GU: Negative for difficulty urinating, dysuria, frequency and hematuria.  Musculoskeletal: Negative for back pain, leg pain and joint pain.  Skin: Negative for rash.  Neurological: Negative for dizziness, headaches, light-headedness, numbness and seizures.  Psychiatric: Negative for behavioral problem, confusion, depressed mood and sleep disturbance.        Objective:  Objective   Vitals:   03/13/21 1634  BP: 110/70  Weight: 184 lb (83.5 kg)  Height: 5\' 4"  (1.626 m)   Body mass index is 31.58 kg/m.  Physical Exam Vitals and nursing note reviewed. Exam conducted with a chaperone present.  Constitutional:      Appearance: Normal appearance.  HENT:     Head: Normocephalic and atraumatic.  Eyes:     Extraocular Movements: Extraocular movements intact.     Pupils: Pupils are equal, round, and reactive to light.  Cardiovascular:     Rate and Rhythm: Normal rate and regular rhythm.  Pulmonary:     Effort: Pulmonary effort is normal.     Breath sounds: Normal breath sounds.  Abdominal:     General: Abdomen is flat.     Palpations: Abdomen is soft.  Musculoskeletal:     Cervical back: Normal range of motion.  Skin:    General: Skin is warm and dry.  Neurological:     General: No focal deficit present.     Mental Status: She is alert and oriented to person, place, and time.  Psychiatric:        Behavior: Behavior normal.        Thought Content: Thought content normal.        Judgment: Judgment normal.     Assessment/Plan:     45 yo with pelvic pain and abnormal uterine bleeding/spotting Discussed EMB and nuswab/vaginitis testing for persistent brown spotting. She will consider having these test performed when her annual is due in August. She is not ready for biopsy today.  Will obtain pelvic MRI for pelvic pain in the setting on an normal pelvic US. Reviewed the [place] images with  Autumn Martinez. Follow up on 3 month for annual  More than 5 minutes were spent face to face with the patient in the room, reviewing the medical record, labs and images, and coordinating care for the patient. The plan of management was discussed in detail and counseling was provided.     Adrian Prows MD [facility], [facility] 03/13/2021 5:23 PM

## 2021-03-15 ENCOUNTER — Other Ambulatory Visit: Payer: Self-pay | Admitting: Internal Medicine

## 2021-03-15 DIAGNOSIS — K219 Gastro-esophageal reflux disease without esophagitis: Secondary | ICD-10-CM

## 2021-03-15 DIAGNOSIS — F419 Anxiety disorder, unspecified: Secondary | ICD-10-CM

## 2021-03-15 DIAGNOSIS — F339 Major depressive disorder, recurrent, unspecified: Secondary | ICD-10-CM

## 2021-03-16 ENCOUNTER — Other Ambulatory Visit: Payer: Self-pay

## 2021-03-16 MED ORDER — GABAPENTIN 300 MG PO CAPS
ORAL_CAPSULE | Freq: Two times a day (BID) | ORAL | 1 refills | Status: DC
  Filled 2021-03-16: qty 180, 90d supply, fill #0
  Filled 2021-09-09: qty 180, 90d supply, fill #1

## 2021-03-16 MED ORDER — CITALOPRAM HYDROBROMIDE 10 MG PO TABS
ORAL_TABLET | Freq: Every day | ORAL | 3 refills | Status: DC
  Filled 2021-03-16 – 2021-04-04 (×2): qty 90, 90d supply, fill #0
  Filled 2021-07-11: qty 90, 90d supply, fill #1
  Filled 2021-10-07: qty 90, 90d supply, fill #2
  Filled 2022-01-02: qty 90, 90d supply, fill #3

## 2021-03-16 MED ORDER — OMEPRAZOLE 40 MG PO CPDR
DELAYED_RELEASE_CAPSULE | Freq: Every day | ORAL | 1 refills | Status: DC
  Filled 2021-03-16: qty 90, 90d supply, fill #0
  Filled 2021-09-23: qty 90, 90d supply, fill #1

## 2021-03-18 MED FILL — Losartan Potassium Tab 100 MG: ORAL | 90 days supply | Qty: 90 | Fill #0 | Status: AC

## 2021-03-19 ENCOUNTER — Other Ambulatory Visit: Payer: Self-pay

## 2021-03-20 NOTE — Telephone Encounter (Signed)
I completely understand about not wanting to cancel appointments! Do you have any day's coming up in next couple of weeks that work better for you?

## 2021-03-23 NOTE — Telephone Encounter (Signed)
Please advise. I haven't cancelled appointments yet.

## 2021-03-23 NOTE — Telephone Encounter (Signed)
MRI and office visit have been cancelled

## 2021-03-23 NOTE — Telephone Encounter (Signed)
Okay to cancel

## 2021-03-26 ENCOUNTER — Ambulatory Visit: Payer: 59

## 2021-03-27 ENCOUNTER — Ambulatory Visit: Payer: 59 | Admitting: Internal Medicine

## 2021-03-27 ENCOUNTER — Encounter: Payer: Self-pay | Admitting: Internal Medicine

## 2021-03-27 ENCOUNTER — Other Ambulatory Visit: Payer: Self-pay

## 2021-03-27 VITALS — BP 126/86 | HR 76 | Temp 98.4°F | Ht 64.0 in | Wt 181.4 lb

## 2021-03-27 DIAGNOSIS — Z8616 Personal history of COVID-19: Secondary | ICD-10-CM | POA: Diagnosis not present

## 2021-03-27 DIAGNOSIS — F339 Major depressive disorder, recurrent, unspecified: Secondary | ICD-10-CM

## 2021-03-27 DIAGNOSIS — Z0000 Encounter for general adult medical examination without abnormal findings: Secondary | ICD-10-CM

## 2021-03-27 DIAGNOSIS — Z113 Encounter for screening for infections with a predominantly sexual mode of transmission: Secondary | ICD-10-CM | POA: Diagnosis not present

## 2021-03-27 DIAGNOSIS — R7303 Prediabetes: Secondary | ICD-10-CM

## 2021-03-27 DIAGNOSIS — F3281 Premenstrual dysphoric disorder: Secondary | ICD-10-CM

## 2021-03-27 DIAGNOSIS — D509 Iron deficiency anemia, unspecified: Secondary | ICD-10-CM | POA: Diagnosis not present

## 2021-03-27 DIAGNOSIS — R011 Cardiac murmur, unspecified: Secondary | ICD-10-CM | POA: Diagnosis not present

## 2021-03-27 NOTE — Progress Notes (Signed)
Patient ID: Autumn Martinez, female    DOB: Mar 07, 1976  Age: 45 y.o. MRN: [id number]  The patient is here for annual preventive  examination and management of other chronic and acute problems.  This visit occurred during the SARS-CoV-2 public health emergency.  Safety protocols were in place, including screening questions prior to the visit, additional usage of staff PPE, and extensive cleaning of exam room while observing appropriate contact time as indicated for disinfecting solutions.   Health Maintenance Due  Topic Date Due  . HIV Screening  Never done   MAY 2021 COLONOSCOPY NORMAL.   Follow up 10 yrs Mammogram dec 2021  Normal PAP normal AUGUST 2019,  scheduled in August to be done by schumann   Annual derm with  [facility] , received Botox in forehead for tension headaches.    The risk factors are reflected in the social history.  The roster of all physicians providing medical care to patient - is listed in the Snapshot section of the chart.  Activities of daily living:  The patient is 100% independent in all ADLs: dressing, toileting, feeding as well as independent mobility  Home safety : The patient has smoke detectors in the home. They wear seatbelts.  There are no firearms at home. There is no violence in the home.   There is no risks for hepatitis, STDs or HIV. There is no   history of blood transfusion. They have no travel history to infectious disease endemic areas of the world.  The patient has seen their dentist in the last six month. They have seen their eye doctor in the last year. She denies hearing difficulty with regard to whispered voices and some television programs.  They have deferred audiologic testing in the last year.  They do not  have excessive sun exposure. Discussed the need for sun protection: hats, long sleeves and use of sunscreen if there is significant sun exposure.   Diet: the importance of a healthy diet is discussed. They do have a  healthy diet.  The benefits of regular aerobic exercise were discussed. She walks 4 times per week ,  20 minutes.   Depression screen: there are no signs or vegative symptoms of depression- irritability, change in appetite, anhedonia, sadness/tearfullness.However she does not increased dysphoria premenstrually.    The following portions of the patient's history were reviewed and updated as appropriate: allergies, current medications, past family history, past medical history,  past surgical history, past social history  and problem list.  Visual acuity was not assessed per patient preference since she has regular follow up with her ophthalmologist. Hearing and body mass index were assessed and reviewed.   During the course of the visit the patient was educated and counseled about appropriate screening and preventive services including : fall prevention , diabetes screening, nutrition counseling, colorectal cancer screening, and recommended immunizations.    CC: The primary encounter diagnosis was Iron deficiency anemia, unspecified iron deficiency anemia type. Diagnoses of Prediabetes, Depression, recurrent ([place]), Cardiac murmur, Personal history of COVID-19, Screen for STD (sexually transmitted disease), Encounter for preventive health examination, and PMDD (premenstrual dysphoric disorder) were also pertinent to this visit.  History [name] has a past medical history of Allergy, Anxiety, Depression, GERD (gastroesophageal reflux disease), Heart murmur, Hypertension, and IDA (iron deficiency anemia).   She has a past surgical history that includes Cesarean section (2002); Cesarean section (2007); Cholecystectomy (2005); Tonsillectomy and adenoidectomy (Bilateral, 1980); Breast biopsy (Right, 2018); and Breast biopsy (Left, 04/22/2018).  Her family history includes Brain cancer in her father; Breast cancer (age of onset: 59) in her maternal aunt; Cancer in her maternal aunt; Diabetes (age of  onset: 9) in her mother; Endometrial cancer (age of onset: 41) in her maternal aunt; Hypertension in her father and mother; Lymphoma (age of onset: 16) in her mother; Mental retardation in her maternal uncle; Ovarian cancer (age of onset: 59) in her maternal aunt.She reports that she has never smoked. She has never used smokeless tobacco. She reports that she does not drink alcohol and does not use drugs.  Outpatient Medications Prior to Visit  Medication Sig Dispense Refill  . ALPRAZolam (XANAX) 0.25 MG tablet TAKE 1 TABLET BY MOUTH 2 TIMES DAILY AS NEEDED FOR ANXIETY. 20 tablet 0  . amLODipine (NORVASC) 2.5 MG tablet TAKE 1 TABLET BY MOUTH ONCE DAILY 90 tablet 1  . citalopram (CELEXA) 10 MG tablet TAKE 1 TABLET (10 MG TOTAL) BY MOUTH DAILY. 90 tablet 3  . clindamycin-benzoyl peroxide (BENZACLIN) gel APPLY TO THE AFFECTED AREA(S) TWO TIMES DAILY AS NEEDED 50 g 5  . clobetasol cream (TEMOVATE) 0.05 % Apply to the affected area(s) two times daily 30 g 0  . EPINEPHrine 0.3 mg/0.3 mL IJ SOAJ injection Inject 0.3 mLs (0.3 mg total) into the muscle once. (Patient taking differently: Inject 0.3 mg into the muscle once as needed.) 1 Device 1  . ferrous sulfate (EQL SLOW RELEASE IRON) 160 (50 Fe) MG TBCR SR tablet Take 160 mg by mouth daily.    . furosemide (LASIX) 20 MG tablet TAKE 1 TABLET (20 MG TOTAL) BY MOUTH DAILY. 90 tablet 0  . gabapentin (NEURONTIN) 300 MG capsule TAKE 1 CAPSULE (300 MG TOTAL) BY MOUTH 2 (TWO) TIMES DAILY. 180 capsule 1  . losartan (COZAAR) 100 MG tablet TAKE 1 TABLET BY MOUTH DAILY. 90 tablet 3  . Multiple Vitamins-Minerals (MULTIVITAMIN WITH MINERALS) tablet Take 1 tablet by mouth daily.    Marland Kitchen omeprazole (PRILOSEC) 40 MG capsule TAKE 1 CAPSULE BY MOUTH ONCE DAILY 90 capsule 1  . predniSONE (DELTASONE) 10 MG tablet take 6 tablets by mouth daily for 2 days,  reduce by 1 tablet every 2 days  until gone (Patient taking differently: take 6 tablets by mouth daily for 2 days,  reduce by  1 tablet every 2 days  until gone) 42 tablet 0  . prednisoLONE acetate (PRED FORTE) 1 % ophthalmic suspension INSTILL 1 DROP INTO LEFT EYE 4 TIMES DAILY (Patient not taking: Reported on 03/27/2021) 5 mL 0   No facility-administered medications prior to visit.    Review of Systems   Patient denies headache, fevers, malaise, unintentional weight loss, skin rash, eye pain, sinus congestion and sinus pain, sore throat, dysphagia,  hemoptysis , cough, dyspnea, wheezing, chest pain, palpitations, orthopnea, edema, abdominal pain, nausea, melena, diarrhea, constipation, flank pain, dysuria, hematuria, urinary  Frequency, nocturia, numbness, tingling, seizures,  Focal weakness, Loss of consciousness,  Tremor, insomnia, depression, anxiety, and suicidal ideation.     Objective:  BP 126/86 (BP Location: Left Arm, Patient Position: Sitting, Cuff Size: Large)   Pulse 76   Temp 98.4 F (36.9 C) (Oral)   Ht 5\' 4"  (1.626 m)   Wt 181 lb 6.4 oz (82.3 kg)   SpO2 98%   BMI 31.14 kg/m   Physical Exam  General appearance: alert, cooperative and appears stated age Ears: normal TM's and external ear canals both ears Throat: lips, mucosa, and tongue normal; teeth and gums normal Neck: no  adenopathy, no carotid bruit, supple, symmetrical, trachea midline and thyroid not enlarged, symmetric, no tenderness/mass/nodules Back: symmetric, no curvature. ROM normal. No CVA tenderness. Lungs: clear to auscultation bilaterally Heart: regular rate and rhythm, S1, S2 normal, Grade 2 systolic murmur, no click, rub or gallop Abdomen: soft, non-tender; bowel sounds normal; no masses,  no organomegaly Pulses: 2+ and symmetric Skin: Skin color, texture, turgor normal. No rashes or lesions Lymph nodes: Cervical, supraclavicular, and axillary nodes normal.  Assessment & Plan:   Problem List Items Addressed This Visit      Unprioritized   Cardiac murmur    Asymptomatic,  Last ECHO was done in  2014 showing mild mitral  regurgitation  .  Follow up with Dr Rockey Situ needed        Relevant Orders   Ambulatory referral to Cardiology   Depression, recurrent [facility])   Encounter for preventive health examination    age appropriate education and counseling updated, referrals for preventative services and immunizations addressed, dietary and smoking counseling addressed, most recent labs reviewed.  I have personally reviewed and have noted:  1) the patient's medical and social history 2) The pt's use of alcohol, tobacco, and illicit drugs 3) The patient's current medications and supplements 4) Functional ability including ADL's, fall risk, home safety risk, hearing and visual impairment 5) Diet and physical activities 6) Evidence for depression or mood disorder 7) The patient's height, weight, and BMI have been recorded in the chart  I have made referrals, and provided counseling and education based on review of the above      Personal history of COVID-19   PMDD (premenstrual dysphoric disorder)    Discussed increasing her citalopram dose to 20 mg 2 weeks prior to her menses       Prediabetes   Relevant Orders   Hemoglobin A1c   Lipid panel   Comprehensive metabolic panel    Other Visit Diagnoses    Iron deficiency anemia, unspecified iron deficiency anemia type    -  Primary   Relevant Orders   CBC with Differential/Platelet   IBC + Ferritin   Screen for STD (sexually transmitted disease)       Relevant Orders   HIV Antibody (routine testing w rflx)      I am having Cleda T. Weller maintain her multivitamin with minerals, EPINEPHrine, EQL Slow Release Iron, prednisoLONE acetate, ALPRAZolam, clindamycin-benzoyl peroxide, losartan, furosemide, amLODipine, clobetasol cream, predniSONE, omeprazole, gabapentin, and citalopram.  No orders of the defined types were placed in this encounter.   There are no discontinued medications.  Follow-up: No follow-ups on file.   Crecencio Mc, MD

## 2021-03-27 NOTE — Assessment & Plan Note (Signed)
Discussed increasing her citalopram dose to 20 mg 2 weeks prior to her menses

## 2021-03-27 NOTE — Assessment & Plan Note (Signed)

## 2021-03-27 NOTE — Assessment & Plan Note (Addendum)
Asymptomatic,  Last ECHO was done in  2014 showing mild mitral regurgitation  .  Follow up with Dr Rockey Situ needed

## 2021-03-27 NOTE — Patient Instructions (Signed)
Health Maintenance, Female Adopting a healthy lifestyle and getting preventive care are important in promoting health and wellness. Ask your health care provider about:  The right schedule for you to have regular tests and exams.  Things you can do on your own to prevent diseases and keep yourself healthy. What should I know about diet, weight, and exercise? Eat a healthy diet  Eat a diet that includes plenty of vegetables, fruits, low-fat dairy products, and lean protein.  Do not eat a lot of foods that are high in solid fats, added sugars, or sodium.   Maintain a healthy weight Body mass index (BMI) is used to identify weight problems. It estimates body fat based on height and weight. Your health care provider can help determine your BMI and help you achieve or maintain a healthy weight. Get regular exercise Get regular exercise. This is one of the most important things you can do for your health. Most adults should:  Exercise for at least 150 minutes each week. The exercise should increase your heart rate and make you sweat (moderate-intensity exercise).  Do strengthening exercises at least twice a week. This is in addition to the moderate-intensity exercise.  Spend less time sitting. Even light physical activity can be beneficial. Watch cholesterol and blood lipids Have your blood tested for lipids and cholesterol at 47 years of age, then have this test every 5 years. Have your cholesterol levels checked more often if:  Your lipid or cholesterol levels are high.  You are older than 47 years of age.  You are at high risk for heart disease. What should I know about cancer screening? Depending on your health history and family history, you may need to have cancer screening at various ages. This may include screening for:  Breast cancer.  Cervical cancer.  Colorectal cancer.  Skin cancer.  Lung cancer. What should I know about heart disease, diabetes, and high blood  pressure? Blood pressure and heart disease  High blood pressure causes heart disease and increases the risk of stroke. This is more likely to develop in people who have high blood pressure readings, are of African descent, or are overweight.  Have your blood pressure checked: ? Every 3-5 years if you are 18-39 years of age. ? Every year if you are 40 years old or older. Diabetes Have regular diabetes screenings. This checks your fasting blood sugar level. Have the screening done:  Once every three years after age 40 if you are at a normal weight and have a low risk for diabetes.  More often and at a younger age if you are overweight or have a high risk for diabetes. What should I know about preventing infection? Hepatitis B If you have a higher risk for hepatitis B, you should be screened for this virus. Talk with your health care provider to find out if you are at risk for hepatitis B infection. Hepatitis C Testing is recommended for:  Everyone born from 1945 through 1965.  Anyone with known risk factors for hepatitis C. Sexually transmitted infections (STIs)  Get screened for STIs, including gonorrhea and chlamydia, if: ? You are sexually active and are younger than 47 years of age. ? You are older than 47 years of age and your health care provider tells you that you are at risk for this type of infection. ? Your sexual activity has changed since you were last screened, and you are at increased risk for chlamydia or gonorrhea. Ask your health care provider   if you are at risk.  Ask your health care provider about whether you are at high risk for HIV. Your health care provider may recommend a prescription medicine to help prevent HIV infection. If you choose to take medicine to prevent HIV, you should first get tested for HIV. You should then be tested every 3 months for as long as you are taking the medicine. Pregnancy  If you are about to stop having your period (premenopausal) and  you may become pregnant, seek counseling before you get pregnant.  Take 400 to 800 micrograms (mcg) of folic acid every day if you become pregnant.  Ask for birth control (contraception) if you want to prevent pregnancy. Osteoporosis and menopause Osteoporosis is a disease in which the bones lose minerals and strength with aging. This can result in bone fractures. If you are 65 years old or older, or if you are at risk for osteoporosis and fractures, ask your health care provider if you should:  Be screened for bone loss.  Take a calcium or vitamin D supplement to lower your risk of fractures.  Be given hormone replacement therapy (HRT) to treat symptoms of menopause. Follow these instructions at home: Lifestyle  Do not use any products that contain nicotine or tobacco, such as cigarettes, e-cigarettes, and chewing tobacco. If you need help quitting, ask your health care provider.  Do not use street drugs.  Do not share needles.  Ask your health care provider for help if you need support or information about quitting drugs. Alcohol use  Do not drink alcohol if: ? Your health care provider tells you not to drink. ? You are pregnant, may be pregnant, or are planning to become pregnant.  If you drink alcohol: ? Limit how much you use to 0-1 drink a day. ? Limit intake if you are breastfeeding.  Be aware of how much alcohol is in your drink. In the [place] one drink equals one 12 oz bottle of beer (355 mL), one 5 oz glass of wine (148 mL), or one 1 oz glass of hard liquor (44 mL). General instructions  Schedule regular health, dental, and eye exams.  Stay current with your vaccines.  Tell your health care provider if: ? You often feel depressed. ? You have ever been abused or do not feel safe at home. Summary  Adopting a healthy lifestyle and getting preventive care are important in promoting health and wellness.  Follow your health care provider's instructions about healthy  diet, exercising, and getting tested or screened for diseases.  Follow your health care provider's instructions on monitoring your cholesterol and blood pressure. This information is not intended to replace advice given to you by your health care provider. Make sure you discuss any questions you have with your health care provider. Document Revised: 10/21/2018 Document Reviewed: 10/21/2018 Elsevier Patient Education  2021 Elsevier Inc.  

## 2021-03-28 ENCOUNTER — Ambulatory Visit: Payer: 59 | Admitting: Obstetrics and Gynecology

## 2021-03-28 LAB — CBC WITH DIFFERENTIAL/PLATELET
Basophils Absolute: 0 10*3/uL (ref 0.0–0.1)
Basophils Relative: 0.6 % (ref 0.0–3.0)
Eosinophils Absolute: 0.2 10*3/uL (ref 0.0–0.7)
Eosinophils Relative: 3.3 % (ref 0.0–5.0)
HCT: 39.1 % (ref 36.0–46.0)
Hemoglobin: 13.4 g/dL (ref 12.0–15.0)
Lymphocytes Relative: 42.4 % (ref 12.0–46.0)
Lymphs Abs: 2.4 10*3/uL (ref 0.7–4.0)
MCHC: 34.4 g/dL (ref 30.0–36.0)
MCV: 91.9 fl (ref 78.0–100.0)
Monocytes Absolute: 0.4 10*3/uL (ref 0.1–1.0)
Monocytes Relative: 7.6 % (ref 3.0–12.0)
Neutro Abs: 2.6 10*3/uL (ref 1.4–7.7)
Neutrophils Relative %: 46.1 % (ref 43.0–77.0)
Platelets: 277 10*3/uL (ref 150.0–400.0)
RBC: 4.25 Mil/uL (ref 3.87–5.11)
RDW: 13.4 % (ref 11.5–15.5)
WBC: 5.6 10*3/uL (ref 4.0–10.5)

## 2021-03-28 LAB — COMPREHENSIVE METABOLIC PANEL
ALT: 17 U/L (ref 0–35)
AST: 20 U/L (ref 0–37)
Albumin: 4.3 g/dL (ref 3.5–5.2)
Alkaline Phosphatase: 68 U/L (ref 39–117)
BUN: 12 mg/dL (ref 6–23)
CO2: 30 mEq/L (ref 19–32)
Calcium: 9.1 mg/dL (ref 8.4–10.5)
Chloride: 101 mEq/L (ref 96–112)
Creatinine, Ser: 0.73 mg/dL (ref 0.40–1.20)
GFR: 98.26 mL/min (ref >60.00)
Glucose, Bld: 90 mg/dL (ref 70–99)
Potassium: 4.1 mEq/L (ref 3.5–5.1)
Sodium: 139 mEq/L (ref 135–145)
Total Bilirubin: 0.4 mg/dL (ref 0.2–1.2)
Total Protein: 6.9 g/dL (ref 6.0–8.3)

## 2021-03-28 LAB — LIPID PANEL
Cholesterol: 227 mg/dL — ABNORMAL HIGH (ref 0–200)
HDL: 59.7 mg/dL (ref >39.00)
LDL Cholesterol: 148 mg/dL — ABNORMAL HIGH (ref 0–99)
NonHDL: 166.88
Total CHOL/HDL Ratio: 4
Triglycerides: 96 mg/dL (ref 0.0–149.0)
VLDL: 19.2 mg/dL (ref 0.0–40.0)

## 2021-03-28 LAB — IBC + FERRITIN
Ferritin: 25.3 ng/mL (ref 10.0–291.0)
Iron: 106 ug/dL (ref 42–145)
Saturation Ratios: 34.3 % (ref 20.0–50.0)
Transferrin: 221 mg/dL (ref 212.0–360.0)

## 2021-03-28 LAB — HIV ANTIBODY (ROUTINE TESTING W REFLEX): HIV 1&2 Ab, 4th Generation: NONREACTIVE

## 2021-03-28 LAB — HEMOGLOBIN A1C: Hgb A1c MFr Bld: 5.6 % (ref 4.6–6.5)

## 2021-04-05 ENCOUNTER — Other Ambulatory Visit: Payer: Self-pay

## 2021-04-05 MED FILL — Clindamycin Phosphate-Benzoyl Peroxide Gel 1-5%: CUTANEOUS | 30 days supply | Qty: 50 | Fill #0 | Status: CN

## 2021-04-05 MED FILL — Clindamycin Phosphate-Benzoyl Peroxide Gel 1-5%: CUTANEOUS | 30 days supply | Qty: 50 | Fill #0 | Status: AC

## 2021-04-12 ENCOUNTER — Ambulatory Visit: Payer: 59 | Admitting: Obstetrics and Gynecology

## 2021-05-02 ENCOUNTER — Encounter: Payer: 59 | Admitting: Internal Medicine

## 2021-05-14 ENCOUNTER — Other Ambulatory Visit: Payer: Self-pay | Admitting: Internal Medicine

## 2021-05-14 DIAGNOSIS — E663 Overweight: Secondary | ICD-10-CM

## 2021-05-15 ENCOUNTER — Other Ambulatory Visit: Payer: Self-pay

## 2021-05-15 ENCOUNTER — Other Ambulatory Visit: Payer: Self-pay | Admitting: Internal Medicine

## 2021-05-15 MED FILL — Furosemide Tab 20 MG: ORAL | 90 days supply | Qty: 90 | Fill #0 | Status: AC

## 2021-05-17 ENCOUNTER — Ambulatory Visit (INDEPENDENT_AMBULATORY_CARE_PROVIDER_SITE_OTHER): Payer: 59 | Admitting: Dermatology

## 2021-05-17 ENCOUNTER — Other Ambulatory Visit: Payer: Self-pay

## 2021-05-17 DIAGNOSIS — L988 Other specified disorders of the skin and subcutaneous tissue: Secondary | ICD-10-CM

## 2021-05-17 NOTE — Patient Instructions (Signed)

## 2021-05-17 NOTE — Progress Notes (Signed)
   Follow-Up Visit   Subjective  Autumn Martinez is a 45 y.o. female who presents for the following: Botulinum Toxin Injection (Patient here today for botox at forehead. ).    The following portions of the chart were reviewed this encounter and updated as appropriate:  Tobacco  Allergies  Meds  Problems  Med Hx  Surg Hx  Fam Hx       Objective  Well appearing patient in no apparent distress; mood and affect are within normal limits.  A focused examination was performed including face. Relevant physical exam findings are noted in the Assessment and Plan.  Head - Anterior (Face) Rhytides and volume loss.      Assessment & Plan  Elastosis of skin Head - Anterior (Face)  Botox Injection - Head - Anterior (Face) Location: forehead   Informed consent: Discussed risks (infection, pain, bleeding, bruising, swelling, allergic reaction, paralysis of nearby muscles, eyelid droop, double vision, neck weakness, difficulty breathing, headache, undesirable cosmetic result, and need for additional treatment) and benefits of the procedure, as well as the alternatives.  Informed consent was obtained.  Preparation: The area was cleansed with alcohol.  Procedure Details:  Botox was injected into the dermis with a 30-gauge needle. Pressure applied to any bleeding. Ice packs offered for swelling.  Lot Number:  [id number] Expiration:  01/2023  Total Units Injected:  10  Plan: Tylenol may be used for headache.  Allow 2 weeks before returning to clinic for additional dosing as needed. Patient will call for any problems.   Return for 3 - 4 month botox follow up.  I, Ruthell Rummage, CMA, am acting as scribe for Forest Gleason, MD.  Documentation: I have reviewed the above documentation for accuracy and completeness, and I agree with the above.  Forest Gleason, MD

## 2021-05-21 ENCOUNTER — Other Ambulatory Visit: Payer: Self-pay

## 2021-05-22 ENCOUNTER — Other Ambulatory Visit: Payer: Self-pay

## 2021-05-22 ENCOUNTER — Encounter: Payer: Self-pay | Admitting: Dermatology

## 2021-05-22 MED ORDER — OZEMPIC (0.25 OR 0.5 MG/DOSE) 2 MG/1.5ML ~~LOC~~ SOPN
0.2500 mg | PEN_INJECTOR | SUBCUTANEOUS | 2 refills | Status: DC
  Filled 2021-05-22: qty 1.5, 56d supply, fill #0
  Filled 2021-06-17: qty 1.5, 56d supply, fill #1

## 2021-05-22 NOTE — Addendum Note (Signed)
Addended by: Crecencio Mc on: 05/22/2021 10:39 AM   Modules accepted: Orders

## 2021-06-17 ENCOUNTER — Other Ambulatory Visit: Payer: Self-pay | Admitting: Internal Medicine

## 2021-06-17 ENCOUNTER — Other Ambulatory Visit: Payer: Self-pay

## 2021-06-17 MED ORDER — OZEMPIC (0.25 OR 0.5 MG/DOSE) 2 MG/1.5ML ~~LOC~~ SOPN
0.5000 mg | PEN_INJECTOR | SUBCUTANEOUS | 2 refills | Status: DC
  Filled 2021-06-17: qty 1.5, 28d supply, fill #0
  Filled 2021-07-26 (×2): qty 3, 56d supply, fill #1

## 2021-06-17 NOTE — Addendum Note (Signed)
Addended by: Crecencio Mc on: 06/17/2021 07:50 PM   Modules accepted: Orders

## 2021-06-18 ENCOUNTER — Other Ambulatory Visit: Payer: Self-pay

## 2021-06-18 MED ORDER — LOSARTAN POTASSIUM 100 MG PO TABS
ORAL_TABLET | Freq: Every day | ORAL | 3 refills | Status: DC
  Filled 2021-06-18: qty 90, 90d supply, fill #0
  Filled 2021-09-09: qty 90, 90d supply, fill #1
  Filled 2021-12-13: qty 90, 90d supply, fill #2
  Filled 2022-03-14: qty 90, 90d supply, fill #3

## 2021-06-18 MED ORDER — AMLODIPINE BESYLATE 2.5 MG PO TABS
ORAL_TABLET | Freq: Every day | ORAL | 1 refills | Status: DC
  Filled 2021-06-18 – 2021-08-22 (×2): qty 90, 90d supply, fill #0
  Filled 2021-11-20: qty 90, 90d supply, fill #1

## 2021-06-19 ENCOUNTER — Ambulatory Visit: Payer: 59 | Admitting: Obstetrics and Gynecology

## 2021-06-26 ENCOUNTER — Ambulatory Visit: Payer: 59 | Admitting: Obstetrics and Gynecology

## 2021-07-12 ENCOUNTER — Other Ambulatory Visit: Payer: Self-pay

## 2021-07-19 ENCOUNTER — Ambulatory Visit: Payer: 59 | Admitting: Obstetrics and Gynecology

## 2021-07-20 ENCOUNTER — Other Ambulatory Visit: Payer: Self-pay | Admitting: Internal Medicine

## 2021-07-20 DIAGNOSIS — Z1231 Encounter for screening mammogram for malignant neoplasm of breast: Secondary | ICD-10-CM

## 2021-07-26 ENCOUNTER — Ambulatory Visit: Payer: 59 | Admitting: Cardiology

## 2021-07-26 ENCOUNTER — Other Ambulatory Visit: Payer: Self-pay

## 2021-07-26 ENCOUNTER — Encounter: Payer: Self-pay | Admitting: Cardiology

## 2021-07-26 VITALS — BP 136/70 | HR 75 | Ht 64.0 in | Wt 176.0 lb

## 2021-07-26 DIAGNOSIS — R0600 Dyspnea, unspecified: Secondary | ICD-10-CM | POA: Diagnosis not present

## 2021-07-26 DIAGNOSIS — I10 Essential (primary) hypertension: Secondary | ICD-10-CM

## 2021-07-26 DIAGNOSIS — Z8249 Family history of ischemic heart disease and other diseases of the circulatory system: Secondary | ICD-10-CM | POA: Diagnosis not present

## 2021-07-26 DIAGNOSIS — R0609 Other forms of dyspnea: Secondary | ICD-10-CM

## 2021-07-26 MED ORDER — METOPROLOL TARTRATE 100 MG PO TABS
100.0000 mg | ORAL_TABLET | Freq: Once | ORAL | 0 refills | Status: DC
  Filled 2021-07-26: qty 1, 1d supply, fill #0

## 2021-07-26 MED FILL — Clindamycin Phosphate-Benzoyl Peroxide Gel 1-5%: CUTANEOUS | 30 days supply | Qty: 50 | Fill #1 | Status: AC

## 2021-07-26 NOTE — Patient Instructions (Signed)
Medication Instructions:  Your physician recommends that you continue on your current medications as directed. Please refer to the Current Medication list given to you today.  *If you need a refill on your cardiac medications before your next appointment, please call your pharmacy*   Lab Work: None ordered If you have labs (blood work) drawn today and your tests are completely normal, you will receive your results only by: [place] (if you have MyChart) OR A paper copy in the mail If you have any lab test that is abnormal or we need to change your treatment, we will call you to review the results.   Testing/Procedures:  Your physician has requested that you have an echocardiogram. Echocardiography is a painless test that uses sound waves to create images of your heart. It provides your doctor with information about the size and shape of your heart and how well your heart's chambers and valves are working. This procedure takes approximately one hour. There are no restrictions for this procedure.   Your physician has requested that you have cardiac CT. Cardiac computed tomography (CT) is a painless test that uses an x-ray machine to take clear, detailed pictures of your heart.     Your cardiac CT will be scheduled at  [facility] [address] [place], [place] [address] [phone]   Please arrive 15 mins early for check-in and test prep.   Please follow these instructions carefully (unless otherwise directed):    On the Night Before the Test: Be sure to Drink plenty of water. Do not consume any caffeinated/decaffeinated beverages or chocolate 12 hours prior to your test. Do not take any antihistamines 12 hours prior to your test.    On the Day of the Test: Drink plenty of water until 1 hour prior to the test. Do not eat any food 4 hours prior to the test. You may take your regular medications prior to the test.   Take metoprolol (Lopressor) two hours prior to test. (This was sent to your pharmacy) HOLD furosemide (LASIX) morning of the test. FEMALES- please wear underwire-free bra if available, avoid dresses & tight clothing        After the Test: Drink plenty of water. After receiving IV contrast, you may experience a mild flushed feeling. This is normal. On occasion, you may experience a mild rash up to 24 hours after the test. This is not dangerous. If this occurs, you can take Benadryl 25 mg and increase your fluid intake. If you experience trouble breathing, this can be serious. If it is severe call 911 IMMEDIATELY. If it is mild, please call our office. If you take any of these medications: Glipizide/Metformin, Avandament, Glucavance, please do not take 48 hours after completing test unless otherwise instructed.  Please allow 2-4 weeks for scheduling of routine cardiac CTs. Some insurance companies require a pre-authorization which may delay scheduling of this test.   For non-scheduling related questions, please contact the cardiac imaging nurse navigator should you have any questions/concerns: Marchia Bond, Cardiac Imaging Nurse Navigator Gordy Clement, Cardiac Imaging Nurse Navigator [facility] Direct Office Dial: [phone]   For scheduling needs, including cancellations and rescheduling, please call [place], [phone].   Follow-Up: At [facility], you and your health needs are our priority.  As part of our continuing mission to provide you with exceptional heart care, we have created designated Provider Care Teams.  These Care Teams include your primary Cardiologist (physician) and  Advanced Practice Providers (APPs -  Physician Assistants and Nurse Practitioners) who all work together to provide you with the care you need, when you need it.  We recommend signing up for the patient portal called "MyChart".  Sign up information is provided on this  After Visit Summary.  MyChart is used to connect with patients for Virtual Visits (Telemedicine).  Patients are able to view lab/test results, encounter notes, upcoming appointments, etc.  Non-urgent messages can be sent to your provider as well.   To learn more about what you can do with MyChart, go to [email or web address].    Your next appointment:   Follow up as needed   The format for your next appointment:   In Person  Provider:   Kate Sable, MD   Other Instructions

## 2021-07-26 NOTE — Progress Notes (Signed)
Cardiology Office Note:    Date:  07/26/2021   ID:  Autumn Martinez, DOB [birth year], MRN [id number]  PCP:  Crecencio Mc, MD   [facility] Providers Cardiologist:  Kate Sable, MD     Referring MD: Crecencio Mc, MD   Chief Complaint  Patient presents with   New Patient (Initial Visit)    Ref by Dr. Derrel Nip for heart murmur and family history of CAD, was seen by  Dr. Rockey Situ in 2019. Medications reviewed by the patient verbally. Patient c/o PVC's with some shortness of breath.    Autumn Martinez is a 45 y.o. female who is being seen today for the evaluation of heart murmur at the request of Crecencio Mc, MD.   History of Present Illness:    Autumn Martinez is a 46 y.o. female with a hx of hypertension, heart murmur who presents due to murmur and shortness of breath.  Patient states having cardiac murmur for years now.  Was lastly seen in the clinic 3 years ago.  Echocardiogram obtained in 2014 did not reveal any significant abnormalities.  She exercises about 4 times weekly, has noticed more than ordinary shortness of breath with exertion.  Denies chest pain.  Her father had CAD with stenting in his 55s.  Takes all medications as prescribed.  Past Medical History:  Diagnosis Date   Allergy    Anxiety    Depression    GERD (gastroesophageal reflux disease)    Heart murmur    Hypertension    IDA (iron deficiency anemia)     Past Surgical History:  Procedure Laterality Date   BREAST BIOPSY Right 2018   benign   BREAST BIOPSY Left 04/22/2018    FIBROCYSTIC CHANGE WITH CALCIFICATIONS   CESAREAN SECTION  2002   CESAREAN SECTION  2007   CHOLECYSTECTOMY  2005   TONSILLECTOMY AND ADENOIDECTOMY Bilateral 1980    Current Medications: Current Meds  Medication Sig   amLODipine (NORVASC) 2.5 MG tablet TAKE 1 TABLET BY MOUTH ONCE DAILY   citalopram (CELEXA) 10 MG tablet TAKE 1 TABLET (10 MG TOTAL) BY MOUTH DAILY.   clindamycin-benzoyl peroxide  (BENZACLIN) gel APPLY TO THE AFFECTED AREA(S) TWO TIMES DAILY AS NEEDED   clobetasol cream (TEMOVATE) 0.05 % Apply to the affected area(s) two times daily   ferrous sulfate (EQL SLOW RELEASE IRON) 160 (50 Fe) MG TBCR SR tablet Take 160 mg by mouth daily.   furosemide (LASIX) 20 MG tablet TAKE 1 TABLET (20 MG TOTAL) BY MOUTH DAILY.   gabapentin (NEURONTIN) 300 MG capsule TAKE 1 CAPSULE (300 MG TOTAL) BY MOUTH 2 (TWO) TIMES DAILY.   losartan (COZAAR) 100 MG tablet TAKE 1 TABLET BY MOUTH DAILY.   metoprolol tartrate (LOPRESSOR) 100 MG tablet Take 1 tablet (100 mg total) by mouth once for 1 dose. Take 2 hours prior to your CT scan.   Multiple Vitamins-Minerals (MULTIVITAMIN WITH MINERALS) tablet Take 1 tablet by mouth daily.   omeprazole (PRILOSEC) 40 MG capsule TAKE 1 CAPSULE BY MOUTH ONCE DAILY   Semaglutide,0.25 or 0.'5MG'$ /DOS, (OZEMPIC, 0.25 OR 0.5 MG/DOSE,) 2 MG/1.5ML SOPN Inject 0.5 mg into the skin once a week for 4 doses.     Allergies:   Butorphanol tartrate, Sulfonamide derivatives, Hctz [hydrochlorothiazide], and Other   Social History   Socioeconomic History   Marital status: Married    Spouse name: Not on file   Number of children: Not on file   Years of education: Not on file  Highest education level: Not on file  Occupational History   Not on file  Tobacco Use   Smoking status: Never   Smokeless tobacco: Never  Vaping Use   Vaping Use: Never used  Substance and Sexual Activity   Alcohol use: No   Drug use: No   Sexual activity: Yes  Other Topics Concern   Not on file  Social History Narrative   Not on file   Social Determinants of Health   Financial Resource Strain: Not on file  Food Insecurity: Not on file  Transportation Needs: Not on file  Physical Activity: Not on file  Stress: Not on file  Social Connections: Not on file     Family History: The patient's family history includes Brain cancer in her father; Breast cancer (age of onset: 77) in her maternal  aunt; Cancer in her maternal aunt; Diabetes (age of onset: 36) in her mother; Endometrial cancer (age of onset: 55) in her maternal aunt; Hypertension in her father and mother; Lymphoma (age of onset: 66) in her mother; Mental retardation in her maternal uncle; Ovarian cancer (age of onset: 25) in her maternal aunt. There is no history of Colon cancer, Esophageal cancer, Stomach cancer, or Rectal cancer.  ROS:   Please see the history of present illness.     All other systems reviewed and are negative.  EKGs/Labs/Other Studies Reviewed:    The following studies were reviewed today:   EKG:  EKG is  ordered today.  The ekg ordered today demonstrates normal sinus rhythm possible LAE  Recent Labs: 03/27/2021: ALT 17; BUN 12; Creatinine, Ser 0.73; Hemoglobin 13.4; Platelets 277.0; Potassium 4.1; Sodium 139  Recent Lipid Panel    Component Value Date/Time   CHOL 227 (H) 03/27/2021 1433   TRIG 96.0 03/27/2021 1433   HDL 59.70 03/27/2021 1433   CHOLHDL 4 03/27/2021 1433   VLDL 19.2 03/27/2021 1433   LDLCALC 148 (H) 03/27/2021 1433     Risk Assessment/Calculations:          Physical Exam:    VS:  BP 136/70 (BP Location: Right Arm, Patient Position: Sitting, Cuff Size: Normal)   Pulse 75   Ht '5\' 4"'$  (1.626 m)   Wt 176 lb (79.8 kg)   SpO2 98%   BMI 30.21 kg/m     Wt Readings from Last 3 Encounters:  07/26/21 176 lb (79.8 kg)  03/27/21 181 lb 6.4 oz (82.3 kg)  03/13/21 184 lb (83.5 kg)     GEN:  Well nourished, well developed in no acute distress HEENT: Normal NECK: No JVD; No carotid bruits LYMPHATICS: No lymphadenopathy CARDIAC: RRR, faint systolic murmur RESPIRATORY:  Clear to auscultation without rales, wheezing or rhonchi  ABDOMEN: Soft, non-tender, non-distended MUSCULOSKELETAL:  No edema; No deformity  SKIN: Warm and dry NEUROLOGIC:  Alert and oriented x 3 PSYCHIATRIC:  Normal affect   ASSESSMENT:    1. Dyspnea on exertion   2. Primary hypertension   3. Family  history of early CAD    PLAN:    In order of problems listed above:  Shortness of breath with overexertion.  Risk factors include hypertension, family history of CAD.  Obtain echocardiogram, obtain coronary CTA to evaluate presence of CAD. Hypertension, BP controlled, continue current meds. Family history of early CAD, coronary CTA as above.  ASCVD 1.6%.  Continue low-cholesterol diet for now, pending coronary CTA results.  Follow-up based on cardiac testing results.     Medication Adjustments/Labs and Tests Ordered: Current medicines  are reviewed at length with the patient today.  Concerns regarding medicines are outlined above.  Orders Placed This Encounter  Procedures   CT CORONARY MORPH W/CTA COR W/SCORE W/CA W/CM &/OR WO/CM   Basic metabolic panel   EKG [id number]   ECHOCARDIOGRAM COMPLETE    Meds ordered this encounter  Medications   metoprolol tartrate (LOPRESSOR) 100 MG tablet    Sig: Take 1 tablet (100 mg total) by mouth once for 1 dose. Take 2 hours prior to your CT scan.    Dispense:  1 tablet    Refill:  0     Patient Instructions  Medication Instructions:  Your physician recommends that you continue on your current medications as directed. Please refer to the Current Medication list given to you today.  *If you need a refill on your cardiac medications before your next appointment, please call your pharmacy*   Lab Work: None ordered If you have labs (blood work) drawn today and your tests are completely normal, you will receive your results only by: [place] (if you have MyChart) OR A paper copy in the mail If you have any lab test that is abnormal or we need to change your treatment, we will call you to review the results.   Testing/Procedures:  Your physician has requested that you have an echocardiogram. Echocardiography is a painless test that uses sound waves to create images of your heart. It provides your doctor with information about the size  and shape of your heart and how well your heart's chambers and valves are working. This procedure takes approximately one hour. There are no restrictions for this procedure.   Your physician has requested that you have cardiac CT. Cardiac computed tomography (CT) is a painless test that uses an x-ray machine to take clear, detailed pictures of your heart.     Your cardiac CT will be scheduled at  [facility] [address] [place],  [address] [phone]   Please arrive 15 mins early for check-in and test prep.   Please follow these instructions carefully (unless otherwise directed):    On the Night Before the Test: Be sure to Drink plenty of water. Do not consume any caffeinated/decaffeinated beverages or chocolate 12 hours prior to your test. Do not take any antihistamines 12 hours prior to your test.    On the Day of the Test: Drink plenty of water until 1 hour prior to the test. Do not eat any food 4 hours prior to the test. You may take your regular medications prior to the test.  Take metoprolol (Lopressor) two hours prior to test. (This was sent to your pharmacy) HOLD furosemide (LASIX) morning of the test. FEMALES- please wear underwire-free bra if available, avoid dresses & tight clothing        After the Test: Drink plenty of water. After receiving IV contrast, you may experience a mild flushed feeling. This is normal. On occasion, you may experience a mild rash up to 24 hours after the test. This is not dangerous. If this occurs, you can take Benadryl 25 mg and increase your fluid intake. If you experience trouble breathing, this can be serious. If it is severe call 911 IMMEDIATELY. If it is mild, please call our office. If you take any of these medications: Glipizide/Metformin, Avandament, Glucavance, please do not take 48 hours after completing test unless otherwise instructed.  Please allow 2-4 weeks for  scheduling of routine cardiac CTs. Some insurance  companies require a pre-authorization which may delay scheduling of this test.   For non-scheduling related questions, please contact the cardiac imaging nurse navigator should you have any questions/concerns: Marchia Bond, Cardiac Imaging Nurse Navigator Gordy Clement, Cardiac Imaging Nurse Navigator [facility] Direct Office Dial: [phone]   For scheduling needs, including cancellations and rescheduling, please call [place], [phone].   Follow-Up: At [facility], you and your health needs are our priority.  As part of our continuing mission to provide you with exceptional heart care, we have created designated Provider Care Teams.  These Care Teams include your primary Cardiologist (physician) and Advanced Practice Providers (APPs -  Physician Assistants and Nurse Practitioners) who all work together to provide you with the care you need, when you need it.  We recommend signing up for the patient portal called "MyChart".  Sign up information is provided on this After Visit Summary.  MyChart is used to connect with patients for Virtual Visits (Telemedicine).  Patients are able to view lab/test results, encounter notes, upcoming appointments, etc.  Non-urgent messages can be sent to your provider as well.   To learn more about what you can do with MyChart, go to [email or web address].    Your next appointment:   Follow up as needed   The format for your next appointment:   In Person  Provider:   Kate Sable, MD   Other Instructions    Signed, Kate Sable, MD  07/26/2021 1:54 PM    [place]

## 2021-07-27 LAB — BASIC METABOLIC PANEL
BUN/Creatinine Ratio: 17 (ref 9–23)
BUN: 12 mg/dL (ref 6–24)
CO2: 21 mmol/L (ref 20–29)
Calcium: 9.2 mg/dL (ref 8.7–10.2)
Chloride: 101 mmol/L (ref 96–106)
Creatinine, Ser: 0.72 mg/dL (ref 0.57–1.00)
Glucose: 94 mg/dL (ref 65–99)
Potassium: 4.4 mmol/L (ref 3.5–5.2)
Sodium: 139 mmol/L (ref 134–144)
eGFR: 104 mL/min/{1.73_m2} (ref >59)

## 2021-08-13 ENCOUNTER — Other Ambulatory Visit: Payer: Self-pay

## 2021-08-13 MED ORDER — OZEMPIC (1 MG/DOSE) 4 MG/3ML ~~LOC~~ SOPN
1.0000 mg | PEN_INJECTOR | SUBCUTANEOUS | 2 refills | Status: DC
  Filled 2021-08-13: qty 9, 84d supply, fill #0

## 2021-08-15 ENCOUNTER — Other Ambulatory Visit: Payer: Self-pay | Admitting: Internal Medicine

## 2021-08-15 ENCOUNTER — Other Ambulatory Visit: Payer: Self-pay

## 2021-08-15 MED ORDER — FUROSEMIDE 20 MG PO TABS
ORAL_TABLET | Freq: Every day | ORAL | 0 refills | Status: DC
  Filled 2021-08-15: qty 90, 90d supply, fill #0

## 2021-08-16 ENCOUNTER — Other Ambulatory Visit: Payer: Self-pay

## 2021-08-22 ENCOUNTER — Other Ambulatory Visit: Payer: Self-pay

## 2021-08-23 DIAGNOSIS — Z1231 Encounter for screening mammogram for malignant neoplasm of breast: Secondary | ICD-10-CM

## 2021-08-29 ENCOUNTER — Other Ambulatory Visit: Payer: Self-pay

## 2021-08-29 ENCOUNTER — Telehealth: Payer: 59 | Admitting: Nurse Practitioner

## 2021-08-29 DIAGNOSIS — J40 Bronchitis, not specified as acute or chronic: Secondary | ICD-10-CM | POA: Diagnosis not present

## 2021-08-29 MED ORDER — AZITHROMYCIN 250 MG PO TABS
ORAL_TABLET | ORAL | 0 refills | Status: DC
  Filled 2021-08-29: qty 6, 5d supply, fill #0

## 2021-08-29 MED ORDER — BENZONATATE 100 MG PO CAPS
100.0000 mg | ORAL_CAPSULE | Freq: Three times a day (TID) | ORAL | 0 refills | Status: DC | PRN
  Filled 2021-08-29: qty 20, 7d supply, fill #0

## 2021-08-29 MED ORDER — PREDNISONE 10 MG PO TABS
ORAL_TABLET | ORAL | 0 refills | Status: DC
  Filled 2021-08-29: qty 21, 6d supply, fill #0

## 2021-08-29 NOTE — Progress Notes (Signed)
We are sorry that you are not feeling well.  Here is how we plan to help!  Based on your presentation I believe you most likely have A cough due to bacteria.  When patients have a fever and a productive cough with a change in color or increased sputum production, we are concerned about bacterial bronchitis.  If left untreated it can progress to pneumonia.  If your symptoms do not improve with your treatment plan it is important that you contact your provider.   I have prescribed Azithromyin 250 mg: two tablets now and then one tablet daily for 4 additonal days    In addition you may use A prescription cough medication called Tessalon Perles 100mg . You may take 1-2 capsules every 8 hours as needed for your cough.   Prednisone 10 mg daily for 6 days (see taper instructions below)  Directions for 6 day taper: Day 1: 2 tablets before breakfast, 1 after both lunch & dinner and 2 at bedtime Day 2: 1 tab before breakfast, 1 after both lunch & dinner and 2 at bedtime Day 3: 1 tab at each meal & 1 at bedtime Day 4: 1 tab at breakfast, 1 at lunch, 1 at bedtime Day 5: 1 tab at breakfast & 1 tab at bedtime Day 6: 1 tab at breakfast  From your responses in the eVisit questionnaire you describe inflammation in the upper respiratory tract which is causing a significant cough.  This is commonly called Bronchitis and has four common causes:   Allergies Viral Infections Acid Reflux Bacterial Infection Allergies, viruses and acid reflux are treated by controlling symptoms or eliminating the cause. An example might be a cough caused by taking certain blood pressure medications. You stop the cough by changing the medication. Another example might be a cough caused by acid reflux. Controlling the reflux helps control the cough.  USE OF BRONCHODILATOR ("RESCUE") INHALERS: There is a risk from using your bronchodilator too frequently.  The risk is that over-reliance on a medication which only relaxes the muscles  surrounding the breathing tubes can reduce the effectiveness of medications prescribed to reduce swelling and congestion of the tubes themselves.  Although you feel brief relief from the bronchodilator inhaler, your asthma may actually be worsening with the tubes becoming more swollen and filled with mucus.  This can delay other crucial treatments, such as oral steroid medications. If you need to use a bronchodilator inhaler daily, several times per day, you should discuss this with your provider.  There are probably better treatments that could be used to keep your asthma under control.     HOME CARE Only take medications as instructed by your medical team. Complete the entire course of an antibiotic. Drink plenty of fluids and get plenty of rest. Avoid close contacts especially the very young and the elderly Cover your mouth if you cough or cough into your sleeve. Always remember to wash your hands A steam or ultrasonic humidifier can help congestion.   GET HELP RIGHT AWAY IF: You develop worsening fever. You become short of breath You cough up blood. Your symptoms persist after you have completed your treatment plan MAKE SURE YOU  Understand these instructions. Will watch your condition. Will get help right away if you are not doing well or get worse.    Thank you for choosing an e-visit.  Your e-visit answers were reviewed by a board certified advanced clinical practitioner to complete your personal care plan. Depending upon the condition, your plan  could have included both over the counter or prescription medications.  Please review your pharmacy choice. Make sure the pharmacy is open so you can pick up prescription now. If there is a problem, you may contact your provider through [employer] and have the prescription routed to another pharmacy.  Your safety is important to [place]. If you have drug allergies check your prescription carefully.   For the next 24 hours you can use  MyChart to ask questions about today's visit, request a non-urgent call back, or ask for a work or school excuse. You will get an email in the next two days asking about your experience. I hope that your e-visit has been valuable and will speed your recovery.   5-10 minutes spent reviewing and documenting in chart.

## 2021-09-10 ENCOUNTER — Other Ambulatory Visit: Payer: Self-pay

## 2021-09-13 ENCOUNTER — Ambulatory Visit: Payer: 59 | Admitting: Obstetrics and Gynecology

## 2021-09-14 ENCOUNTER — Ambulatory Visit: Payer: 59 | Admitting: Obstetrics and Gynecology

## 2021-09-20 ENCOUNTER — Other Ambulatory Visit (HOSPITAL_COMMUNITY)
Admission: RE | Admit: 2021-09-20 | Discharge: 2021-09-20 | Disposition: A | Discharge status: Home / Self Care | Payer: 59 | Source: Ambulatory Visit | Attending: Obstetrics and Gynecology | Admitting: Obstetrics and Gynecology

## 2021-09-20 ENCOUNTER — Ambulatory Visit (INDEPENDENT_AMBULATORY_CARE_PROVIDER_SITE_OTHER): Payer: 59 | Admitting: Obstetrics and Gynecology

## 2021-09-20 ENCOUNTER — Ambulatory Visit: Payer: 59 | Admitting: Dermatology

## 2021-09-20 ENCOUNTER — Encounter: Payer: Self-pay | Admitting: Obstetrics and Gynecology

## 2021-09-20 ENCOUNTER — Other Ambulatory Visit: Payer: Self-pay

## 2021-09-20 VITALS — BP 120/72 | Ht 66.0 in | Wt 172.6 lb

## 2021-09-20 DIAGNOSIS — D2271 Melanocytic nevi of right lower limb, including hip: Secondary | ICD-10-CM | POA: Diagnosis not present

## 2021-09-20 DIAGNOSIS — Z124 Encounter for screening for malignant neoplasm of cervix: Secondary | ICD-10-CM

## 2021-09-20 DIAGNOSIS — L988 Other specified disorders of the skin and subcutaneous tissue: Secondary | ICD-10-CM

## 2021-09-20 DIAGNOSIS — L578 Other skin changes due to chronic exposure to nonionizing radiation: Secondary | ICD-10-CM | POA: Diagnosis not present

## 2021-09-20 DIAGNOSIS — D229 Melanocytic nevi, unspecified: Secondary | ICD-10-CM

## 2021-09-20 DIAGNOSIS — Z01419 Encounter for gynecological examination (general) (routine) without abnormal findings: Secondary | ICD-10-CM | POA: Diagnosis not present

## 2021-09-20 DIAGNOSIS — D1800 Hemangioma unspecified site: Secondary | ICD-10-CM

## 2021-09-20 DIAGNOSIS — Z1283 Encounter for screening for malignant neoplasm of skin: Secondary | ICD-10-CM | POA: Diagnosis not present

## 2021-09-20 DIAGNOSIS — Z0000 Encounter for general adult medical examination without abnormal findings: Secondary | ICD-10-CM

## 2021-09-20 DIAGNOSIS — L821 Other seborrheic keratosis: Secondary | ICD-10-CM

## 2021-09-20 DIAGNOSIS — L814 Other melanin hyperpigmentation: Secondary | ICD-10-CM

## 2021-09-20 NOTE — Progress Notes (Signed)
Gynecology Annual Exam  PCP: Crecencio Mc, MD  Chief Complaint:  Chief Complaint  Patient presents with   Gynecologic Exam    History of Present Illness: Patient is a 45 y.o. [id number] presents for annual exam. The patient has no complaints today.   LMP: Patient's last menstrual period was 09/20/2021. Average Interval: regular, 30 days Duration of flow: 5 days  Heavy Menses: yes Dysmenorrhea: no  She sometimes has passage of large clots She reports sensations of gushing or flooding of blood. She sometimes has accidents where she bleeds through her clothing. She sometimes that she changes a saturated pad or tampon more frequently than every hour.  She denies that pain from her periods limits her activities.  The patient does perform self breast exams.  There is notable family history of breast or ovarian cancer in her family.  The patient has regular exercise: running 4 days a week  The patient reports current symptoms of depression.   PHQ-9: 9 GAD-7: 4   Review of Systems: Review of Systems  Constitutional:  Positive for malaise/fatigue. Negative for chills, fever and weight loss.  HENT:  Negative for congestion, hearing loss and sinus pain.   Eyes:  Negative for blurred vision and double vision.  Respiratory:  Negative for cough, sputum production, shortness of breath and wheezing.   Cardiovascular:  Negative for chest pain, palpitations, orthopnea and leg swelling.  Gastrointestinal:  Negative for abdominal pain, constipation, diarrhea, nausea and vomiting.  Genitourinary:  Negative for dysuria, flank pain, frequency, hematuria and urgency.       +dyspareunia  Musculoskeletal:  Negative for back pain, falls and joint pain.  Skin:  Negative for itching and rash.  Neurological:  Negative for dizziness and headaches.  Endo/Heme/Allergies:  Positive for environmental allergies.  Psychiatric/Behavioral:  Negative for depression, substance abuse and suicidal ideas.  The patient is nervous/anxious.    Past Medical History:  Past Medical History:  Diagnosis Date   Allergy    Anxiety    Depression    GERD (gastroesophageal reflux disease)    Heart murmur    Hypertension    IDA (iron deficiency anemia)     Past Surgical History:  Past Surgical History:  Procedure Laterality Date   BREAST BIOPSY Right 2018   benign   BREAST BIOPSY Left 04/22/2018    FIBROCYSTIC CHANGE WITH CALCIFICATIONS   CESAREAN SECTION  2002   CESAREAN SECTION  2007   CHOLECYSTECTOMY  2005   TONSILLECTOMY AND ADENOIDECTOMY Bilateral 1980    Gynecologic History:  Patient's last menstrual period was 09/20/2021. Menarche: 10  History of fibroids, polyps, or ovarian cysts? : no   History of PCOS? no Hstory of Endometriosis? no History of abnormal pap smears? no Have you had any sexually transmitted infections in the past?  no  Last Pap: Results were: 2019 NIL   She identifies as a female. She is sexually active with men.   She has dyspareunia. She denies postcoital bleeding.    Obstetric History: [id number]  Family History:  Family History  Problem Relation Age of Onset   Hypertension Mother    Diabetes Mother 64   Lymphoma Mother 83   Hypertension Father    Brain cancer Father    Cancer Maternal Aunt    Breast cancer Maternal Aunt 16   Mental retardation Maternal Uncle    Ovarian cancer Maternal Aunt 58   Endometrial cancer Maternal Aunt 58   Colon cancer Neg Hx    Esophageal  cancer Neg Hx    Stomach cancer Neg Hx    Rectal cancer Neg Hx     Social History:  Social History   Socioeconomic History   Marital status: Married    Spouse name: Not on file   Number of children: Not on file   Years of education: Not on file   Highest education level: Not on file  Occupational History   Not on file  Tobacco Use   Smoking status: Never   Smokeless tobacco: Never  Vaping Use   Vaping Use: Never used  Substance and Sexual Activity   Alcohol use: No    Drug use: No   Sexual activity: Yes  Other Topics Concern   Not on file  Social History Narrative   Not on file   Social Determinants of Health   Financial Resource Strain: Not on file  Food Insecurity: Not on file  Transportation Needs: Not on file  Physical Activity: Not on file  Stress: Not on file  Social Connections: Not on file  Intimate Partner Violence: Not on file    Allergies:  Allergies  Allergen Reactions   Butorphanol Tartrate    Sulfonamide Derivatives    Hctz [Hydrochlorothiazide] Rash   Other Rash    Medications: Prior to Admission medications   Medication Sig Start Date End Date Taking? Authorizing Provider  amLODipine (NORVASC) 2.5 MG tablet TAKE 1 TABLET BY MOUTH ONCE DAILY 06/18/21 06/18/22 Yes Crecencio Mc, MD  citalopram (CELEXA) 10 MG tablet TAKE 1 TABLET (10 MG TOTAL) BY MOUTH DAILY. 03/16/21 03/16/22 Yes McLean-Scocuzza, Nino Glow, MD  clindamycin-benzoyl peroxide (BENZACLIN) gel APPLY TO THE AFFECTED AREA(S) TWO TIMES DAILY AS NEEDED 11/07/20 11/07/21 Yes Crecencio Mc, MD  clobetasol cream (TEMOVATE) 0.05 % Apply to the affected area(s) two times daily 02/20/21  Yes Crecencio Mc, MD  ferrous sulfate (EQL SLOW RELEASE IRON) 160 (50 Fe) MG TBCR SR tablet Take 160 mg by mouth daily.   Yes [provider]  furosemide (LASIX) 20 MG tablet TAKE 1 TABLET (20 MG TOTAL) BY MOUTH DAILY. 08/15/21 08/15/22 Yes Crecencio Mc, MD  gabapentin (NEURONTIN) 300 MG capsule TAKE 1 CAPSULE (300 MG TOTAL) BY MOUTH 2 (TWO) TIMES DAILY. 03/16/21 03/16/22 Yes Crecencio Mc, MD  losartan (COZAAR) 100 MG tablet TAKE 1 TABLET BY MOUTH DAILY. 06/18/21 06/18/22 Yes Crecencio Mc, MD  Multiple Vitamins-Minerals (MULTIVITAMIN WITH MINERALS) tablet Take 1 tablet by mouth daily.   Yes [provider]  omeprazole (PRILOSEC) 40 MG capsule TAKE 1 CAPSULE BY MOUTH ONCE DAILY 03/16/21 03/16/22 Yes Crecencio Mc, MD  Semaglutide, 1 MG/DOSE, (OZEMPIC, 1 MG/DOSE,) 4 MG/3ML SOPN  Inject 1 mg into the skin once a week. 08/13/21  Yes Crecencio Mc, MD  azithromycin (ZITHROMAX Z-PAK) 250 MG tablet As directed Patient not taking: Reported on 09/20/2021 08/29/21   Chevis Pretty, FNP  benzonatate (TESSALON PERLES) 100 MG capsule Take 1 capsule (100 mg total) by mouth 3 (three) times daily as needed for cough. Patient not taking: Reported on 09/20/2021 08/29/21   Chevis Pretty, FNP  EPINEPHrine 0.3 mg/0.3 mL IJ SOAJ injection Inject 0.3 mLs (0.3 mg total) into the muscle once. Patient not taking: No sig reported 10/25/14   Crecencio Mc, MD  metoprolol tartrate (LOPRESSOR) 100 MG tablet Take 1 tablet (100 mg total) by mouth once for 1 dose. Take 2 hours prior to your CT scan. 07/26/21 07/27/21  Kate Sable, MD  predniSONE (DELTASONE) 10  MG tablet Take 6 tabs on day 1, 5 tabs on day 2, 4 tabs on day 3, 3 tabs on day 4, 2 tabs on day 5, 1 tab on day 6. Then Patient not taking: Reported on 09/20/2021 08/29/21   Chevis Pretty, FNP    Physical Exam Vitals: Blood pressure 120/72, height 5\' 6"  (1.676 m), weight 172 lb 9.6 oz (78.3 kg), last menstrual period 09/20/2021.  Physical Exam Constitutional:      Appearance: She is well-developed.  Genitourinary:     Genitourinary Comments: External: Normal appearing vulva. No lesions noted.  Speculum examination: Normal appearing cervix. No discharge.  Bimanual examination: Uterus midline, non-tender, normal in size, shape and contour.  No CMT. No adnexal masses. No adnexal tenderness. Pelvis not fixed.  Breast Exam: breast equal without skin changes, nipple discharge, breast lump or enlarged lymph nodes   HENT:     Head: Normocephalic and atraumatic.  Neck:     Thyroid: No thyromegaly.  Cardiovascular:     Rate and Rhythm: Normal rate and regular rhythm.     Heart sounds: Normal heart sounds.  Pulmonary:     Effort: Pulmonary effort is normal.     Breath sounds: Normal breath sounds.  Abdominal:      General: Bowel sounds are normal. There is no distension.     Palpations: Abdomen is soft. There is no mass.  Musculoskeletal:     Cervical back: Neck supple.  Neurological:     Mental Status: She is alert and oriented to person, place, and time.  Skin:    General: Skin is warm and dry.  Psychiatric:        Behavior: Behavior normal.        Thought Content: Thought content normal.        Judgment: Judgment normal.  Vitals reviewed.    Female chaperone present for pelvic and breast  portions of the physical exam  Assessment: 44 y.o. G2P2002 routine annual exam  Plan: Problem List Items Addressed This Visit   None Visit Diagnoses     Encounter for annual routine gynecological examination    -  Primary   Health maintenance examination       Cervical cancer screening       Relevant Orders   Cytology - PAP       1) Mammogram - recommend yearly screening mammogram.  Mammogram Is up to date  2) STI screening was offered and declined  3) Pap smear collected today   4) Contraception - tubal ligation  5) Colonoscopy -- May 2021, 10 year follow up advised    6) Routine healthcare maintenance including cholesterol, diabetes screening discussed managed by PCP  7) Reviewed options for management of menorrhagia  8) Provided her with information about genetic testing with [facility] because of her family history of cancer. Claus model lifetime risk 7.8% of breast cancer.     Adrian Prows MD, [facility], [facility] 09/20/2021 11:18 PM

## 2021-09-20 NOTE — Patient Instructions (Signed)
Institute of Medicine Recommended Dietary Allowances for Calcium and Vitamin D  Age (yr) Calcium Recommended Dietary Allowance (mg/day) Vitamin D Recommended Dietary Allowance (international units/day)  9-18 1,300 600  19-50 1,000 600  51-70 1,200 600  71 and older 1,200 800  Data from Institute of Medicine. Dietary reference intakes: calcium, vitamin D. [place], [place]: National Academies Press; 2011.   Exercising to Stay Healthy To become healthy and stay healthy, it is recommended that you do moderate-intensity and vigorous-intensity exercise. You can tell that you are exercising at a moderate intensity if your heart starts beating faster and you start breathing faster but can still hold a conversation. You can tell that you are exercising at a vigorous intensity if you are breathing much harder and faster and cannot hold a conversation while exercising. How can exercise benefit me? Exercising regularly is important. It has many health benefits, such as: Improving overall fitness, flexibility, and endurance. Increasing bone density. Helping with weight control. Decreasing body fat. Increasing muscle strength and endurance. Reducing stress and tension, anxiety, depression, or anger. Improving overall health. What guidelines should I follow while exercising? Before you start a new exercise program, talk with your health care provider. Do not exercise so much that you hurt yourself, feel dizzy, or get very short of breath. Wear comfortable clothes and wear shoes with good support. Drink plenty of water while you exercise to prevent dehydration or heat stroke. Work out until your breathing and your heartbeat get faster (moderate intensity). How often should I exercise? Choose an activity that you enjoy, and set realistic goals. Your health care provider can help you make an activity plan that is individually designed and works best for you. Exercise regularly as told by your health  care provider. This may include: Doing strength training two times a week, such as: Lifting weights. Using resistance bands. Push-ups. Sit-ups. Yoga. Doing a certain intensity of exercise for a given amount of time. Choose from these options: A total of 150 minutes of moderate-intensity exercise every week. A total of 75 minutes of vigorous-intensity exercise every week. A mix of moderate-intensity and vigorous-intensity exercise every week. Children, pregnant women, people who have not exercised regularly, people who are overweight, and older adults may need to talk with a health care provider about what activities are safe to perform. If you have a medical condition, be sure to talk with your health care provider before you start a new exercise program. What are some exercise ideas? Moderate-intensity exercise ideas include: Walking 1 mile (1.6 km) in about 15 minutes. Biking. Hiking. Golfing. Dancing. Water aerobics. Vigorous-intensity exercise ideas include: Walking 4.5 miles (7.2 km) or more in about 1 hour. Jogging or running 5 miles (8 km) in about 1 hour. Biking 10 miles (16.1 km) or more in about 1 hour. Lap swimming. Roller-skating or in-line skating. Cross-country skiing. Vigorous competitive sports, such as football, basketball, and soccer. Jumping rope. Aerobic dancing. What are some everyday activities that can help me get exercise? Yard work, such as: Pushing a lawn mower. Raking and bagging leaves. Washing your car. Pushing a stroller. Shoveling snow. Gardening. Washing windows or floors. How can I be more active in my day-to-day activities? Use stairs instead of an elevator. Take a walk during your lunch break. If you drive, park your car farther away from your work or school. If you take public transportation, get off one stop early and walk the rest of the way. Stand up or walk around during all of   your indoor phone calls. Get up, stretch, and walk  around every 30 minutes throughout the day. Enjoy exercise with a friend. Support to continue exercising will help you keep a regular routine of activity. Where to find more information You can find more information about exercising to stay healthy from: U.S. Department of Health and Human Services: www.hhs.gov Centers for Disease Control and Prevention (CDC): www.cdc.gov Summary Exercising regularly is important. It will improve your overall fitness, flexibility, and endurance. Regular exercise will also improve your overall health. It can help you control your weight, reduce stress, and improve your bone density. Do not exercise so much that you hurt yourself, feel dizzy, or get very short of breath. Before you start a new exercise program, talk with your health care provider. This information is not intended to replace advice given to you by your health care provider. Make sure you discuss any questions you have with your health care provider. Document Revised: 02/23/2021 Document Reviewed: 02/23/2021 Elsevier Patient Education  2022 Elsevier Inc. Budget-Friendly Healthy Eating There are many ways to save money at the grocery store and continue to eat healthy. You can be successful if you: Plan meals according to your budget. Make a grocery list and only purchase food according to your grocery list. Prepare food yourself at home. What are tips for following this plan? Reading food labels Compare food labels between brand name foods and the store brand. Often the nutritional value is the same, but the store brand is lower cost. Look for products that do not have added sugar, fat, or salt (sodium). These often cost the same but are healthier for you. Products may be labeled as: Sugar-free. Nonfat. Low-fat. Sodium-free. Low-sodium. Look for lean ground beef labeled as at least 92% lean and 8% fat. Shopping  Buy only the items on your grocery list and go only to the areas of the store  that have the items on your list. Use coupons only for foods and brands you normally buy. Avoid buying items you wouldn't normally buy simply because they are on sale. Check online and in newspapers for weekly deals. Buy healthy items from the bulk bins when available, such as herbs, spices, flour, pasta, nuts, and dried fruit. Buy fruits and vegetables that are in season. Prices are usually lower on in-season produce. Look at the unit price on the price tag. Use it to compare different brands and sizes to find out which item is the best deal. Choose healthy items that are often low-cost, such as carrots, potatoes, apples, bananas, and oranges. Dried or canned beans are a low-cost protein source. Buy in bulk and freeze extra food. Items you can buy in bulk include meats, fish, poultry, frozen fruits, and frozen vegetables. Avoid buying "ready-to-eat" foods, such as pre-cut fruits and vegetables and pre-made salads. If possible, shop around to discover where you can find the best prices. Consider other retailers such as dollar stores, larger wholesale stores, local fruit and vegetable stands, and farmers markets. Do not shop when you are hungry. If you shop while hungry, it may be hard to stick to your list and budget. Resist impulse buying. Use your grocery list as your official plan for the week. Buy a variety of vegetables and fruits by purchasing fresh, frozen, and canned items. Look at the top and bottom shelves for deals. Foods at eye level (eye level of an adult or child) are usually more expensive. Be efficient with your time when shopping. The more time you   spend at the store, the more money you are likely to spend. To save money when choosing more expensive foods like meats and dairy: Choose cheaper cuts of meat, such as bone-in chicken thighs and drumsticks instead of skinless and boneless chicken. When you are ready to prepare the chicken, you can remove the skin yourself to make it  healthier. Choose lean meats like chicken or turkey instead of beef. Choose canned seafood, such as tuna, salmon, or sardines. Buy eggs as a low-cost source of protein. Buy dried beans and peas, such as lentils, split peas, or kidney beans instead of meats. Dried beans and peas are a good alternative source of protein. Buy the larger tubs of yogurt instead of individual-sized containers. Choose water instead of sodas and other sweetened beverages. Avoid buying chips, cookies, and other "junk food." These items are usually expensive and not healthy. Cooking Make extra food and freeze the extras in meal-sized containers or in individual portions for fast meals and snacks. Pre-cook on days when you have extra time to prepare meals in advance. You can keep these meals in the fridge or freezer and reheat for a quick meal. When you come home from the grocery store, wash, peel, and cut fruits and vegetables so they are ready to use and eat. This will help reduce food waste. Meal planning Do not eat out or get fast food. Prepare food at home. Make a grocery list and make sure to bring it with you to the store. If you have a smart phone, you could use your phone to create your shopping list. Plan meals and snacks according to a grocery list and budget you create. Use leftovers in your meal plan for the week. Look for recipes where you can cook once and make enough food for two meals. Prepare budget-friendly types of meals like stews, casseroles, and stir-fry dishes. Try some meatless meals or try "no cook" meals like salads. Make sure that half your plate is filled with fruits or vegetables. Choose from fresh, frozen, or canned fruits and vegetables. If eating canned, remember to rinse them before eating. This will remove any excess salt added for packaging. Summary Eating healthy on a budget is possible if you plan your meals according to your budget, purchase according to your budget and grocery list,  and prepare food yourself. Tips for buying more food on a limited budget include buying generic brands, using coupons only for foods you normally buy, and buying healthy items from the bulk bins when available. Tips for buying cheaper food to replace expensive food include choosing cheaper, lean cuts of meat, and buying dried beans and peas. This information is not intended to replace advice given to you by your health care provider. Make sure you discuss any questions you have with your health care provider. Document Revised: 08/10/2020 Document Reviewed: 08/10/2020 Elsevier Patient Education  2022 Elsevier Inc. Bone Health Bones protect organs, store calcium, anchor muscles, and support the whole body. Keeping your bones strong is important, especially as you get older. You can take actions to help keep your bones strong and healthy. Why is keeping my bones healthy important? Keeping your bones healthy is important because your body constantly replaces bone cells. Cells get old, and new cells take their place. As we age, we lose bone cells because the body may not be able to make enough new cells to replace the old cells. The amount of bone cells and bone tissue you have is referred to as   bone mass. The higher your bone mass, the stronger your bones. The aging process leads to an overall loss of bone mass in the body, which can increase the likelihood of: Broken bones. A condition in which the bones become weak and brittle (osteoporosis). A large decline in bone mass occurs in older adults. In women, it occurs about the time of menopause. What actions can I take to keep my bones healthy? Good health habits are important for maintaining healthy bones. This includes eating nutritious foods and exercising regularly. To have healthy bones, you need to get enough of the right minerals and vitamins. Most nutrition experts recommend getting these nutrients from the foods that you eat. In some cases,  taking supplements may also be recommended. Doing certain types of exercise is also important for bone health. What are the nutritional recommendations for healthy bones? Eating a well-balanced diet with plenty of calcium and vitamin D will help to protect your bones. Nutritional recommendations vary from person to person. Ask your health care provider what is healthy for you. Here are some general guidelines. Get enough calcium Calcium is the most important (essential) mineral for bone health. Most people can get enough calcium from their diet, but supplements may be recommended for people who are at risk for osteoporosis. Good sources of calcium include: Dairy products, such as low-fat or nonfat milk, cheese, and yogurt. Dark green leafy vegetables, such as bok choy and broccoli. Foods that have calcium added to them (are fortified). Foods that may be fortified with calcium include orange juice, cereal, bread, soy beverages, and tofu products. Nuts, such as almonds. Follow these recommended amounts for daily calcium intake: Infants, 0-6 months: 200 mg. Infants, 6-12 months: 260 mg. Children, age 1-3: 700 mg. Children, age 4-8: 1,000 mg. Children, age 9-13: 1,300 mg. Teens, age 14-18: 1,300 mg. Adults, age 19-50: 1,000 mg. Adults, age 51-70: Men: 1,000 mg. Women: 1,200 mg. Adults, age 71 or older: 1,200 mg. Pregnant and breastfeeding females: Teens: 1,300 mg. Adults: 1,000 mg. Get enough vitamin D Vitamin D is the most essential vitamin for bone health. It helps the body absorb calcium. Sunlight stimulates the skin to make vitamin D, so be sure to get enough sunlight. If you live in a cold climate or you do not get outside often, your health care provider may recommend that you take vitamin D supplements. Good sources of vitamin D in your diet include: Egg yolks. Saltwater fish. Milk and cereal fortified with vitamin D. Follow these recommended amounts for daily vitamin D  intake: Infants, 0-12 months: 400 international units (IU). Children and teens, age 1-18: 600 international units. Adults, age 59 or younger: 600 international units. Adults, age 60 or older: 600-1,000 international units. Get other important nutrients Other nutrients that are important for bone health include: Phosphorus. This mineral is found in meat, poultry, dairy foods, nuts, and legumes. The recommended daily intake for adult men and adult women is 700 mg. Magnesium. This mineral is found in seeds, nuts, dark green vegetables, and legumes. The recommended daily intake for adult men is 400-420 mg. For adult women, it is 310-320 mg. Vitamin K. This vitamin is found in green leafy vegetables. The recommended daily intake is 120 mcg for adult men and 90 mcg for adult women. What type of physical activity is best for building and maintaining healthy bones? Weight-bearing and strength-building activities are important for building and maintaining healthy bones. Weight-bearing activities cause muscles and bones to work against gravity. Strength-building activities   increase the strength of the muscles that support bones. Weight-bearing and muscle-building activities include: Walking and hiking. Jogging and running. Dancing. Gym exercises. Lifting weights. Tennis and racquetball. Climbing stairs. Aerobics. Adults should get at least 30 minutes of moderate physical activity on most days. Children should get at least 60 minutes of moderate physical activity on most days. Ask your health care provider what type of exercise is best for you. How can I find out if my bone mass is low? Bone mass can be measured with an X-ray test called a bone mineral density (BMD) test. This test is recommended for all women who are age 65 or older. It may also be recommended for: Men who are age 70 or older. People who are at risk for osteoporosis because of: Having a long-term disease that weakens bones, such as  kidney disease or rheumatoid arthritis. Having menopause earlier than normal. Taking medicine that weakens bones, such as steroids, thyroid hormones, or hormone treatment for breast cancer or prostate cancer. Smoking. Drinking three or more alcoholic drinks a day. Being underweight. Sedentary lifestyle. If you find that you have a low bone mass, you may be able to prevent osteoporosis or further bone loss by changing your diet and lifestyle. Where can I find more information? Bone Health & Osteoporosis Foundation: www.nof.org/patients National Institutes of Health: www.bones.nih.gov International Osteoporosis Foundation: www.iofbonehealth.org Summary The aging process leads to an overall loss of bone mass in the body, which can increase the likelihood of broken bones and osteoporosis. Eating a well-balanced diet with plenty of calcium and vitamin D will help to protect your bones. Weight-bearing and strength-building activities are also important for building and maintaining strong bones. Bone mass can be measured with an X-ray test called a bone mineral density (BMD) test. This information is not intended to replace advice given to you by your health care provider. Make sure you discuss any questions you have with your health care provider. Document Revised: 04/11/2021 Document Reviewed: 04/11/2021 Elsevier Patient Education  2022 Elsevier Inc.  

## 2021-09-20 NOTE — Patient Instructions (Signed)

## 2021-09-20 NOTE — Progress Notes (Signed)
Follow-Up Visit   Subjective  Autumn Martinez is a 45 y.o. female who presents for the following: FBSE (Patient here for full body skin exam and skin cancer screening. Patient does not have hx of skin cancer, no new or changing spots patient is aware of. ) and Facial Elastosis (Patient here for botox injections at forehead. ).  The following portions of the chart were reviewed this encounter and updated as appropriate:   Tobacco  Allergies  Meds  Problems  Med Hx  Surg Hx  Fam Hx      Review of Systems:  No other skin or systemic complaints except as noted in HPI or Assessment and Plan.  Objective  Well appearing patient in no apparent distress; mood and affect are within normal limits.  A full examination was performed including scalp, head, eyes, ears, nose, lips, neck, chest, axillae, abdomen, back, buttocks, bilateral upper extremities, bilateral lower extremities, hands, feet, fingers, toes, fingernails, and toenails. All findings within normal limits unless otherwise noted below.  face Rhytides and volume loss.      Right Posterior Calf 0.35 cm pink papule with central dark brown line, no suspicious features on dermoscopy        Assessment & Plan  Elastosis of skin face  Filling material injection - face Location: See attached image  Informed consent: Discussed risks (infection, pain, bleeding, bruising, swelling, allergic reaction, paralysis of nearby muscles, eyelid droop, double vision, neck weakness, difficulty breathing, headache, undesirable cosmetic result, and need for additional treatment) and benefits of the procedure, as well as the alternatives.  Informed consent was obtained.  Preparation: The area was cleansed with alcohol.  Procedure Details:  Botox was injected into the dermis with a 30-gauge needle. Pressure applied to any bleeding. Ice packs offered for swelling.  Lot Number:  [id number] Expiration:  06/2023  Total Units Injected:   10  Plan: Tylenol may be used for headache.  Allow 2 weeks before returning to clinic for additional dosing as needed. Patient will call for any problems.   Nevus Right Posterior Calf  Benign-appearing.  Recheck in 3 months.  Call clinic for new or changing lesions.  Recommend daily use of broad spectrum spf 30+ sunscreen to sun-exposed areas.    Lentigines - Scattered tan macules - Due to sun exposure - Benign-appearing, observe - Recommend daily broad spectrum sunscreen SPF 30+ to sun-exposed areas, reapply every 2 hours as needed. - Call for any changes  Seborrheic Keratoses - Stuck-on, waxy, tan-brown papules and/or plaques  - Benign-appearing - Discussed benign etiology and prognosis. - Observe - Call for any changes  Melanocytic Nevi - Tan-brown and/or pink-flesh-colored symmetric macules and papules - Benign appearing on exam today - Observation - Call clinic for new or changing moles - Recommend daily use of broad spectrum spf 30+ sunscreen to sun-exposed areas.   Hemangiomas - Red papules - Discussed benign nature - Observe - Call for any changes  Actinic Damage - Chronic condition, secondary to cumulative UV/sun exposure - diffuse scaly erythematous macules with underlying dyspigmentation - Recommend daily broad spectrum sunscreen SPF 30+ to sun-exposed areas, reapply every 2 hours as needed.  - Staying in the shade or wearing long sleeves, sun glasses (UVA+UVB protection) and wide brim hats (4-inch brim around the entire circumference of the hat) are also recommended for sun protection.  - Call for new or changing lesions.  Skin cancer screening performed today.  Return in about 3 months (around 12/21/2021) for recheck  nevus .  Graciella Belton, RMA, am acting as scribe for Forest Gleason, MD .  Documentation: I have reviewed the above documentation for accuracy and completeness, and I agree with the above.  Forest Gleason, MD

## 2021-09-24 ENCOUNTER — Other Ambulatory Visit: Payer: Self-pay

## 2021-09-25 ENCOUNTER — Encounter: Payer: Self-pay | Admitting: Dermatology

## 2021-09-25 LAB — CYTOLOGY - PAP
Chlamydia: NEGATIVE
Comment: NEGATIVE
Comment: NEGATIVE
Comment: NEGATIVE
Comment: NORMAL
Diagnosis: NEGATIVE
High risk HPV: NEGATIVE
Neisseria Gonorrhea: NEGATIVE
Trichomonas: NEGATIVE

## 2021-09-27 ENCOUNTER — Ambulatory Visit: Payer: 59 | Admitting: Internal Medicine

## 2021-09-27 ENCOUNTER — Other Ambulatory Visit: Payer: 59

## 2021-10-08 ENCOUNTER — Other Ambulatory Visit: Payer: Self-pay

## 2021-10-09 ENCOUNTER — Telehealth (HOSPITAL_COMMUNITY): Payer: Self-pay | Admitting: *Deleted

## 2021-10-09 NOTE — Telephone Encounter (Signed)
Attempted to call patient regarding upcoming cardiac CT appointment and to obtain blood work prior to appointment. Left message on voicemail with name and callback number  Gordy Clement RN Navigator Cardiac [facility] [phone] Office [phone] Cell

## 2021-10-10 ENCOUNTER — Encounter: Payer: Self-pay | Admitting: Obstetrics and Gynecology

## 2021-10-10 ENCOUNTER — Other Ambulatory Visit: Payer: Self-pay

## 2021-10-10 ENCOUNTER — Other Ambulatory Visit (HOSPITAL_COMMUNITY): Payer: Self-pay | Admitting: *Deleted

## 2021-10-10 ENCOUNTER — Other Ambulatory Visit: Payer: Self-pay | Admitting: Licensed Practical Nurse

## 2021-10-10 ENCOUNTER — Ambulatory Visit (HOSPITAL_COMMUNITY): Payer: 59 | Admitting: *Deleted

## 2021-10-10 DIAGNOSIS — Z01812 Encounter for preprocedural laboratory examination: Secondary | ICD-10-CM

## 2021-10-10 DIAGNOSIS — B3731 Acute candidiasis of vulva and vagina: Secondary | ICD-10-CM

## 2021-10-10 MED ORDER — FLUCONAZOLE 150 MG PO TABS
150.0000 mg | ORAL_TABLET | ORAL | 0 refills | Status: AC
  Filled 2021-10-10: qty 2, 6d supply, fill #0

## 2021-10-10 NOTE — Progress Notes (Signed)
Pt sent my chart message requesting Diflucan, per pap: fungal organisms present consistent with Candida. Pt was recently treated with antibiotics and wonders if she now has a yeast infection. Script sent to pharmacy on file

## 2021-10-10 NOTE — Telephone Encounter (Signed)
Reaching out to patient to offer assistance regarding upcoming cardiac imaging study; pt verbalizes understanding of appt date/time, but she wishes to cancel her scan due some family issues.  She didn't know when she would like to reschedule and I informed her that I would pass her chart to our scheduler to get back in touch with her.  I informed that she will need blood work within 30 days of scan and could go to any [facility] to have that done. She verbalized understanding.  Gordy Clement RN Navigator Cardiac Imaging [facility] [phone] office [phone] cell

## 2021-10-11 ENCOUNTER — Ambulatory Visit: Admission: RE | Admit: 2021-10-11 | Payer: 59 | Source: Ambulatory Visit

## 2021-10-11 DIAGNOSIS — Z803 Family history of malignant neoplasm of breast: Secondary | ICD-10-CM

## 2021-10-11 HISTORY — DX: Family history of malignant neoplasm of breast: Z80.3

## 2021-10-17 ENCOUNTER — Encounter: Payer: Self-pay | Admitting: Obstetrics and Gynecology

## 2021-10-18 DIAGNOSIS — H524 Presbyopia: Secondary | ICD-10-CM | POA: Diagnosis not present

## 2021-10-18 DIAGNOSIS — H40033 Anatomical narrow angle, bilateral: Secondary | ICD-10-CM | POA: Diagnosis not present

## 2021-11-05 ENCOUNTER — Other Ambulatory Visit: Payer: Self-pay | Admitting: Nurse Practitioner

## 2021-11-05 ENCOUNTER — Other Ambulatory Visit: Payer: Self-pay | Admitting: Internal Medicine

## 2021-11-05 ENCOUNTER — Other Ambulatory Visit: Payer: Self-pay

## 2021-11-05 DIAGNOSIS — K219 Gastro-esophageal reflux disease without esophagitis: Secondary | ICD-10-CM

## 2021-11-05 MED FILL — Clindamycin Phosphate-Benzoyl Peroxide Gel 1-5%: CUTANEOUS | 30 days supply | Qty: 50 | Fill #2 | Status: CN

## 2021-11-06 ENCOUNTER — Other Ambulatory Visit: Payer: Self-pay

## 2021-11-06 MED ORDER — FUROSEMIDE 20 MG PO TABS
ORAL_TABLET | Freq: Every day | ORAL | 0 refills | Status: DC
  Filled 2021-11-06: qty 90, 90d supply, fill #0

## 2021-11-06 MED ORDER — GABAPENTIN 300 MG PO CAPS
ORAL_CAPSULE | Freq: Two times a day (BID) | ORAL | 1 refills | Status: DC
  Filled 2021-11-06: qty 180, 90d supply, fill #0

## 2021-11-06 MED ORDER — OMEPRAZOLE 40 MG PO CPDR
DELAYED_RELEASE_CAPSULE | Freq: Every day | ORAL | 1 refills | Status: DC
  Filled 2021-11-06 – 2022-10-12 (×2): qty 90, 90d supply, fill #0

## 2021-11-06 MED ORDER — OZEMPIC (1 MG/DOSE) 4 MG/3ML ~~LOC~~ SOPN
1.0000 mg | PEN_INJECTOR | SUBCUTANEOUS | 2 refills | Status: DC
Start: 2021-11-06 — End: 2022-02-20
  Filled 2021-11-06 (×2): qty 9, 84d supply, fill #0

## 2021-11-06 MED FILL — Clindamycin Phosphate-Benzoyl Peroxide Gel 1-5%: CUTANEOUS | 30 days supply | Qty: 50 | Fill #2 | Status: AC

## 2021-11-07 ENCOUNTER — Other Ambulatory Visit: Payer: Self-pay

## 2021-11-08 ENCOUNTER — Ambulatory Visit
Admission: RE | Admit: 2021-11-08 | Discharge: 2021-11-08 | Disposition: A | Discharge status: Home / Self Care | Payer: 59 | Source: Ambulatory Visit

## 2021-11-08 DIAGNOSIS — Z1231 Encounter for screening mammogram for malignant neoplasm of breast: Secondary | ICD-10-CM | POA: Diagnosis not present

## 2021-11-13 ENCOUNTER — Encounter: Payer: Self-pay | Admitting: Internal Medicine

## 2021-11-13 DIAGNOSIS — R928 Other abnormal and inconclusive findings on diagnostic imaging of breast: Secondary | ICD-10-CM

## 2021-11-15 ENCOUNTER — Encounter: Payer: Self-pay | Admitting: Internal Medicine

## 2021-11-15 ENCOUNTER — Ambulatory Visit: Payer: 59 | Admitting: Internal Medicine

## 2021-11-15 ENCOUNTER — Ambulatory Visit (INDEPENDENT_AMBULATORY_CARE_PROVIDER_SITE_OTHER): Payer: 59

## 2021-11-15 ENCOUNTER — Other Ambulatory Visit: Payer: Self-pay

## 2021-11-15 VITALS — BP 118/88 | HR 74 | Temp 98.2°F | Ht 64.0 in | Wt 166.2 lb

## 2021-11-15 DIAGNOSIS — R5383 Other fatigue: Secondary | ICD-10-CM | POA: Diagnosis not present

## 2021-11-15 DIAGNOSIS — F339 Major depressive disorder, recurrent, unspecified: Secondary | ICD-10-CM | POA: Diagnosis not present

## 2021-11-15 DIAGNOSIS — E785 Hyperlipidemia, unspecified: Secondary | ICD-10-CM | POA: Diagnosis not present

## 2021-11-15 DIAGNOSIS — F4310 Post-traumatic stress disorder, unspecified: Secondary | ICD-10-CM

## 2021-11-15 DIAGNOSIS — R0609 Other forms of dyspnea: Secondary | ICD-10-CM | POA: Diagnosis not present

## 2021-11-15 DIAGNOSIS — I10 Essential (primary) hypertension: Secondary | ICD-10-CM

## 2021-11-15 DIAGNOSIS — R7303 Prediabetes: Secondary | ICD-10-CM

## 2021-11-15 DIAGNOSIS — F3281 Premenstrual dysphoric disorder: Secondary | ICD-10-CM | POA: Diagnosis not present

## 2021-11-15 LAB — ECHOCARDIOGRAM COMPLETE
AR max vel: 2.46 cm2
AV Area VTI: 2.43 cm2
AV Area mean vel: 2.17 cm2
AV Mean grad: 6 mmHg
AV Peak grad: 9.7 mmHg
Ao pk vel: 1.56 m/s
Area-P 1/2: 3.91 cm2
Calc EF: 69.7 %
Height: 64 in
S' Lateral: 3 cm
Single Plane A2C EF: 77.7 %
Single Plane A4C EF: 62.3 %
Weight: 2659.2 oz

## 2021-11-15 MED ORDER — BUSPIRONE HCL 10 MG PO TABS
10.0000 mg | ORAL_TABLET | Freq: Two times a day (BID) | ORAL | 1 refills | Status: DC
  Filled 2021-11-15: qty 60, 30d supply, fill #0
  Filled 2022-03-14: qty 60, 30d supply, fill #1

## 2021-11-15 NOTE — Telephone Encounter (Signed)
Pt is aware.  

## 2021-11-15 NOTE — Assessment & Plan Note (Signed)
Significantly improved since starting ozempic

## 2021-11-15 NOTE — Progress Notes (Signed)
Subjective:  Patient ID: Autumn Martinez, female    DOB: 1976/08/22  Age: 46 y.o. MRN: [id number]  CC: The primary encounter diagnosis was Essential hypertension. Diagnoses of Prediabetes, Hyperlipidemia, unspecified hyperlipidemia type, Other fatigue, PTSD (post-traumatic stress disorder), Depression, recurrent ([place]), and PMDD (premenstrual dysphoric disorder) were also pertinent to this visit.  HPI Autumn Martinez presents for  Chief Complaint  Patient presents with   Follow-up    6 month follow up on hypertension   This visit occurred during the SARS-CoV-2 public health emergency.  Safety protocols were in place, including screening questions prior to the visit, additional usage of staff PPE, and extensive cleaning of exam room while observing appropriate contact time as indicated for disinfecting solutions.    HTN: Patient is taking her medications as prescribed (amlodipine 2.5 mg  , losartan 100 )and notes no adverse effects.  Home BP readings have been done about once per week and are  generally < 130/80 .  She is avoiding added salt in her diet and walking regularly about 3 times per week for exercise  .   GAD:  Celexa helping her anxiety and menopause symptoms .  Insomnia worse during premenstrual period, using melatonin with partial success.  discussed adding buspirone for premenstrual dysphoria.    Prediabetes:/overweight :    has been taking 1 mg ozempic since sept.  10 lbs lost with  use of medication.  Goal is 130 .  Feels great,  more energy,  running  4 days per week .  Has enough til March. Will need to switch to wegovy at that point.    Outpatient Medications Prior to Visit  Medication Sig Dispense Refill   amLODipine (NORVASC) 2.5 MG tablet TAKE 1 TABLET BY MOUTH ONCE DAILY 90 tablet 1   citalopram (CELEXA) 10 MG tablet TAKE 1 TABLET (10 MG TOTAL) BY MOUTH DAILY. 90 tablet 3   clindamycin-benzoyl peroxide (BENZACLIN) gel APPLY TO THE AFFECTED AREA(S) TWO  TIMES DAILY AS NEEDED 50 g 5   ferrous sulfate (EQL SLOW RELEASE IRON) 160 (50 Fe) MG TBCR SR tablet Take 160 mg by mouth daily.     furosemide (LASIX) 20 MG tablet TAKE 1 TABLET (20 MG TOTAL) BY MOUTH DAILY. 90 tablet 0   gabapentin (NEURONTIN) 300 MG capsule TAKE 1 CAPSULE (300 MG TOTAL) BY MOUTH 2 (TWO) TIMES DAILY. 180 capsule 1   losartan (COZAAR) 100 MG tablet TAKE 1 TABLET BY MOUTH DAILY. 90 tablet 3   Multiple Vitamins-Minerals (MULTIVITAMIN WITH MINERALS) tablet Take 1 tablet by mouth daily.     omeprazole (PRILOSEC) 40 MG capsule TAKE 1 CAPSULE BY MOUTH ONCE DAILY 90 capsule 1   predniSONE (DELTASONE) 10 MG tablet Take 6 tabs on day 1, 5 tabs on day 2, 4 tabs on day 3, 3 tabs on day 4, 2 tabs on day 5, 1 tab on day 6. Then 21 tablet 0   Semaglutide, 1 MG/DOSE, (OZEMPIC, 1 MG/DOSE,) 4 MG/3ML SOPN Inject 1 mg into the skin once a week. 3 mL 2   metoprolol tartrate (LOPRESSOR) 100 MG tablet Take 1 tablet (100 mg total) by mouth once for 1 dose. Take 2 hours prior to your CT scan. 1 tablet 0   azithromycin (ZITHROMAX Z-PAK) 250 MG tablet As directed (Patient not taking: Reported on 09/20/2021) 6 tablet 0   benzonatate (TESSALON PERLES) 100 MG capsule Take 1 capsule (100 mg total) by mouth 3 (three) times daily as needed for cough. (Patient not  taking: Reported on 09/20/2021) 20 capsule 0   clobetasol cream (TEMOVATE) 0.05 % Apply to the affected area(s) two times daily 30 g 0   EPINEPHrine 0.3 mg/0.3 mL IJ SOAJ injection Inject 0.3 mLs (0.3 mg total) into the muscle once. (Patient not taking: No sig reported) 1 Device 1   No facility-administered medications prior to visit.    Review of Systems;  Patient denies headache, fevers, malaise, unintentional weight loss, skin rash, eye pain, sinus congestion and sinus pain, sore throat, dysphagia,  hemoptysis , cough, dyspnea, wheezing, chest pain, palpitations, orthopnea, edema, abdominal pain, nausea, melena, diarrhea, constipation, flank pain,  dysuria, hematuria, urinary  Frequency, nocturia, numbness, tingling, seizures,  Focal weakness, Loss of consciousness,  Tremor, insomnia, depression, anxiety, and suicidal ideation.      Objective:  BP 118/88 (BP Location: Right Arm, Patient Position: Sitting, Cuff Size: Normal)    Pulse 74    Temp 98.2 F (36.8 C) (Oral)    Ht 5\' 6"  (1.676 m)    Wt 166 lb 3.2 oz (75.4 kg)    SpO2 95%    BMI 26.83 kg/m   BP Readings from Last 3 Encounters:  11/15/21 118/88  09/20/21 120/72  07/26/21 136/70    Wt Readings from Last 3 Encounters:  11/15/21 166 lb 3.2 oz (75.4 kg)  09/20/21 172 lb 9.6 oz (78.3 kg)  07/26/21 176 lb (79.8 kg)    General appearance: alert, cooperative and appears stated age Ears: normal TM's and external ear canals both ears Throat: lips, mucosa, and tongue normal; teeth and gums normal Neck: no adenopathy, no carotid bruit, supple, symmetrical, trachea midline and thyroid not enlarged, symmetric, no tenderness/mass/nodules Back: symmetric, no curvature. ROM normal. No CVA tenderness. Lungs: clear to auscultation bilaterally Heart: regular rate and rhythm, S1, S2 normal, no murmur, click, rub or gallop Abdomen: soft, non-tender; bowel sounds normal; no masses,  no organomegaly Pulses: 2+ and symmetric Skin: Skin color, texture, turgor normal. No rashes or lesions Lymph nodes: Cervical, supraclavicular, and axillary nodes normal.  Lab Results  Component Value Date   HGBA1C 5.6 03/27/2021   HGBA1C 5.9 09/23/2019    Lab Results  Component Value Date   CREATININE 0.72 07/26/2021   CREATININE 0.73 03/27/2021   CREATININE 0.89 09/27/2020    Lab Results  Component Value Date   WBC 5.6 03/27/2021   HGB 13.4 03/27/2021   HCT 39.1 03/27/2021   PLT 277.0 03/27/2021   GLUCOSE 94 07/26/2021   CHOL 227 (H) 03/27/2021   TRIG 96.0 03/27/2021   HDL 59.70 03/27/2021   LDLCALC 148 (H) 03/27/2021   ALT 17 03/27/2021   AST 20 03/27/2021   NA 139 07/26/2021   K 4.4  07/26/2021   CL 101 07/26/2021   CREATININE 0.72 07/26/2021   BUN 12 07/26/2021   CO2 21 07/26/2021   TSH 1.33 09/23/2019   INR 1.0 08/30/2016   HGBA1C 5.6 03/27/2021    MM 3D SCREEN BREAST BILATERAL  Result Date: 11/09/2021 CLINICAL DATA:  Screening. EXAM: DIGITAL SCREENING BILATERAL MAMMOGRAM WITH TOMOSYNTHESIS AND CAD TECHNIQUE: Bilateral screening digital craniocaudal and mediolateral oblique mammograms were obtained. Bilateral screening digital breast tomosynthesis was performed. The images were evaluated with computer-aided detection. COMPARISON:  Previous exam(s). ACR Breast Density Category c: The breast tissue is heterogeneously dense, which may obscure small masses. FINDINGS: In the left breast, a possible asymmetry warrants further evaluation. In the right breast, no findings suspicious for malignancy. IMPRESSION: Further evaluation is suggested for possible asymmetry  in the left breast. RECOMMENDATION: Diagnostic mammogram and possibly ultrasound of the left breast. (Code:[id number]) The patient will be contacted regarding the findings, and additional imaging will be scheduled. BI-RADS CATEGORY  0: Incomplete. Need additional imaging evaluation and/or prior mammograms for comparison. Electronically Signed   By: Dorise Bullion III M.D.   On: 11/09/2021 13:02    Assessment & Plan:   Problem List Items Addressed This Visit     Essential hypertension - Primary    Well controlled on current regimen of amlodipine and losartan . Renal function stable by September labs, no changes today. hoem readings are 120/80 or less       Relevant Orders   Comprehensive metabolic panel   Microalbumin / creatinine urine ratio   Fatigue    Significantly improved since starting ozempic      PTSD (post-traumatic stress disorder)    Controlled with citalopram 10 mg daily       Relevant Medications   busPIRone (BUSPAR) 10 MG tablet   PMDD (premenstrual dysphoric disorder)    Adding buspirone 10  mg bid for symptoms including insomnia      Relevant Medications   busPIRone (BUSPAR) 10 MG tablet   Prediabetes    Improved a1c with weight loss  Continue pharmacotherapy with ozempic/wegovy currently on 1 mg daily       Relevant Orders   Hemoglobin A1c   Depression, recurrent (H[facility])    Symptoms are stable on celexa.  No changes today      Relevant Medications   busPIRone (BUSPAR) 10 MG tablet   Other Visit Diagnoses     Hyperlipidemia, unspecified hyperlipidemia type       Relevant Orders   Lipid panel       Meds ordered this encounter  Medications   busPIRone (BUSPAR) 10 MG tablet    Sig: Take 1 tablet (10 mg total) by mouth 2 (two) times daily.    Dispense:  60 tablet    Refill:  1    Follow-up: No follow-ups on file.   Crecencio Mc, MD

## 2021-11-15 NOTE — Assessment & Plan Note (Signed)
Improved a1c with weight loss  Continue pharmacotherapy with ozempic/wegovy currently on 1 mg daily

## 2021-11-15 NOTE — Assessment & Plan Note (Signed)
Symptoms are stable on celexa.  No changes today

## 2021-11-15 NOTE — Assessment & Plan Note (Signed)
Adding buspirone 10 mg bid for symptoms including insomnia

## 2021-11-15 NOTE — Assessment & Plan Note (Signed)
Well controlled on current regimen of amlodipine and losartan . Renal function stable by September labs, no changes today. hoem readings are 120/80 or less

## 2021-11-15 NOTE — Assessment & Plan Note (Addendum)
Controlled with citalopram 10 mg daily

## 2021-11-15 NOTE — Patient Instructions (Signed)
Good to see you  No change to meds today except adding buspirone bid for premenstrual dysphoria   If weight plateaus on 1 mg, you can increase dose to 2 mg daily using the pens you have.  You can  stretch out the interval to every 10 days if the weight loss on 2 lbs is too fast   Will switch to [facility] when needed

## 2021-11-20 ENCOUNTER — Other Ambulatory Visit: Payer: Self-pay

## 2021-11-20 ENCOUNTER — Telehealth: Payer: Self-pay | Admitting: *Deleted

## 2021-11-20 NOTE — Telephone Encounter (Signed)
Left voicemail message to call back for review of results.  

## 2021-11-20 NOTE — Telephone Encounter (Signed)
-----   Message from Kate Sable, MD sent at 11/20/2021 11:14 AM EST ----- Echo with normal systolic function, normal diastolic function.  No significant gross abnormalities to suggest etiology of chest pain.  Mild MR.

## 2021-11-21 ENCOUNTER — Other Ambulatory Visit: Payer: Self-pay

## 2021-11-22 NOTE — Telephone Encounter (Signed)
Results reviewed with patient and she had no further questions at this time.

## 2021-11-30 ENCOUNTER — Encounter (HOSPITAL_COMMUNITY): Payer: Self-pay

## 2021-12-13 ENCOUNTER — Other Ambulatory Visit: Payer: Self-pay

## 2021-12-20 ENCOUNTER — Ambulatory Visit
Admission: RE | Admit: 2021-12-20 | Discharge: 2021-12-20 | Disposition: A | Discharge status: Home / Self Care | Payer: 59 | Source: Ambulatory Visit | Attending: Internal Medicine | Admitting: Internal Medicine

## 2021-12-20 ENCOUNTER — Other Ambulatory Visit: Payer: Self-pay | Admitting: Internal Medicine

## 2021-12-20 DIAGNOSIS — N6489 Other specified disorders of breast: Secondary | ICD-10-CM | POA: Diagnosis not present

## 2021-12-20 DIAGNOSIS — R922 Inconclusive mammogram: Secondary | ICD-10-CM | POA: Diagnosis not present

## 2021-12-20 DIAGNOSIS — R928 Other abnormal and inconclusive findings on diagnostic imaging of breast: Secondary | ICD-10-CM

## 2021-12-23 ENCOUNTER — Encounter: Payer: Self-pay | Admitting: Internal Medicine

## 2021-12-25 ENCOUNTER — Other Ambulatory Visit: Payer: Self-pay

## 2021-12-25 ENCOUNTER — Telehealth: Payer: Self-pay | Admitting: Pharmacist

## 2021-12-25 MED ORDER — WEGOVY 1.7 MG/0.75ML ~~LOC~~ SOAJ
1.7000 mg | SUBCUTANEOUS | 2 refills | Status: DC
  Filled 2021-12-25 – 2022-01-04 (×6): qty 3, 28d supply, fill #0

## 2021-12-25 NOTE — Telephone Encounter (Signed)
CAtie,    I've sent wegovy 1.7 mg dose to [facility] per patient  request .  Are you helping with the PA? If not,  can you route to [place]?  Thanks  TT

## 2021-12-25 NOTE — Telephone Encounter (Signed)
PA for Wegovy 1.7 mg submitted via Cover My Meds (Key [id number])

## 2022-01-01 ENCOUNTER — Other Ambulatory Visit (HOSPITAL_BASED_OUTPATIENT_CLINIC_OR_DEPARTMENT_OTHER): Payer: Self-pay

## 2022-01-01 ENCOUNTER — Other Ambulatory Visit: Payer: Self-pay

## 2022-01-02 ENCOUNTER — Other Ambulatory Visit: Payer: Self-pay

## 2022-01-02 ENCOUNTER — Ambulatory Visit: Payer: 59 | Admitting: Dermatology

## 2022-01-03 ENCOUNTER — Other Ambulatory Visit: Payer: Self-pay

## 2022-01-04 ENCOUNTER — Other Ambulatory Visit: Payer: Self-pay

## 2022-01-15 ENCOUNTER — Telehealth: Payer: Self-pay

## 2022-01-15 NOTE — Telephone Encounter (Signed)
LVM for pt we need to reschedule appt this week with Dr. Laurence Ferrari. ?Lurlean Horns., RMA ?

## 2022-01-17 ENCOUNTER — Ambulatory Visit: Payer: 59 | Admitting: Dermatology

## 2022-01-17 ENCOUNTER — Encounter: Payer: Self-pay | Admitting: Dermatology

## 2022-01-28 ENCOUNTER — Encounter: Payer: Self-pay | Admitting: Internal Medicine

## 2022-01-28 ENCOUNTER — Other Ambulatory Visit: Payer: Self-pay | Admitting: Nurse Practitioner

## 2022-01-29 ENCOUNTER — Other Ambulatory Visit: Payer: Self-pay

## 2022-01-29 MED ORDER — DOXYCYCLINE HYCLATE 100 MG PO TABS
100.0000 mg | ORAL_TABLET | Freq: Two times a day (BID) | ORAL | 0 refills | Status: DC
  Filled 2022-01-29: qty 28, 14d supply, fill #0

## 2022-01-29 MED ORDER — PREDNISONE 10 MG PO TABS
ORAL_TABLET | ORAL | 0 refills | Status: DC
  Filled 2022-01-29: qty 33, 8d supply, fill #0

## 2022-02-16 ENCOUNTER — Encounter: Payer: Self-pay | Admitting: Internal Medicine

## 2022-02-17 ENCOUNTER — Other Ambulatory Visit: Payer: Self-pay | Admitting: Internal Medicine

## 2022-02-18 ENCOUNTER — Other Ambulatory Visit: Payer: Self-pay

## 2022-02-18 MED ORDER — FUROSEMIDE 20 MG PO TABS
ORAL_TABLET | Freq: Every day | ORAL | 0 refills | Status: DC
  Filled 2022-02-18: qty 90, 90d supply, fill #0

## 2022-02-18 MED ORDER — AMLODIPINE BESYLATE 2.5 MG PO TABS
ORAL_TABLET | Freq: Every day | ORAL | 1 refills | Status: DC
  Filled 2022-02-18: qty 90, 90d supply, fill #0
  Filled 2022-05-16: qty 90, 90d supply, fill #1

## 2022-02-20 ENCOUNTER — Other Ambulatory Visit: Payer: Self-pay

## 2022-02-20 MED ORDER — SEMAGLUTIDE-WEIGHT MANAGEMENT 2.4 MG/0.75ML ~~LOC~~ SOAJ
2.4000 mg | SUBCUTANEOUS | 2 refills | Status: DC
  Filled 2022-02-20: qty 3, 28d supply, fill #0
  Filled 2022-03-31: qty 3, 28d supply, fill #1
  Filled 2022-04-27 – 2022-05-02 (×4): qty 3, 28d supply, fill #2

## 2022-03-14 ENCOUNTER — Other Ambulatory Visit: Payer: Self-pay

## 2022-03-24 ENCOUNTER — Other Ambulatory Visit: Payer: Self-pay | Admitting: Internal Medicine

## 2022-03-24 ENCOUNTER — Encounter: Payer: Self-pay | Admitting: Internal Medicine

## 2022-03-24 DIAGNOSIS — F339 Major depressive disorder, recurrent, unspecified: Secondary | ICD-10-CM

## 2022-03-24 DIAGNOSIS — F419 Anxiety disorder, unspecified: Secondary | ICD-10-CM

## 2022-03-25 ENCOUNTER — Other Ambulatory Visit: Payer: Self-pay

## 2022-03-25 MED ORDER — CITALOPRAM HYDROBROMIDE 10 MG PO TABS
ORAL_TABLET | Freq: Every day | ORAL | 3 refills | Status: DC
  Filled 2022-03-25: qty 90, 90d supply, fill #0
  Filled 2022-06-23: qty 90, 90d supply, fill #1
  Filled 2022-10-12: qty 90, 90d supply, fill #2
  Filled 2023-01-13: qty 90, 90d supply, fill #3

## 2022-03-26 MED ORDER — GABAPENTIN 300 MG PO CAPS
300.0000 mg | ORAL_CAPSULE | Freq: Every day | ORAL | 1 refills | Status: DC
  Filled 2022-03-26: qty 90, 90d supply, fill #0
  Filled 2022-06-28: qty 90, 90d supply, fill #1

## 2022-03-27 ENCOUNTER — Other Ambulatory Visit: Payer: Self-pay

## 2022-04-01 ENCOUNTER — Other Ambulatory Visit: Payer: Self-pay

## 2022-04-04 ENCOUNTER — Ambulatory Visit: Payer: 59 | Admitting: Dermatology

## 2022-04-04 ENCOUNTER — Encounter: Payer: Self-pay | Admitting: Dermatology

## 2022-04-04 DIAGNOSIS — D2271 Melanocytic nevi of right lower limb, including hip: Secondary | ICD-10-CM

## 2022-04-04 DIAGNOSIS — L905 Scar conditions and fibrosis of skin: Secondary | ICD-10-CM | POA: Diagnosis not present

## 2022-04-04 DIAGNOSIS — D485 Neoplasm of uncertain behavior of skin: Secondary | ICD-10-CM

## 2022-04-04 DIAGNOSIS — L988 Other specified disorders of the skin and subcutaneous tissue: Secondary | ICD-10-CM

## 2022-04-04 NOTE — Patient Instructions (Signed)

## 2022-04-04 NOTE — Progress Notes (Signed)
   Follow-Up Visit   Subjective  Autumn Martinez is a 46 y.o. female who presents for the following: Facial Elastosis (Botox today) and Follow-up (Recheck nevus of right post calf).  The following portions of the chart were reviewed this encounter and updated as appropriate:   Tobacco  Allergies  Meds  Problems  Med Hx  Surg Hx  Fam Hx     Review of Systems:  No other skin or systemic complaints except as noted in HPI or Assessment and Plan.  Objective  Well appearing patient in no apparent distress; mood and affect are within normal limits.  A focused examination was performed including face, right leg. Relevant physical exam findings are noted in the Assessment and Plan.  Right post calf 0.3 cm irregular brown macule  Head - Anterior (Face) Rhytides and volume loss.       Assessment & Plan  Neoplasm of uncertain behavior of skin Right post calf Epidermal / dermal shaving  Lesion diameter (cm):  0.3 Informed consent: discussed and consent obtained   Timeout: patient name, date of birth, surgical site, and procedure verified   Procedure prep:  Patient was prepped and draped in usual sterile fashion Prep type:  Isopropyl alcohol Anesthesia: the lesion was anesthetized in a standard fashion   Anesthetic:  1% lidocaine w/ epinephrine 1-100,000 buffered w/ 8.4% NaHCO3 Instrument used: flexible razor blade   Hemostasis achieved with: pressure, aluminum chloride and electrodesiccation   Outcome: patient tolerated procedure well   Post-procedure details: sterile dressing applied and wound care instructions given   Dressing type: bandage and petrolatum    Specimen 1 - Surgical pathology Differential Diagnosis: Nevus vs dysplastic nevus Check Margins: No  Elastosis of skin Head - Anterior (Face) Botox today to forehead - 10 units total  Botox Injection - Head - Anterior (Face) Location: See attached image  Informed consent: Discussed risks (infection, pain,  bleeding, bruising, swelling, allergic reaction, paralysis of nearby muscles, eyelid droop, double vision, neck weakness, difficulty breathing, headache, undesirable cosmetic result, and need for additional treatment) and benefits of the procedure, as well as the alternatives.  Informed consent was obtained.  Preparation: The area was cleansed with alcohol.  Procedure Details:  Botox was injected into the dermis with a 30-gauge needle. Pressure applied to any bleeding. Ice packs offered for swelling.  Lot Number:  [id number] Expiration:  02/2024  Total Units Injected:  10  Plan: Patient was instructed to remain upright for 4 hours. Patient was instructed to avoid massaging the face and avoid vigorous exercise for the rest of the day. Tylenol may be used for headache.  Allow 2 weeks before returning to clinic for additional dosing as needed. Patient will call for any problems.  Return for Botox in 3-4 months.  I, Ashok Cordia, CMA, am acting as scribe for Sarina Ser, MD . Documentation: I have reviewed the above documentation for accuracy and completeness, and I agree with the above.  Sarina Ser, MD

## 2022-04-08 ENCOUNTER — Encounter: Payer: Self-pay | Admitting: Dermatology

## 2022-04-09 ENCOUNTER — Telehealth: Payer: Self-pay

## 2022-04-09 NOTE — Telephone Encounter (Signed)
-----   Message from Ralene Bathe, MD sent at 04/05/2022  8:01 PM EDT ----- Diagnosis Skin , right post calf MELANOCYTIC NEVUS WITH SCAR, BASE INVOLVED, SEE DESCRIPTION  Benign mole No further treatment needed

## 2022-04-09 NOTE — Telephone Encounter (Signed)
Left message to advise patient of results/hd

## 2022-04-27 ENCOUNTER — Other Ambulatory Visit: Payer: Self-pay | Admitting: Internal Medicine

## 2022-04-28 ENCOUNTER — Other Ambulatory Visit: Payer: Self-pay

## 2022-04-28 MED ORDER — FUROSEMIDE 20 MG PO TABS
ORAL_TABLET | Freq: Every day | ORAL | 0 refills | Status: DC
  Filled 2022-04-28: qty 90, 90d supply, fill #0

## 2022-04-29 ENCOUNTER — Other Ambulatory Visit: Payer: Self-pay

## 2022-04-30 ENCOUNTER — Encounter: Payer: Self-pay | Admitting: Internal Medicine

## 2022-04-30 ENCOUNTER — Other Ambulatory Visit: Payer: Self-pay

## 2022-05-01 ENCOUNTER — Other Ambulatory Visit: Payer: Self-pay

## 2022-05-02 ENCOUNTER — Other Ambulatory Visit: Payer: Self-pay

## 2022-05-08 ENCOUNTER — Other Ambulatory Visit: Payer: Self-pay

## 2022-05-09 ENCOUNTER — Encounter: Payer: Self-pay | Admitting: Internal Medicine

## 2022-05-10 ENCOUNTER — Other Ambulatory Visit: Payer: Self-pay

## 2022-05-10 MED ORDER — CLINDAMYCIN PHOS-BENZOYL PEROX 1-5 % EX GEL
CUTANEOUS | 5 refills | Status: AC
  Filled 2022-05-10 – 2022-05-13 (×2): qty 50, 30d supply, fill #0
  Filled 2022-05-16: qty 50, fill #0
  Filled 2022-05-17: qty 50, 30d supply, fill #0
  Filled 2022-09-15: qty 50, 30d supply, fill #1
  Filled 2022-12-29: qty 50, 30d supply, fill #2

## 2022-05-13 ENCOUNTER — Telehealth: Payer: 59 | Admitting: Physician Assistant

## 2022-05-13 ENCOUNTER — Other Ambulatory Visit: Payer: Self-pay

## 2022-05-13 DIAGNOSIS — R3989 Other symptoms and signs involving the genitourinary system: Secondary | ICD-10-CM | POA: Diagnosis not present

## 2022-05-13 MED ORDER — CEPHALEXIN 500 MG PO CAPS
500.0000 mg | ORAL_CAPSULE | Freq: Two times a day (BID) | ORAL | 0 refills | Status: DC
  Filled 2022-05-13: qty 14, 7d supply, fill #0

## 2022-05-13 NOTE — Progress Notes (Signed)

## 2022-05-17 ENCOUNTER — Other Ambulatory Visit: Payer: Self-pay

## 2022-05-25 ENCOUNTER — Encounter: Payer: Self-pay | Admitting: Dermatology

## 2022-05-25 ENCOUNTER — Other Ambulatory Visit: Payer: Self-pay | Admitting: Internal Medicine

## 2022-05-26 ENCOUNTER — Other Ambulatory Visit: Payer: Self-pay

## 2022-05-26 MED ORDER — WEGOVY 2.4 MG/0.75ML ~~LOC~~ SOAJ
2.4000 mg | SUBCUTANEOUS | 2 refills | Status: DC
  Filled 2022-05-26: qty 3, 28d supply, fill #0
  Filled 2022-06-22: qty 3, 28d supply, fill #1
  Filled 2022-07-20: qty 3, 28d supply, fill #2

## 2022-05-27 ENCOUNTER — Other Ambulatory Visit: Payer: Self-pay

## 2022-05-31 ENCOUNTER — Encounter: Payer: Self-pay | Admitting: Internal Medicine

## 2022-05-31 DIAGNOSIS — N951 Menopausal and female climacteric states: Secondary | ICD-10-CM

## 2022-05-31 NOTE — Telephone Encounter (Signed)
Referral pended for approval

## 2022-06-06 ENCOUNTER — Other Ambulatory Visit: Payer: Self-pay | Admitting: Internal Medicine

## 2022-06-06 ENCOUNTER — Other Ambulatory Visit: Payer: Self-pay

## 2022-06-06 MED ORDER — LOSARTAN POTASSIUM 100 MG PO TABS
ORAL_TABLET | Freq: Every day | ORAL | 3 refills | Status: DC
  Filled 2022-06-06: qty 90, 90d supply, fill #0
  Filled 2022-09-11: qty 90, 90d supply, fill #1
  Filled 2022-12-08: qty 90, 90d supply, fill #2
  Filled 2023-03-06: qty 90, 90d supply, fill #3

## 2022-06-06 MED ORDER — AMLODIPINE BESYLATE 2.5 MG PO TABS
ORAL_TABLET | Freq: Every day | ORAL | 1 refills | Status: DC
  Filled 2022-06-06: qty 90, fill #0
  Filled 2022-08-18: qty 90, 90d supply, fill #0

## 2022-06-13 ENCOUNTER — Other Ambulatory Visit: Payer: Self-pay

## 2022-06-13 DIAGNOSIS — R5383 Other fatigue: Secondary | ICD-10-CM | POA: Diagnosis not present

## 2022-06-13 DIAGNOSIS — R6882 Decreased libido: Secondary | ICD-10-CM | POA: Diagnosis not present

## 2022-06-13 DIAGNOSIS — N920 Excessive and frequent menstruation with regular cycle: Secondary | ICD-10-CM | POA: Diagnosis not present

## 2022-06-18 ENCOUNTER — Other Ambulatory Visit: Payer: Self-pay

## 2022-06-20 ENCOUNTER — Ambulatory Visit
Admission: RE | Admit: 2022-06-20 | Discharge: 2022-06-20 | Disposition: A | Discharge status: Home / Self Care | Payer: 59 | Source: Ambulatory Visit | Attending: Internal Medicine | Admitting: Internal Medicine

## 2022-06-20 ENCOUNTER — Other Ambulatory Visit: Payer: Self-pay | Admitting: Internal Medicine

## 2022-06-20 DIAGNOSIS — R928 Other abnormal and inconclusive findings on diagnostic imaging of breast: Secondary | ICD-10-CM | POA: Diagnosis not present

## 2022-06-20 DIAGNOSIS — N6489 Other specified disorders of breast: Secondary | ICD-10-CM

## 2022-06-23 ENCOUNTER — Other Ambulatory Visit: Payer: Self-pay

## 2022-06-24 ENCOUNTER — Other Ambulatory Visit: Payer: Self-pay

## 2022-06-28 ENCOUNTER — Other Ambulatory Visit: Payer: Self-pay

## 2022-07-07 ENCOUNTER — Other Ambulatory Visit: Payer: Self-pay | Admitting: Internal Medicine

## 2022-07-07 ENCOUNTER — Other Ambulatory Visit: Payer: Self-pay

## 2022-07-08 ENCOUNTER — Other Ambulatory Visit: Payer: Self-pay

## 2022-07-11 ENCOUNTER — Other Ambulatory Visit: Payer: Self-pay | Admitting: Internal Medicine

## 2022-07-11 ENCOUNTER — Other Ambulatory Visit: Payer: Self-pay

## 2022-07-11 MED ORDER — BUSPIRONE HCL 10 MG PO TABS
10.0000 mg | ORAL_TABLET | Freq: Two times a day (BID) | ORAL | 1 refills | Status: DC
  Filled 2022-07-11: qty 60, 30d supply, fill #0
  Filled 2022-10-12: qty 60, 30d supply, fill #1

## 2022-07-21 ENCOUNTER — Other Ambulatory Visit: Payer: Self-pay

## 2022-07-22 ENCOUNTER — Other Ambulatory Visit: Payer: Self-pay

## 2022-08-08 ENCOUNTER — Other Ambulatory Visit: Payer: Self-pay

## 2022-08-08 ENCOUNTER — Ambulatory Visit: Payer: 59 | Admitting: Dermatology

## 2022-08-08 ENCOUNTER — Other Ambulatory Visit: Payer: Self-pay | Admitting: Internal Medicine

## 2022-08-08 ENCOUNTER — Encounter: Payer: Self-pay | Admitting: Internal Medicine

## 2022-08-08 DIAGNOSIS — U071 COVID-19: Secondary | ICD-10-CM | POA: Insufficient documentation

## 2022-08-08 HISTORY — DX: COVID-19: U07.1

## 2022-08-08 MED ORDER — HYDROCOD POLI-CHLORPHE POLI ER 10-8 MG/5ML PO SUER: 5.0000 mL | Freq: Every evening | ORAL | 0 refills | Status: DC | PRN

## 2022-08-08 MED ORDER — HYDROCOD POLI-CHLORPHE POLI ER 10-8 MG/5ML PO SUER
5.0000 mL | Freq: Every evening | ORAL | 0 refills | Status: DC | PRN
  Filled 2022-08-08: qty 115, 23d supply, fill #0

## 2022-08-08 MED ORDER — AMOXICILLIN-POT CLAVULANATE 875-125 MG PO TABS
1.0000 | ORAL_TABLET | Freq: Two times a day (BID) | ORAL | 0 refills | Status: DC
  Filled 2022-08-08: qty 14, 7d supply, fill #0

## 2022-08-08 MED ORDER — MOLNUPIRAVIR EUA 200MG CAPSULE
4.0000 | ORAL_CAPSULE | Freq: Two times a day (BID) | ORAL | 0 refills | Status: AC
  Filled 2022-08-08: qty 40, 5d supply, fill #0

## 2022-08-08 MED ORDER — PREDNISONE 10 MG PO TABS
ORAL_TABLET | ORAL | 0 refills | Status: AC
  Filled 2022-08-08: qty 21, 6d supply, fill #0

## 2022-08-10 ENCOUNTER — Other Ambulatory Visit: Payer: Self-pay | Admitting: Family

## 2022-08-14 ENCOUNTER — Other Ambulatory Visit: Payer: Self-pay | Admitting: Family

## 2022-08-14 ENCOUNTER — Other Ambulatory Visit: Payer: Self-pay

## 2022-08-15 ENCOUNTER — Other Ambulatory Visit: Payer: Self-pay

## 2022-08-18 ENCOUNTER — Other Ambulatory Visit: Payer: Self-pay

## 2022-08-18 ENCOUNTER — Other Ambulatory Visit: Payer: Self-pay | Admitting: Family

## 2022-08-18 ENCOUNTER — Encounter: Payer: Self-pay | Admitting: Internal Medicine

## 2022-08-19 ENCOUNTER — Other Ambulatory Visit: Payer: Self-pay

## 2022-08-20 ENCOUNTER — Other Ambulatory Visit: Payer: Self-pay

## 2022-08-20 ENCOUNTER — Other Ambulatory Visit: Payer: Self-pay | Admitting: Internal Medicine

## 2022-08-20 MED ORDER — AMLODIPINE BESYLATE 2.5 MG PO TABS
ORAL_TABLET | Freq: Every day | ORAL | 1 refills | Status: DC
  Filled 2022-08-20: qty 90, 90d supply, fill #0
  Filled 2022-11-13: qty 90, 90d supply, fill #1

## 2022-08-20 MED FILL — Semaglutide (Weight Mngmt) Soln Auto-Injector 2.4 MG/0.75ML: SUBCUTANEOUS | 28 days supply | Qty: 3 | Fill #0 | Status: AC

## 2022-08-20 MED FILL — Furosemide Tab 20 MG: ORAL | 90 days supply | Qty: 90 | Fill #0 | Status: AC

## 2022-08-20 NOTE — Telephone Encounter (Signed)
Medications have been refilled ?

## 2022-08-22 ENCOUNTER — Ambulatory Visit (INDEPENDENT_AMBULATORY_CARE_PROVIDER_SITE_OTHER): Payer: Self-pay | Admitting: Dermatology

## 2022-08-22 ENCOUNTER — Encounter: Payer: Self-pay | Admitting: Dermatology

## 2022-08-22 DIAGNOSIS — L988 Other specified disorders of the skin and subcutaneous tissue: Secondary | ICD-10-CM

## 2022-08-22 NOTE — Patient Instructions (Signed)
Due to recent changes in healthcare laws, you may see results of your pathology and/or laboratory studies on MyChart before the doctors have had a chance to review them. We understand that in some cases there may be results that are confusing or concerning to you. Please understand that not all results are received at the same time and often the doctors may need to interpret multiple results in order to provide you with the best plan of care or course of treatment. Therefore, we ask that you please give us 2 business days to thoroughly review all your results before contacting the office for clarification. Should we see a critical lab result, you will be contacted sooner.   If You Need Anything After Your Visit  If you have any questions or concerns for your doctor, please call our main line at [phone] and press option 4 to reach your doctor's medical assistant. If no one answers, please leave a voicemail as directed and we will return your call as soon as possible. Messages left after 4 pm will be answered the following business day.   You may also send us a message via MyChart. We typically respond to MyChart messages within 1-2 business days.  For prescription refills, please ask your pharmacy to contact our office. Our fax number is [phone].  If you have an urgent issue when the clinic is closed that cannot wait until the next business day, you can page your doctor at the number below.    Please note that while we do our best to be available for urgent issues outside of office hours, we are not available 24/7.   If you have an urgent issue and are unable to reach us, you may choose to seek medical care at your doctor's office, retail clinic, urgent care center, or emergency room.  If you have a medical emergency, please immediately call 911 or go to the emergency department.  Pager Numbers  - Dr. Kowalski: [phone]  - Dr. Moye: [phone]  - Dr. Stewart:  [phone]  In the event of inclement weather, please call our main line at [phone] for an update on the status of any delays or closures.  Dermatology Medication Tips: Please keep the boxes that topical medications come in in order to help keep track of the instructions about where and how to use these. Pharmacies typically print the medication instructions only on the boxes and not directly on the medication tubes.   If your medication is too expensive, please contact our office at [phone] option 4 or send us a message through MyChart.   We are unable to tell what your co-pay for medications will be in advance as this is different depending on your insurance coverage. However, we may be able to find a substitute medication at lower cost or fill out paperwork to get insurance to cover a needed medication.   If a prior authorization is required to get your medication covered by your insurance company, please allow us 1-2 business days to complete this process.  Drug prices often vary depending on where the prescription is filled and some pharmacies may offer cheaper prices.  The website www.goodrx.com contains coupons for medications through different pharmacies. The prices here do not account for what the cost may be with help from insurance (it may be cheaper with your insurance), but the website can give you the price if you did not use any insurance.  - You can print the associated coupon and take it with   your prescription to the pharmacy.  - You may also stop by our office during regular business hours and pick up a GoodRx coupon card.  - If you need your prescription sent electronically to a different pharmacy, notify our office through [place] MyChart or by phone at [phone] option 4.     Si Usted Necesita Algo Despus de Su Visita  Tambin puede enviarnos un mensaje a travs de MyChart. Por lo general respondemos a los mensajes de MyChart en el transcurso de 1 a 2  das hbiles.  Para renovar recetas, por favor pida a su farmacia que se ponga en contacto con nuestra oficina. Nuestro nmero de fax es el [phone].  Si tiene un asunto urgente cuando la clnica est cerrada y que no puede esperar hasta el siguiente da hbil, puede llamar/localizar a su doctor(a) al nmero que aparece a continuacin.   Por favor, tenga en cuenta que aunque hacemos todo lo posible para estar disponibles para asuntos urgentes fuera del horario de oficina, no estamos disponibles las 24 horas del da, los 7 das de la semana.   Si tiene un problema urgente y no puede comunicarse con nosotros, puede optar por buscar atencin mdica  en el consultorio de su doctor(a), en una clnica privada, en un centro de atencin urgente o en una sala de emergencias.  Si tiene una emergencia mdica, por favor llame inmediatamente al 911 o vaya a la sala de emergencias.  Nmeros de bper  - Dr. Kowalski: [phone]  - Dra. Moye: [phone]  - Dra. Stewart: [phone]  En caso de inclemencias del tiempo, por favor llame a nuestra lnea principal al [phone] para una actualizacin sobre el estado de cualquier retraso o cierre.  Consejos para la medicacin en dermatologa: Por favor, guarde las cajas en las que vienen los medicamentos de uso tpico para ayudarle a seguir las instrucciones sobre dnde y cmo usarlos. Las farmacias generalmente imprimen las instrucciones del medicamento slo en las cajas y no directamente en los tubos del medicamento.   Si su medicamento es muy caro, por favor, pngase en contacto con nuestra oficina llamando al [phone] y presione la opcin 4 o envenos un mensaje a travs de MyChart.   No podemos decirle cul ser su copago por los medicamentos por adelantado ya que esto es diferente dependiendo de la cobertura de su seguro. Sin embargo, es posible que podamos encontrar un medicamento sustituto a menor costo o llenar un formulario para que el  seguro cubra el medicamento que se considera necesario.   Si se requiere una autorizacin previa para que su compaa de seguros cubra su medicamento, por favor permtanos de 1 a 2 das hbiles para completar este proceso.  Los precios de los medicamentos varan con frecuencia dependiendo del lugar de dnde se surte la receta y alguna farmacias pueden ofrecer precios ms baratos.  El sitio web www.goodrx.com tiene cupones para medicamentos de diferentes farmacias. Los precios aqu no tienen en cuenta lo que podra costar con la ayuda del seguro (puede ser ms barato con su seguro), pero el sitio web puede darle el precio si no utiliz ningn seguro.  - Puede imprimir el cupn correspondiente y llevarlo con su receta a la farmacia.  - Tambin puede pasar por nuestra oficina durante el horario de atencin regular y recoger una tarjeta de cupones de GoodRx.  - Si necesita que su receta se enve electrnicamente a una farmacia diferente, informe a nuestra oficina a travs de MyChart de [place]   o por telfono llamando al [phone] y presione la opcin 4.  

## 2022-08-22 NOTE — Progress Notes (Signed)
   Follow-Up Visit   Subjective  Autumn Martinez is a 46 y.o. female who presents for the following: Facial Elastosis (Here for Botox).  The following portions of the chart were reviewed this encounter and updated as appropriate:  Tobacco  Allergies  Meds  Problems  Med Hx  Surg Hx  Fam Hx      Review of Systems: No other skin or systemic complaints except as noted in HPI or Assessment and Plan.   Objective  Well appearing patient in no apparent distress; mood and affect are within normal limits.  A focused examination was performed including face. Relevant physical exam findings are noted in the Assessment and Plan.  face Rhytides and volume loss.    Assessment & Plan  Elastosis of skin face  Botox today to forehead - 10 units total  Botox Injection - face Location: See attached image  Informed consent: Discussed risks (infection, pain, bleeding, bruising, swelling, allergic reaction, paralysis of nearby muscles, eyelid droop, double vision, neck weakness, difficulty breathing, headache, undesirable cosmetic result, and need for additional treatment) and benefits of the procedure, as well as the alternatives.  Informed consent was obtained.  Preparation: The area was cleansed with alcohol.  Procedure Details:  Botox was injected into the dermis with a 30-gauge needle. Pressure applied to any bleeding. Ice packs offered for swelling.  Lot Number:  [id number] Expiration:  10/2024  Total Units Injected:  10  Plan: Avoid massage to the treated area. Tylenol may be used for headache.  Allow 2 weeks before returning to clinic for additional dosing as needed. Patient will call for any problems.   Return in about 3 months (around 11/22/2022) for Botox.  I, Emelia Salisbury, CMA, am acting as scribe for Forest Gleason, MD.  Documentation: I have reviewed the above documentation for accuracy and completeness, and I agree with the above.  Forest Gleason, MD

## 2022-08-22 NOTE — Progress Notes (Signed)
error 

## 2022-09-12 ENCOUNTER — Other Ambulatory Visit: Payer: Self-pay

## 2022-09-15 ENCOUNTER — Other Ambulatory Visit: Payer: Self-pay

## 2022-09-15 MED FILL — Semaglutide (Weight Mngmt) Soln Auto-Injector 2.4 MG/0.75ML: SUBCUTANEOUS | 28 days supply | Qty: 3 | Fill #1 | Status: AC

## 2022-09-16 ENCOUNTER — Other Ambulatory Visit
Admission: RE | Admit: 2022-09-16 | Discharge: 2022-09-16 | Disposition: A | Discharge status: Home / Self Care | Payer: 59 | Source: Ambulatory Visit | Attending: Internal Medicine | Admitting: Internal Medicine

## 2022-09-16 ENCOUNTER — Encounter: Payer: Self-pay | Admitting: Internal Medicine

## 2022-09-16 ENCOUNTER — Ambulatory Visit (INDEPENDENT_AMBULATORY_CARE_PROVIDER_SITE_OTHER): Payer: 59 | Admitting: Internal Medicine

## 2022-09-16 ENCOUNTER — Other Ambulatory Visit: Payer: Self-pay

## 2022-09-16 VITALS — BP 112/78 | HR 71 | Temp 97.8°F | Ht 64.0 in | Wt 145.0 lb

## 2022-09-16 DIAGNOSIS — Z0000 Encounter for general adult medical examination without abnormal findings: Secondary | ICD-10-CM

## 2022-09-16 DIAGNOSIS — F339 Major depressive disorder, recurrent, unspecified: Secondary | ICD-10-CM

## 2022-09-16 DIAGNOSIS — R5383 Other fatigue: Secondary | ICD-10-CM | POA: Diagnosis not present

## 2022-09-16 DIAGNOSIS — R7303 Prediabetes: Secondary | ICD-10-CM

## 2022-09-16 DIAGNOSIS — D500 Iron deficiency anemia secondary to blood loss (chronic): Secondary | ICD-10-CM

## 2022-09-16 DIAGNOSIS — D509 Iron deficiency anemia, unspecified: Secondary | ICD-10-CM | POA: Diagnosis not present

## 2022-09-16 LAB — CBC WITH DIFFERENTIAL/PLATELET
Abs Immature Granulocytes: 0.03 10*3/uL (ref 0.00–0.07)
Basophils Absolute: 0.1 10*3/uL (ref 0.0–0.1)
Basophils Relative: 1 %
Eosinophils Absolute: 0.2 10*3/uL (ref 0.0–0.5)
Eosinophils Relative: 3 %
HCT: 35.6 % — ABNORMAL LOW (ref 36.0–46.0)
Hemoglobin: 11.8 g/dL — ABNORMAL LOW (ref 12.0–15.0)
Immature Granulocytes: 0 %
Lymphocytes Relative: 34 %
Lymphs Abs: 2.6 10*3/uL (ref 0.7–4.0)
MCH: 30.2 pg (ref 26.0–34.0)
MCHC: 33.1 g/dL (ref 30.0–36.0)
MCV: 91 fL (ref 80.0–100.0)
Monocytes Absolute: 0.6 10*3/uL (ref 0.1–1.0)
Monocytes Relative: 7 %
Neutro Abs: 4.1 10*3/uL (ref 1.7–7.7)
Neutrophils Relative %: 55 %
Platelets: 272 10*3/uL (ref 150–400)
RBC: 3.91 MIL/uL (ref 3.87–5.11)
RDW: 13.1 % (ref 11.5–15.5)
WBC: 7.6 10*3/uL (ref 4.0–10.5)
nRBC: 0 % (ref 0.0–0.2)

## 2022-09-16 LAB — HEMOGLOBIN A1C
Hgb A1c MFr Bld: 5.3 % (ref 4.8–5.6)
Mean Plasma Glucose: 105.41 mg/dL

## 2022-09-16 LAB — IRON AND TIBC
Iron: 114 ug/dL (ref 28–170)
Saturation Ratios: 42 % — ABNORMAL HIGH (ref 10.4–31.8)
TIBC: 270 ug/dL (ref 250–450)
UIBC: 156 ug/dL

## 2022-09-16 LAB — TSH: TSH: 1.072 u[IU]/mL (ref 0.350–4.500)

## 2022-09-16 MED ORDER — SLYND 4 MG PO TABS: 1.0000 | ORAL_TABLET | Freq: Every day | ORAL | 0 refills | Status: DC

## 2022-09-16 NOTE — Assessment & Plan Note (Signed)

## 2022-09-16 NOTE — Patient Instructions (Signed)
Glad the [facility] is improving your QOL!  I have refilled your other meds  See you in 6 months

## 2022-09-16 NOTE — Assessment & Plan Note (Signed)
Symptoms are stable on celexa and improved with management of perimenopause.  No changes today

## 2022-09-16 NOTE — Progress Notes (Signed)
The patient is here for annual preventive examination and management of other chronic and acute problems.   The risk factors are reflected in the social history.   The roster of all physicians providing medical care to patient - is listed in the Snapshot section of the chart.   Activities of daily living:  The patient is 100% independent in all ADLs: dressing, toileting, feeding as well as independent mobility   Home safety : The patient has smoke detectors in the home. They wear seatbelts.  There are no unsecured firearms at home. There is no violence in the home.    There is no risks for hepatitis, STDs or HIV. There is no   history of blood transfusion. They have no travel history to infectious disease endemic areas of the world.   The patient has seen their dentist in the last six month. They have seen their eye doctor in the last year. The patinet  denies slight hearing difficulty with regard to whispered voices and some television programs.  They have deferred audiologic testing in the last year.  They do not  have excessive sun exposure. Discussed the need for sun protection: hats, long sleeves and use of sunscreen if there is significant sun exposure.    Diet: the importance of a healthy diet is discussed. They do have a healthy diet.   The benefits of regular aerobic exercise were discussed. The patient  exercises  3 to 5 days per week  for  60 minutes.    Depression screen: there are no signs or vegative symptoms of depression- irritability, change in appetite, anhedonia, sadness/tearfullness.   The following portions of the patient's history were reviewed and updated as appropriate: allergies, current medications, past family history, past medical history,  past surgical history, past social history  and problem list.   Visual acuity was not assessed per patient preference since the patient has regular follow up with an  ophthalmologist. Hearing and body mass index were assessed and  reviewed.    During the course of the visit the patient was educated and counseled about appropriate screening and preventive services including : fall prevention , diabetes screening, nutrition counseling, colorectal cancer screening, and recommended immunizations.    Chief Complaint:  1) overweight   :  goal is 135 lbs.  Running and hiking for exercise  5 days and using wegovy,   down 30 lbs.    2) perimenopause:  she has been  taking progesterone 20 days per month by GYN.  Sleep ,  focus improved  no periods in the last 2 months .  Has had a prior endometrial ultrasound . [facility]  Dr.  Murrell Redden   (menopause specialists) . Sleeping better since starting Slynd   Review of Symptoms  Patient denies headache, fevers, malaise, unintentional weight loss, skin rash, eye pain, sinus congestion and sinus pain, sore throat, dysphagia,  hemoptysis , cough, dyspnea, wheezing, chest pain, palpitations, orthopnea, edema, abdominal pain, nausea, melena, diarrhea, constipation, flank pain, dysuria, hematuria, urinary  Frequency, nocturia, numbness, tingling, seizures,  Focal weakness, Loss of consciousness,  Tremor, insomnia, depression, anxiety, and suicidal ideation.    Physical Exam:  BP 112/78 (BP Location: Left Arm, Patient Position: Sitting, Cuff Size: Normal)   Pulse 71   Temp 97.8 F (36.6 C) (Oral)   Ht '5\' 4"'$  (1.626 m)   Wt 145 lb (65.8 kg)   SpO2 97%   BMI 24.89 kg/m    General appearance: alert, cooperative and appears  stated age Ears: normal TM's and external ear canals both ears Throat: lips, mucosa, and tongue normal; teeth and gums normal Neck: no adenopathy, no carotid bruit, supple, symmetrical, trachea midline and thyroid not enlarged, symmetric, no tenderness/mass/nodules Back: symmetric, no curvature. ROM normal. No CVA tenderness. Lungs: clear to auscultation bilaterally Heart: regular rate and rhythm, S1, S2 normal, no murmur, click, rub or gallop Abdomen: soft,  non-tender; bowel sounds normal; no masses,  no organomegaly Pulses: 2+ and symmetric Skin: Skin color, texture, turgor normal. No rashes or lesions Lymph nodes: Cervical, supraclavicular, and axillary nodes normal.   Assessment and Plan:  Encounter for preventive health examination age appropriate education and counseling updated, referrals for preventative services and immunizations addressed, dietary and smoking counseling addressed, most recent labs reviewed.  I have personally reviewed and have noted:   1) the patient's medical and social history 2) The pt's use of alcohol, tobacco, and illicit drugs 3) The patient's current medications and supplements 4) Functional ability including ADL's, fall risk, home safety risk, hearing and visual impairment 5) Diet and physical activities 6) Evidence for depression or mood disorder 7) The patient's height, weight, and BMI have been recorded in the chartrhythm.     I have made referrals, and provided counseling and education based on review of the above   Depression, recurrent ([place]) Symptoms are stable on celexa and improved with management of perimenopause.  No changes today   Updated Medication List Outpatient Encounter Medications as of 09/16/2022  Medication Sig   amLODipine (NORVASC) 2.5 MG tablet TAKE 1 TABLET BY MOUTH ONCE DAILY   busPIRone (BUSPAR) 10 MG tablet Take 1 tablet (10 mg total) by mouth 2 (two) times daily.   citalopram (CELEXA) 10 MG tablet TAKE 1 TABLET (10 MG TOTAL) BY MOUTH DAILY.   clindamycin-benzoyl peroxide (BENZACLIN) gel APPLY TO THE AFFECTED AREA(S) TWO TIMES DAILY AS NEEDED   Drospirenone (SLYND) 4 MG TABS Take 1 tablet by mouth daily.   ferrous sulfate (EQL SLOW RELEASE IRON) 160 (50 Fe) MG TBCR SR tablet Take 160 mg by mouth daily.   furosemide (LASIX) 20 MG tablet TAKE 1 TABLET (20 MG TOTAL) BY MOUTH DAILY.   gabapentin (NEURONTIN) 300 MG capsule Take 1 capsule (300 mg total) by mouth daily at 2 PM.    losartan (COZAAR) 100 MG tablet TAKE 1 TABLET BY MOUTH DAILY.   metoprolol tartrate (LOPRESSOR) 100 MG tablet Take 1 tablet (100 mg total) by mouth once for 1 dose. Take 2 hours prior to your CT scan.   Multiple Vitamins-Minerals (MULTIVITAMIN WITH MINERALS) tablet Take 1 tablet by mouth daily.   omeprazole (PRILOSEC) 40 MG capsule TAKE 1 CAPSULE BY MOUTH ONCE DAILY   Semaglutide-Weight Management (WEGOVY) 2.4 MG/0.75ML SOAJ Inject 2.4 mg into the skin once a week.   [DISCONTINUED] amoxicillin-clavulanate (AUGMENTIN) 875-125 MG tablet Take 1 tablet by mouth 2 (two) times daily.   [DISCONTINUED] chlorpheniramine-HYDROcodone (TUSSIONEX) 10-8 MG/5ML Take 5 mLs by mouth at bedtime as needed for cough.   No facility-administered encounter medications on file as of 09/16/2022.

## 2022-09-16 NOTE — Assessment & Plan Note (Signed)
She remains anemic, with low iron ,  despite cessation of menses Lab Results  Component Value Date   WBC 7.6 09/16/2022   HGB 11.8 (L) 09/16/2022   HCT 35.6 (L) 09/16/2022   MCV 91.0 09/16/2022   PLT 272 09/16/2022   Lab Results  Component Value Date   IRON 114 09/16/2022   TIBC 270 09/16/2022   FERRITIN 25.3 03/27/2021

## 2022-09-19 ENCOUNTER — Encounter: Payer: Self-pay | Admitting: Internal Medicine

## 2022-10-01 ENCOUNTER — Other Ambulatory Visit: Payer: Self-pay | Admitting: Internal Medicine

## 2022-10-02 ENCOUNTER — Other Ambulatory Visit: Payer: Self-pay

## 2022-10-02 MED ORDER — GABAPENTIN 300 MG PO CAPS
300.0000 mg | ORAL_CAPSULE | Freq: Every day | ORAL | 1 refills | Status: DC
  Filled 2022-10-02: qty 90, 90d supply, fill #0
  Filled 2022-12-29: qty 90, 90d supply, fill #1

## 2022-10-12 MED FILL — Semaglutide (Weight Mngmt) Soln Auto-Injector 2.4 MG/0.75ML: SUBCUTANEOUS | 28 days supply | Qty: 3 | Fill #2 | Status: AC

## 2022-10-13 ENCOUNTER — Other Ambulatory Visit: Payer: Self-pay

## 2022-10-14 ENCOUNTER — Other Ambulatory Visit: Payer: Self-pay

## 2022-10-24 ENCOUNTER — Other Ambulatory Visit: Payer: 59

## 2022-11-03 ENCOUNTER — Encounter: Payer: Self-pay | Admitting: Internal Medicine

## 2022-11-04 ENCOUNTER — Other Ambulatory Visit: Payer: Self-pay | Admitting: Family

## 2022-11-04 ENCOUNTER — Other Ambulatory Visit: Payer: Self-pay

## 2022-11-04 MED ORDER — WEGOVY 2.4 MG/0.75ML ~~LOC~~ SOAJ
2.4000 mg | SUBCUTANEOUS | 2 refills | Status: DC
  Filled 2022-11-04: qty 3, 28d supply, fill #0
  Filled 2022-12-08: qty 3, 28d supply, fill #1
  Filled 2022-12-29: qty 3, 28d supply, fill #2

## 2022-11-07 ENCOUNTER — Other Ambulatory Visit (INDEPENDENT_AMBULATORY_CARE_PROVIDER_SITE_OTHER): Payer: 59

## 2022-11-07 DIAGNOSIS — I10 Essential (primary) hypertension: Secondary | ICD-10-CM | POA: Diagnosis not present

## 2022-11-07 DIAGNOSIS — E785 Hyperlipidemia, unspecified: Secondary | ICD-10-CM

## 2022-11-07 DIAGNOSIS — R7303 Prediabetes: Secondary | ICD-10-CM | POA: Diagnosis not present

## 2022-11-07 LAB — COMPREHENSIVE METABOLIC PANEL
ALT: 14 U/L (ref 0–35)
AST: 17 U/L (ref 0–37)
Albumin: 4.4 g/dL (ref 3.5–5.2)
Alkaline Phosphatase: 52 U/L (ref 39–117)
BUN: 15 mg/dL (ref 6–23)
CO2: 29 mEq/L (ref 19–32)
Calcium: 9.2 mg/dL (ref 8.4–10.5)
Chloride: 103 mEq/L (ref 96–112)
Creatinine, Ser: 0.81 mg/dL (ref 0.40–1.20)
GFR: 85.76 mL/min (ref >60.00)
Glucose, Bld: 91 mg/dL (ref 70–99)
Potassium: 3.8 mEq/L (ref 3.5–5.1)
Sodium: 139 mEq/L (ref 135–145)
Total Bilirubin: 0.5 mg/dL (ref 0.2–1.2)
Total Protein: 7 g/dL (ref 6.0–8.3)

## 2022-11-07 LAB — LIPID PANEL
Cholesterol: 162 mg/dL (ref 0–200)
HDL: 46.3 mg/dL (ref >39.00)
LDL Cholesterol: 96 mg/dL (ref 0–99)
NonHDL: 115.35
Total CHOL/HDL Ratio: 3
Triglycerides: 96 mg/dL (ref 0.0–149.0)
VLDL: 19.2 mg/dL (ref 0.0–40.0)

## 2022-11-07 LAB — HEMOGLOBIN A1C: Hgb A1c MFr Bld: 5.4 % (ref 4.6–6.5)

## 2022-11-07 LAB — MICROALBUMIN / CREATININE URINE RATIO
Creatinine,U: 132.6 mg/dL
Microalb Creat Ratio: 0.8 mg/g (ref 0.0–30.0)
Microalb, Ur: 1 mg/dL (ref 0.0–1.9)

## 2022-11-13 ENCOUNTER — Other Ambulatory Visit: Payer: Self-pay | Admitting: Internal Medicine

## 2022-11-13 ENCOUNTER — Other Ambulatory Visit: Payer: Self-pay

## 2022-11-13 MED ORDER — FUROSEMIDE 20 MG PO TABS
ORAL_TABLET | Freq: Every day | ORAL | 0 refills | Status: DC
  Filled 2022-11-13: qty 90, 90d supply, fill #0

## 2022-11-14 DIAGNOSIS — Z3041 Encounter for surveillance of contraceptive pills: Secondary | ICD-10-CM | POA: Diagnosis not present

## 2022-11-14 DIAGNOSIS — N920 Excessive and frequent menstruation with regular cycle: Secondary | ICD-10-CM | POA: Diagnosis not present

## 2022-12-17 ENCOUNTER — Encounter: Payer: Self-pay | Admitting: Internal Medicine

## 2022-12-19 ENCOUNTER — Other Ambulatory Visit: Payer: Self-pay

## 2022-12-19 MED ORDER — PREDNISONE 10 MG PO TABS
ORAL_TABLET | ORAL | 0 refills | Status: AC
  Filled 2022-12-19: qty 21, 6d supply, fill #0

## 2022-12-19 NOTE — Addendum Note (Signed)
Addended by: Crecencio Mc on: 12/19/2022 01:36 PM   Modules accepted: Orders

## 2022-12-29 ENCOUNTER — Encounter: Payer: Self-pay | Admitting: Internal Medicine

## 2022-12-29 ENCOUNTER — Other Ambulatory Visit: Payer: Self-pay

## 2022-12-30 ENCOUNTER — Other Ambulatory Visit: Payer: Self-pay

## 2022-12-30 MED ORDER — WEGOVY 2.4 MG/0.75ML ~~LOC~~ SOAJ
2.4000 mg | SUBCUTANEOUS | 2 refills | Status: DC
  Filled 2022-12-30: qty 3, 28d supply, fill #0
  Filled 2023-01-27: qty 3, 28d supply, fill #1
  Filled 2023-03-01: qty 3, 28d supply, fill #2

## 2023-01-02 ENCOUNTER — Ambulatory Visit
Admission: RE | Admit: 2023-01-02 | Discharge: 2023-01-02 | Disposition: A | Discharge status: Home / Self Care | Payer: Commercial Managed Care - PPO | Source: Ambulatory Visit | Attending: Internal Medicine | Admitting: Internal Medicine

## 2023-01-02 ENCOUNTER — Other Ambulatory Visit: Payer: Self-pay | Admitting: Internal Medicine

## 2023-01-02 DIAGNOSIS — N6489 Other specified disorders of breast: Secondary | ICD-10-CM

## 2023-01-02 DIAGNOSIS — R928 Other abnormal and inconclusive findings on diagnostic imaging of breast: Secondary | ICD-10-CM | POA: Diagnosis not present

## 2023-01-09 ENCOUNTER — Ambulatory Visit (INDEPENDENT_AMBULATORY_CARE_PROVIDER_SITE_OTHER): Payer: Commercial Managed Care - PPO | Admitting: Dermatology

## 2023-01-09 VITALS — BP 107/76 | HR 75

## 2023-01-09 DIAGNOSIS — L821 Other seborrheic keratosis: Secondary | ICD-10-CM

## 2023-01-09 DIAGNOSIS — Z1283 Encounter for screening for malignant neoplasm of skin: Secondary | ICD-10-CM | POA: Diagnosis not present

## 2023-01-09 DIAGNOSIS — L905 Scar conditions and fibrosis of skin: Secondary | ICD-10-CM

## 2023-01-09 DIAGNOSIS — L814 Other melanin hyperpigmentation: Secondary | ICD-10-CM

## 2023-01-09 DIAGNOSIS — D229 Melanocytic nevi, unspecified: Secondary | ICD-10-CM

## 2023-01-09 DIAGNOSIS — L988 Other specified disorders of the skin and subcutaneous tissue: Secondary | ICD-10-CM

## 2023-01-09 NOTE — Patient Instructions (Addendum)
Recommend taking Heliocare sun protection supplement daily in sunny weather for additional sun protection. For maximum protection on the sunniest days, you can take up to 2 capsules of regular Heliocare OR take 1 capsule of Heliocare Ultra. For prolonged exposure (such as a full day in the sun), you can repeat your dose of the supplement 4 hours after your first dose. Heliocare can be purchased at [facility], at some [facility] or at [email or web address].    Melanoma ABCDEs  Melanoma is the most dangerous type of skin cancer, and is the leading cause of death from skin disease.  You are more likely to develop melanoma if you: Have light-colored skin, light-colored eyes, or red or blond hair Spend a lot of time in the sun Tan regularly, either outdoors or in a tanning bed Have had blistering sunburns, especially during childhood Have a close family member who has had a melanoma Have atypical moles or large birthmarks  Early detection of melanoma is key since treatment is typically straightforward and cure rates are extremely high if we catch it early.   The first sign of melanoma is often a change in a mole or a new dark spot.  The ABCDE system is a way of remembering the signs of melanoma.  A for asymmetry:  The two halves do not match. B for border:  The edges of the growth are irregular. C for color:  A mixture of colors are present instead of an even brown color. D for diameter:  Melanomas are usually (but not always) greater than 6mm - the size of a pencil eraser. E for evolution:  The spot keeps changing in size, shape, and color.  Please check your skin once per month between visits. You can use a small mirror in front and a large mirror behind you to keep an eye on the back side or your body.   If you see any new or changing lesions before your next follow-up, please call to schedule a visit.  Please continue daily skin protection including broad spectrum sunscreen SPF 30+ to  sun-exposed areas, reapplying every 2 hours as needed when you're outdoors.    Due to recent changes in healthcare laws, you may see results of your pathology and/or laboratory studies on MyChart before the doctors have had a chance to review them. We understand that in some cases there may be results that are confusing or concerning to you. Please understand that not all results are received at the same time and often the doctors may need to interpret multiple results in order to provide you with the best plan of care or course of treatment. Therefore, we ask that you please give us 2 business days to thoroughly review all your results before contacting the office for clarification. Should we see a critical lab result, you will be contacted sooner.   If You Need Anything After Your Visit  If you have any questions or concerns for your doctor, please call our main line at [phone] and press option 4 to reach your doctor's medical assistant. If no one answers, please leave a voicemail as directed and we will return your call as soon as possible. Messages left after 4 pm will be answered the following business day.   You may also send us a message via MyChart. We typically respond to MyChart messages within 1-2 business days.  For prescription refills, please ask your pharmacy to contact our office. Our fax number is [phone].  If you have   an urgent issue when the clinic is closed that cannot wait until the next business day, you can page your doctor at the number below.    Please note that while we do our best to be available for urgent issues outside of office hours, we are not available 24/7.   If you have an urgent issue and are unable to reach us, you may choose to seek medical care at your doctor's office, retail clinic, urgent care center, or emergency room.  If you have a medical emergency, please immediately call 911 or go to the emergency department.  Pager Numbers  - Dr.  Kowalski: [phone]  - Dr. Moye: [phone]  - Dr. Stewart: [phone]  In the event of inclement weather, please call our main line at [phone] for an update on the status of any delays or closures.  Dermatology Medication Tips: Please keep the boxes that topical medications come in in order to help keep track of the instructions about where and how to use these. Pharmacies typically print the medication instructions only on the boxes and not directly on the medication tubes.   If your medication is too expensive, please contact our office at [phone] option 4 or send us a message through MyChart.   We are unable to tell what your co-pay for medications will be in advance as this is different depending on your insurance coverage. However, we may be able to find a substitute medication at lower cost or fill out paperwork to get insurance to cover a needed medication.   If a prior authorization is required to get your medication covered by your insurance company, please allow us 1-2 business days to complete this process.  Drug prices often vary depending on where the prescription is filled and some pharmacies may offer cheaper prices.  The website www.goodrx.com contains coupons for medications through different pharmacies. The prices here do not account for what the cost may be with help from insurance (it may be cheaper with your insurance), but the website can give you the price if you did not use any insurance.  - You can print the associated coupon and take it with your prescription to the pharmacy.  - You may also stop by our office during regular business hours and pick up a GoodRx coupon card.  - If you need your prescription sent electronically to a different pharmacy, notify our office through [place] MyChart or by phone at [phone] option 4.     Si Usted Necesita Algo Despus de Su Visita  Tambin puede enviarnos un mensaje a travs de MyChart. Por lo  general respondemos a los mensajes de MyChart en el transcurso de 1 a 2 das hbiles.  Para renovar recetas, por favor pida a su farmacia que se ponga en contacto con nuestra oficina. Nuestro nmero de fax es el [phone].  Si tiene un asunto urgente cuando la clnica est cerrada y que no puede esperar hasta el siguiente da hbil, puede llamar/localizar a su doctor(a) al nmero que aparece a continuacin.   Por favor, tenga en cuenta que aunque hacemos todo lo posible para estar disponibles para asuntos urgentes fuera del horario de oficina, no estamos disponibles las 24 horas del da, los 7 das de la semana.   Si tiene un problema urgente y no puede comunicarse con nosotros, puede optar por buscar atencin mdica  en el consultorio de su doctor(a), en una clnica privada, en un centro de atencin urgente o en una sala de   emergencias.  Si tiene una emergencia mdica, por favor llame inmediatamente al 911 o vaya a la sala de emergencias.  Nmeros de bper  - Dr. Kowalski: [phone]  - Dra. Moye: [phone]  - Dra. Stewart: [phone]  En caso de inclemencias del tiempo, por favor llame a nuestra lnea principal al [phone] para una actualizacin sobre el estado de cualquier retraso o cierre.  Consejos para la medicacin en dermatologa: Por favor, guarde las cajas en las que vienen los medicamentos de uso tpico para ayudarle a seguir las instrucciones sobre dnde y cmo usarlos. Las farmacias generalmente imprimen las instrucciones del medicamento slo en las cajas y no directamente en los tubos del medicamento.   Si su medicamento es muy caro, por favor, pngase en contacto con nuestra oficina llamando al [phone] y presione la opcin 4 o envenos un mensaje a travs de MyChart.   No podemos decirle cul ser su copago por los medicamentos por adelantado ya que esto es diferente dependiendo de la cobertura de su seguro. Sin embargo, es posible que podamos encontrar un  medicamento sustituto a menor costo o llenar un formulario para que el seguro cubra el medicamento que se considera necesario.   Si se requiere una autorizacin previa para que su compaa de seguros cubra su medicamento, por favor permtanos de 1 a 2 das hbiles para completar este proceso.  Los precios de los medicamentos varan con frecuencia dependiendo del lugar de dnde se surte la receta y alguna farmacias pueden ofrecer precios ms baratos.  El sitio web www.goodrx.com tiene cupones para medicamentos de diferentes farmacias. Los precios aqu no tienen en cuenta lo que podra costar con la ayuda del seguro (puede ser ms barato con su seguro), pero el sitio web puede darle el precio si no utiliz ningn seguro.  - Puede imprimir el cupn correspondiente y llevarlo con su receta a la farmacia.  - Tambin puede pasar por nuestra oficina durante el horario de atencin regular y recoger una tarjeta de cupones de GoodRx.  - Si necesita que su receta se enve electrnicamente a una farmacia diferente, informe a nuestra oficina a travs de MyChart de [place] o por telfono llamando al [phone] y presione la opcin 4.  

## 2023-01-09 NOTE — Progress Notes (Signed)
Follow-Up Visit   Subjective  Autumn Martinez is a 47 y.o. female who presents for the following: Facial Elastosis (Patient here for 4 month botox ) and Annual Exam (1 year tbse, no hx of skin cancer. ).  The patient presents for Total-Body Skin Exam (TBSE) for skin cancer screening and mole check.  The patient has spots, moles and lesions to be evaluated, some may be new or changing and the patient has concerns that these could be cancer.   The following portions of the chart were reviewed this encounter and updated as appropriate:   Tobacco  Allergies  Meds  Problems  Med Hx  Surg Hx  Fam Hx      Review of Systems:  No other skin or systemic complaints except as noted in HPI or Assessment and Plan.  Objective  Well appearing patient in no apparent distress; mood and affect are within normal limits.  A full examination was performed including scalp, head, eyes, ears, nose, lips, neck, chest, axillae, abdomen, back, buttocks, bilateral upper extremities, bilateral lower extremities, hands, feet, fingers, toes, fingernails, and toenails. All findings within normal limits unless otherwise noted below.  face Rhytides and volume loss.      right posterior calf Dyspigmented smooth macule or patch.     Assessment & Plan  Elastosis of skin face  Filling material injection - face Location: See attached image  Informed consent: Discussed risks (infection, pain, bleeding, bruising, swelling, allergic reaction, paralysis of nearby muscles, eyelid droop, double vision, neck weakness, difficulty breathing, headache, undesirable cosmetic result, and need for additional treatment) and benefits of the procedure, as well as the alternatives.  Informed consent was obtained.  Preparation: The area was cleansed with alcohol.  Procedure Details:  Botox was injected into the dermis with a 30-gauge needle. Pressure applied to any bleeding. Ice packs offered for swelling.  Lot  Number:  [id number] Expiration:  01/2025  Total Units Injected:  10  Plan: Tylenol may be used for headache.  Allow 2 weeks before returning to clinic for additional dosing as needed. Patient will call for any problems.   Scar right posterior calf  Bx proven benign melanocytic nevus  Benign-appearing.  Observation.  Call clinic for new or changing lesions.  Recommend daily use of broad spectrum spf 30+ sunscreen to sun-exposed areas.     Lentigines - Scattered tan macules - Due to sun exposure - Benign-appearing, observe - Recommend daily broad spectrum sunscreen SPF 30+ to sun-exposed areas, reapply every 2 hours as needed. - Call for any changes  Seborrheic Keratoses - Stuck-on, waxy, tan-brown papules and/or plaques  - Benign-appearing - Discussed benign etiology and prognosis. - Observe - Call for any changes  Melanocytic Nevi - Tan-brown and/or pink-flesh-colored symmetric macules and papules - Benign appearing on exam today - Observation - Call clinic for new or changing moles - Recommend daily use of broad spectrum spf 30+ sunscreen to sun-exposed areas.   Hemangiomas - Red papules - Discussed benign nature - Observe - Call for any changes  Actinic Damage - Chronic condition, secondary to cumulative UV/sun exposure - diffuse scaly erythematous macules with underlying dyspigmentation - Recommend daily broad spectrum sunscreen SPF 30+ to sun-exposed areas, reapply every 2 hours as needed.  - Staying in the shade or wearing long sleeves, sun glasses (UVA+UVB protection) and wide brim hats (4-inch brim around the entire circumference of the hat) are also recommended for sun protection.  - Call for new or changing lesions.  Skin cancer screening performed today.  Return in about 3 months (around 04/09/2023) for Botox, 2 years for TBSE.  Graciella Belton, RMA, am acting as scribe for Forest Gleason, MD .  Documentation: I have reviewed the above documentation for  accuracy and completeness, and I agree with the above.  Forest Gleason, MD

## 2023-01-14 ENCOUNTER — Encounter: Payer: Self-pay | Admitting: Dermatology

## 2023-01-15 ENCOUNTER — Other Ambulatory Visit: Payer: Self-pay

## 2023-01-15 ENCOUNTER — Other Ambulatory Visit: Payer: Self-pay | Admitting: Family Medicine

## 2023-01-15 MED ORDER — PREDNISONE 10 MG PO TABS
ORAL_TABLET | ORAL | 0 refills | Status: DC
  Filled 2023-01-15: qty 42, 12d supply, fill #0

## 2023-01-27 ENCOUNTER — Other Ambulatory Visit: Payer: Self-pay

## 2023-02-09 ENCOUNTER — Other Ambulatory Visit: Payer: Self-pay | Admitting: Internal Medicine

## 2023-02-11 ENCOUNTER — Other Ambulatory Visit: Payer: Self-pay

## 2023-02-11 MED ORDER — AMLODIPINE BESYLATE 2.5 MG PO TABS
2.5000 mg | ORAL_TABLET | Freq: Every day | ORAL | 1 refills | Status: DC
  Filled 2023-02-11: qty 90, 90d supply, fill #0

## 2023-02-11 MED ORDER — BUSPIRONE HCL 10 MG PO TABS
10.0000 mg | ORAL_TABLET | Freq: Two times a day (BID) | ORAL | 1 refills | Status: DC
  Filled 2023-02-11: qty 60, 30d supply, fill #0
  Filled 2023-03-10: qty 60, 30d supply, fill #1

## 2023-02-17 ENCOUNTER — Other Ambulatory Visit: Payer: Self-pay

## 2023-02-17 ENCOUNTER — Other Ambulatory Visit: Payer: Self-pay | Admitting: Internal Medicine

## 2023-02-17 ENCOUNTER — Encounter: Payer: Self-pay | Admitting: Internal Medicine

## 2023-02-17 MED ORDER — FUROSEMIDE 20 MG PO TABS
20.0000 mg | ORAL_TABLET | Freq: Every day | ORAL | 0 refills | Status: DC
  Filled 2023-02-17: qty 90, 90d supply, fill #0

## 2023-02-18 ENCOUNTER — Other Ambulatory Visit: Payer: Self-pay

## 2023-02-18 MED ORDER — ALPRAZOLAM 0.5 MG PO TABS
0.5000 mg | ORAL_TABLET | Freq: Every evening | ORAL | 0 refills | Status: DC | PRN
  Filled 2023-02-18: qty 20, 20d supply, fill #0

## 2023-03-06 ENCOUNTER — Other Ambulatory Visit: Payer: Self-pay

## 2023-03-06 ENCOUNTER — Encounter: Payer: Self-pay | Admitting: Dermatology

## 2023-03-17 ENCOUNTER — Ambulatory Visit: Payer: 59 | Admitting: Internal Medicine

## 2023-03-24 ENCOUNTER — Other Ambulatory Visit: Payer: Self-pay | Admitting: Family Medicine

## 2023-03-24 ENCOUNTER — Other Ambulatory Visit: Payer: Self-pay

## 2023-03-24 MED ORDER — PREDNISONE 10 MG PO TABS
ORAL_TABLET | ORAL | 0 refills | Status: DC
  Filled 2023-03-24: qty 42, 12d supply, fill #0

## 2023-03-30 ENCOUNTER — Other Ambulatory Visit: Payer: Self-pay | Admitting: Internal Medicine

## 2023-03-31 ENCOUNTER — Other Ambulatory Visit: Payer: Self-pay

## 2023-03-31 ENCOUNTER — Other Ambulatory Visit: Payer: Self-pay | Admitting: Internal Medicine

## 2023-03-31 MED FILL — Gabapentin Cap 300 MG: ORAL | 81 days supply | Qty: 81 | Fill #0 | Status: AC

## 2023-03-31 MED FILL — Gabapentin Cap 300 MG: ORAL | 9 days supply | Qty: 9 | Fill #0 | Status: AC

## 2023-04-08 ENCOUNTER — Other Ambulatory Visit: Payer: Self-pay

## 2023-04-08 ENCOUNTER — Other Ambulatory Visit: Payer: Self-pay | Admitting: Internal Medicine

## 2023-04-08 DIAGNOSIS — F339 Major depressive disorder, recurrent, unspecified: Secondary | ICD-10-CM

## 2023-04-08 DIAGNOSIS — F419 Anxiety disorder, unspecified: Secondary | ICD-10-CM

## 2023-04-09 ENCOUNTER — Other Ambulatory Visit: Payer: Self-pay

## 2023-04-09 MED FILL — Citalopram Hydrobromide Tab 10 MG (Base Equiv): ORAL | 90 days supply | Qty: 90 | Fill #0 | Status: AC

## 2023-04-10 ENCOUNTER — Ambulatory Visit (INDEPENDENT_AMBULATORY_CARE_PROVIDER_SITE_OTHER): Payer: Self-pay | Admitting: Dermatology

## 2023-04-10 VITALS — BP 126/84 | HR 70

## 2023-04-10 DIAGNOSIS — L988 Other specified disorders of the skin and subcutaneous tissue: Secondary | ICD-10-CM

## 2023-04-10 NOTE — Patient Instructions (Addendum)
Due to recent changes in healthcare laws, you may see results of your pathology and/or laboratory studies on MyChart before the doctors have had a chance to review them. We understand that in some cases there may be results that are confusing or concerning to you. Please understand that not all results are received at the same time and often the doctors may need to interpret multiple results in order to provide you with the best plan of care or course of treatment. Therefore, we ask that you please give us 2 business days to thoroughly review all your results before contacting the office for clarification. Should we see a critical lab result, you will be contacted sooner.   If You Need Anything After Your Visit  If you have any questions or concerns for your doctor, please call our main line at [phone] and press option 4 to reach your doctor's medical assistant. If no one answers, please leave a voicemail as directed and we will return your call as soon as possible. Messages left after 4 pm will be answered the following business day.   You may also send us a message via MyChart. We typically respond to MyChart messages within 1-2 business days.  For prescription refills, please ask your pharmacy to contact our office. Our fax number is [phone].  If you have an urgent issue when the clinic is closed that cannot wait until the next business day, you can page your doctor at the number below.    Please note that while we do our best to be available for urgent issues outside of office hours, we are not available 24/7.   If you have an urgent issue and are unable to reach us, you may choose to seek medical care at your doctor's office, retail clinic, urgent care center, or emergency room.  If you have a medical emergency, please immediately call 911 or go to the emergency department.  Pager Numbers  - Dr. Kowalski: [phone]  - Dr. Moye: [phone]  - Dr. Stewart:  [phone]  In the event of inclement weather, please call our main line at [phone] for an update on the status of any delays or closures.  Dermatology Medication Tips: Please keep the boxes that topical medications come in in order to help keep track of the instructions about where and how to use these. Pharmacies typically print the medication instructions only on the boxes and not directly on the medication tubes.   If your medication is too expensive, please contact our office at [phone] option 4 or send us a message through MyChart.   We are unable to tell what your co-pay for medications will be in advance as this is different depending on your insurance coverage. However, we may be able to find a substitute medication at lower cost or fill out paperwork to get insurance to cover a needed medication.   If a prior authorization is required to get your medication covered by your insurance company, please allow us 1-2 business days to complete this process.  Drug prices often vary depending on where the prescription is filled and some pharmacies may offer cheaper prices.  The website www.goodrx.com contains coupons for medications through different pharmacies. The prices here do not account for what the cost may be with help from insurance (it may be cheaper with your insurance), but the website can give you the price if you did not use any insurance.  - You can print the associated coupon and take it with   your prescription to the pharmacy.  - You may also stop by our office during regular business hours and pick up a GoodRx coupon card.  - If you need your prescription sent electronically to a different pharmacy, notify our office through [facility] MyChart or by phone at [phone] option 4.     Si Usted Necesita Algo Despus de Su Visita  Tambin puede enviarnos un mensaje a travs de MyChart. Por lo general respondemos a los mensajes de MyChart en el transcurso de 1 a 2  das hbiles.  Para renovar recetas, por favor pida a su farmacia que se ponga en contacto con nuestra oficina. Nuestro nmero de fax es el [phone].  Si tiene un asunto urgente cuando la clnica est cerrada y que no puede esperar hasta el siguiente da hbil, puede llamar/localizar a su doctor(a) al nmero que aparece a continuacin.   Por favor, tenga en cuenta que aunque hacemos todo lo posible para estar disponibles para asuntos urgentes fuera del horario de oficina, no estamos disponibles las 24 horas del da, los 7 das de la semana.   Si tiene un problema urgente y no puede comunicarse con nosotros, puede optar por buscar atencin mdica  en el consultorio de su doctor(a), en una clnica privada, en un centro de atencin urgente o en una sala de emergencias.  Si tiene una emergencia mdica, por favor llame inmediatamente al 911 o vaya a la sala de emergencias.  Nmeros de bper  - Dr. Kowalski: [phone]  - Dra. Moye: [phone]  - Dra. Stewart: [phone]  En caso de inclemencias del tiempo, por favor llame a nuestra lnea principal al [phone] para una actualizacin sobre el estado de cualquier retraso o cierre.  Consejos para la medicacin en dermatologa: Por favor, guarde las cajas en las que vienen los medicamentos de uso tpico para ayudarle a seguir las instrucciones sobre dnde y cmo usarlos. Las farmacias generalmente imprimen las instrucciones del medicamento slo en las cajas y no directamente en los tubos del medicamento.   Si su medicamento es muy caro, por favor, pngase en contacto con nuestra oficina llamando al [phone] y presione la opcin 4 o envenos un mensaje a travs de MyChart.   No podemos decirle cul ser su copago por los medicamentos por adelantado ya que esto es diferente dependiendo de la cobertura de su seguro. Sin embargo, es posible que podamos encontrar un medicamento sustituto a menor costo o llenar un formulario para que el  seguro cubra el medicamento que se considera necesario.   Si se requiere una autorizacin previa para que su compaa de seguros cubra su medicamento, por favor permtanos de 1 a 2 das hbiles para completar este proceso.  Los precios de los medicamentos varan con frecuencia dependiendo del lugar de dnde se surte la receta y alguna farmacias pueden ofrecer precios ms baratos.  El sitio web www.goodrx.com tiene cupones para medicamentos de diferentes farmacias. Los precios aqu no tienen en cuenta lo que podra costar con la ayuda del seguro (puede ser ms barato con su seguro), pero el sitio web puede darle el precio si no utiliz ningn seguro.  - Puede imprimir el cupn correspondiente y llevarlo con su receta a la farmacia.  - Tambin puede pasar por nuestra oficina durante el horario de atencin regular y recoger una tarjeta de cupones de GoodRx.  - Si necesita que su receta se enve electrnicamente a una farmacia diferente, informe a nuestra oficina a travs de MyChart de [place]   o por telfono llamando al [phone] y presione la opcin 4.  

## 2023-04-10 NOTE — Progress Notes (Signed)
   Follow-Up Visit   Subjective  Autumn Martinez is a 47 y.o. female who presents for the following: Botox for facial elastosis  The following portions of the chart were reviewed this encounter and updated as appropriate: medications, allergies, medical history  Review of Systems:  No other skin or systemic complaints except as noted in HPI or Assessment and Plan.  Objective  Well appearing patient in no apparent distress; mood and affect are within normal limits.  A focused examination was performed of the face.  Relevant physical exam findings are noted in the Assessment and Plan.  Before botox photos if first time, Injection map photo      Assessment & Plan   Elastosis of skin   Facial Elastosis  Location: See attached image  Informed consent: Discussed risks (infection, pain, bleeding, bruising, swelling, allergic reaction, paralysis of nearby muscles, eyelid droop, double vision, neck weakness, difficulty breathing, headache, undesirable cosmetic result, and need for additional treatment) and benefits of the procedure, as well as the alternatives.  Informed consent was obtained.  Preparation: The area was cleansed with alcohol.  Procedure Details:  Botox was injected into the dermis with a 30-gauge needle. Pressure applied to any bleeding. Ice packs offered for swelling.  Lot Number:  [id number] Expiration:  03/2025  Total Units Injected:  11  Plan: Tylenol may be used for headache.  Allow 2 weeks before returning to clinic for additional dosing as needed. Patient will call for any problems.  Return for 3 month botox .  I, Asher Muir, CMA, am acting as scribe for Darden Dates, MD.   Documentation: I have reviewed the above documentation for accuracy and completeness, and I agree with the above.  Darden Dates, MD

## 2023-05-06 ENCOUNTER — Ambulatory Visit: Payer: Commercial Managed Care - PPO | Admitting: Internal Medicine

## 2023-05-06 ENCOUNTER — Other Ambulatory Visit: Payer: Self-pay

## 2023-05-06 VITALS — BP 108/62 | HR 78 | Temp 98.1°F | Ht 64.0 in | Wt 134.8 lb

## 2023-05-06 DIAGNOSIS — F339 Major depressive disorder, recurrent, unspecified: Secondary | ICD-10-CM

## 2023-05-06 DIAGNOSIS — R7303 Prediabetes: Secondary | ICD-10-CM | POA: Diagnosis not present

## 2023-05-06 MED ORDER — BUSPIRONE HCL 10 MG PO TABS
10.0000 mg | ORAL_TABLET | Freq: Two times a day (BID) | ORAL | 1 refills | Status: DC
  Filled 2023-05-06: qty 60, 30d supply, fill #0

## 2023-05-06 MED ORDER — LOSARTAN POTASSIUM 100 MG PO TABS
100.0000 mg | ORAL_TABLET | Freq: Every day | ORAL | 3 refills | Status: DC
  Filled 2023-05-06: qty 90, fill #0
  Filled 2023-05-07 – 2023-06-01 (×2): qty 90, 90d supply, fill #0
  Filled 2023-08-31: qty 90, 90d supply, fill #1
  Filled 2023-12-04: qty 90, 90d supply, fill #2
  Filled 2024-03-01: qty 90, 90d supply, fill #3

## 2023-05-06 MED ORDER — AMLODIPINE BESYLATE 2.5 MG PO TABS
2.5000 mg | ORAL_TABLET | Freq: Every day | ORAL | 1 refills | Status: DC
  Filled 2023-05-06: qty 90, 90d supply, fill #0
  Filled 2023-08-10: qty 90, 90d supply, fill #1

## 2023-05-06 MED ORDER — BUPROPION HCL ER (SR) 100 MG PO TB12
100.0000 mg | ORAL_TABLET | Freq: Two times a day (BID) | ORAL | 1 refills | Status: DC
  Filled 2023-05-06: qty 60, 30d supply, fill #0

## 2023-05-06 MED ORDER — FUROSEMIDE 20 MG PO TABS
20.0000 mg | ORAL_TABLET | Freq: Every day | ORAL | 0 refills | Status: DC
  Filled 2023-05-06: qty 90, 90d supply, fill #0

## 2023-05-06 NOTE — Progress Notes (Signed)
Subjective:  Patient ID: Autumn Martinez, female    DOB: 01/23/1976  Age: 47 y.o. MRN: [id number]  CC: The primary encounter diagnosis was Prediabetes. A diagnosis of Depression, recurrent (HCC) was also pertinent to this visit.   HPI Autumn Martinez presents for  Chief Complaint  Patient presents with   Medical Management of Chronic Issues   Overweight:  at goal no longer on wegovy. Has not taken it on a month ,  running daily for exercise; averaging 25 miles /week ,  Since stopping Wegovy her weight has been stable.   HTN:  BP has been lowered and currently takin g 100 mg losartan and amlodipine 2.5 mg daily  home readings systolic 108 at home.     Perimenopause:  seeing gyn progesterone  only no period since August . Return of PTsd depression   hormoen related   Outpatient Medications Prior to Visit  Medication Sig Dispense Refill   ALPRAZolam (XANAX) 0.5 MG tablet Take 1 tablet (0.5 mg total) by mouth at bedtime as needed for anxiety. 20 tablet 0   citalopram (CELEXA) 10 MG tablet Take 1 tablet (10 mg total) by mouth daily. 90 tablet 3   clindamycin-benzoyl peroxide (BENZACLIN) gel APPLY TO THE AFFECTED AREA(S) TWO TIMES DAILY AS NEEDED 50 g 5   Drospirenone (SLYND) 4 MG TABS Take 1 tablet by mouth daily. 20 tablet 0   ferrous sulfate (EQL SLOW RELEASE IRON) 160 (50 Fe) MG TBCR SR tablet Take 160 mg by mouth daily.     gabapentin (NEURONTIN) 300 MG capsule Take 1 capsule (300 mg total) by mouth daily at 2 PM. 90 capsule 1   Multiple Vitamins-Minerals (MULTIVITAMIN WITH MINERALS) tablet Take 1 tablet by mouth daily.     Semaglutide-Weight Management (WEGOVY) 2.4 MG/0.75ML SOAJ Inject 2.4 mg into the skin once a week. 3 mL 2   amLODipine (NORVASC) 2.5 MG tablet Take 1 tablet (2.5 mg total) by mouth daily. 90 tablet 1   busPIRone (BUSPAR) 10 MG tablet Take 1 tablet (10 mg total) by mouth 2 (two) times daily. 60 tablet 1   furosemide (LASIX) 20 MG tablet Take 1 tablet (20  mg total) by mouth daily. 90 tablet 0   losartan (COZAAR) 100 MG tablet TAKE 1 TABLET BY MOUTH DAILY. 90 tablet 3   omeprazole (PRILOSEC) 40 MG capsule TAKE 1 CAPSULE BY MOUTH ONCE DAILY 90 capsule 1   predniSONE (DELTASONE) 10 MG tablet Take 6 tabs daily for 2 days, 5 tabs daily for 2 days, 4 tabs daily for 2 days, 3 tabs daily for 2 days, 2 tabs for 2 days, 1 tab daily for 2 days then stop. 42 tablet 0   No facility-administered medications prior to visit.    Review of Systems;  Patient denies headache, fevers, malaise, unintentional weight loss, skin rash, eye pain, sinus congestion and sinus pain, sore throat, dysphagia,  hemoptysis , cough, dyspnea, wheezing, chest pain, palpitations, orthopnea, edema, abdominal pain, nausea, melena, diarrhea, constipation, flank pain, dysuria, hematuria, urinary  Frequency, nocturia, numbness, tingling, seizures,  Focal weakness, Loss of consciousness,  Tremor, insomnia, depression, anxiety, and suicidal ideation.      Objective:  BP 108/62   Pulse 78   Temp 98.1 F (36.7 C) (Oral)   Ht 5\' 4"  (1.626 m)   Wt 134 lb 12.8 oz (61.1 kg)   SpO2 97%   BMI 23.14 kg/m   BP Readings from Last 3 Encounters:  05/06/23 108/62  04/10/23 126/84  01/09/23 107/76    Wt Readings from Last 3 Encounters:  05/06/23 134 lb 12.8 oz (61.1 kg)  09/16/22 145 lb (65.8 kg)  11/15/21 166 lb 3.2 oz (75.4 kg)    Physical Exam Vitals reviewed.  Constitutional:      General: She is not in acute distress.    Appearance: Normal appearance. She is normal weight. She is not ill-appearing, toxic-appearing or diaphoretic.  HENT:     Head: Normocephalic.  Eyes:     General: No scleral icterus.       Right eye: No discharge.        Left eye: No discharge.     Conjunctiva/sclera: Conjunctivae normal.  Cardiovascular:     Rate and Rhythm: Normal rate and regular rhythm.     Heart sounds: Normal heart sounds.  Pulmonary:     Effort: Pulmonary effort is normal. No  respiratory distress.     Breath sounds: Normal breath sounds.  Musculoskeletal:        General: Normal range of motion.  Skin:    General: Skin is warm and dry.  Neurological:     General: No focal deficit present.     Mental Status: She is alert and oriented to person, place, and time. Mental status is at baseline.  Psychiatric:        Attention and Perception: Attention normal.        Mood and Affect: Mood and affect normal.        Speech: Speech normal.        Behavior: Behavior normal.        Thought Content: Thought content normal.        Cognition and Memory: Cognition normal.        Judgment: Judgment normal.    Lab Results  Component Value Date   HGBA1C 5.4 11/07/2022   HGBA1C 5.3 09/16/2022   HGBA1C 5.6 03/27/2021    Lab Results  Component Value Date   CREATININE 0.81 11/07/2022   CREATININE 0.72 07/26/2021   CREATININE 0.73 03/27/2021    Lab Results  Component Value Date   WBC 7.6 09/16/2022   HGB 11.8 (L) 09/16/2022   HCT 35.6 (L) 09/16/2022   PLT 272 09/16/2022   GLUCOSE 91 11/07/2022   CHOL 162 11/07/2022   TRIG 96.0 11/07/2022   HDL 46.30 11/07/2022   LDLCALC 96 11/07/2022   ALT 14 11/07/2022   AST 17 11/07/2022   NA 139 11/07/2022   K 3.8 11/07/2022   CL 103 11/07/2022   CREATININE 0.81 11/07/2022   BUN 15 11/07/2022   CO2 29 11/07/2022   TSH 1.072 09/16/2022   INR 1.0 08/30/2016   HGBA1C 5.4 11/07/2022   MICROALBUR 1.0 11/07/2022    MM DIAG BREAST TOMO BILATERAL  Result Date: 01/02/2023 CLINICAL DATA:  Follow-up for probably benign asymmetry within the LEFT breast. This probably benign asymmetry was initially identified on screening mammogram dated 11/08/2021. EXAM: DIGITAL DIAGNOSTIC BILATERAL MAMMOGRAM WITH TOMOSYNTHESIS TECHNIQUE: Bilateral digital diagnostic mammography and breast tomosynthesis was performed. COMPARISON:  Previous exams including screening mammogram dated 11/08/2021 and diagnostic mammogram dated 12/20/2021. ACR Breast  Density Category b: There are scattered areas of fibroglandular density. FINDINGS: On today's diagnostic exam, the previously questioned asymmetry within the far posterior LEFT breast is significantly less conspicuous and compatible with normal fibroglandular tissues. No new dominant masses, suspicious calcifications or secondary signs of malignancy elsewhere within the LEFT breast. IMPRESSION: Probably benign island of normal fibroglandular tissue within the far posterior  LEFT breast. Recommend additional LEFT breast diagnostic mammogram in 6 months to ensure continued stability. RECOMMENDATION: LEFT breast diagnostic mammogram in 6 months. I have discussed the findings and recommendations with the patient. If applicable, a reminder letter will be sent to the patient regarding the next appointment. BI-RADS CATEGORY  3: Probably benign. Electronically Signed   By: Bary Richard M.D.   On: 01/02/2023 16:26   Assessment & Plan:  .Prediabetes -     Comprehensive metabolic panel; Future -     Lipid panel; Future -     Hemoglobin A1c; Future  Depression, recurrent [facility]) Assessment & Plan: With return of negative emotions reminiscent of her PTSD .  Adding low dose wellbutrin for loss of motivation   ; 100 mg q 12 hrs, with advice to take just the morning dose to prevent worsening of insomnia ,  continue citalopram    Other orders -     amLODIPine Besylate; Take 1 tablet (2.5 mg total) by mouth daily.  Dispense: 90 tablet; Refill: 1 -     busPIRone HCl; Take 1 tablet (10 mg total) by mouth 2 (two) times daily.  Dispense: 60 tablet; Refill: 1 -     Furosemide; Take 1 tablet (20 mg total) by mouth daily.  Dispense: 90 tablet; Refill: 0 -     Losartan Potassium; Take 1 tablet (100 mg total) by mouth daily.  Dispense: 90 tablet; Refill: 3 -     buPROPion HCl ER (SR); Take 1 tablet (100 mg total) by mouth 2 (two) times daily.  Dispense: 60 tablet; Refill: 1    Follow-up: Return in about 3 months (around  08/06/2023).   Sherlene Shams, MD

## 2023-05-06 NOTE — Patient Instructions (Signed)
Continue citalopram  Adding lowest dose of wellbutrin ,  can skip the evening dose

## 2023-05-06 NOTE — Assessment & Plan Note (Signed)
With return of negative emotions reminiscent of her PTSD .  Adding low dose wellbutrin for loss of motivation   ; 100 mg q 12 hrs, with advice to take just the morning dose to prevent worsening of insomnia ,  continue citalopram

## 2023-05-07 ENCOUNTER — Other Ambulatory Visit: Payer: Self-pay

## 2023-05-27 ENCOUNTER — Encounter: Payer: Self-pay | Admitting: Internal Medicine

## 2023-05-27 ENCOUNTER — Other Ambulatory Visit: Payer: Self-pay

## 2023-05-27 MED ORDER — BUPROPION HCL ER (XL) 150 MG PO TB24
150.0000 mg | ORAL_TABLET | Freq: Every day | ORAL | 1 refills | Status: DC
  Filled 2023-05-27: qty 90, 90d supply, fill #0

## 2023-06-02 ENCOUNTER — Other Ambulatory Visit: Payer: Self-pay

## 2023-06-12 ENCOUNTER — Other Ambulatory Visit: Payer: Self-pay | Admitting: Oncology

## 2023-06-12 DIAGNOSIS — Z006 Encounter for examination for normal comparison and control in clinical research program: Secondary | ICD-10-CM

## 2023-06-22 MED FILL — Gabapentin Cap 300 MG: ORAL | 90 days supply | Qty: 90 | Fill #1 | Status: AC

## 2023-07-06 ENCOUNTER — Encounter: Payer: Self-pay | Admitting: Internal Medicine

## 2023-07-09 ENCOUNTER — Other Ambulatory Visit: Payer: Self-pay

## 2023-07-09 MED ORDER — BUPROPION HCL ER (XL) 300 MG PO TB24
300.0000 mg | ORAL_TABLET | Freq: Every day | ORAL | 1 refills | Status: DC
  Filled 2023-07-09: qty 90, 90d supply, fill #0
  Filled 2023-10-06: qty 90, 90d supply, fill #1

## 2023-07-09 MED ORDER — CLINDAMYCIN PHOS-BENZOYL PEROX 1-5 % EX GEL
Freq: Two times a day (BID) | CUTANEOUS | 2 refills | Status: DC
  Filled 2023-07-09: qty 50, 30d supply, fill #0
  Filled 2024-01-08: qty 50, 30d supply, fill #1
  Filled 2024-04-02: qty 50, 30d supply, fill #2

## 2023-07-09 MED FILL — Citalopram Hydrobromide Tab 10 MG (Base Equiv): ORAL | 90 days supply | Qty: 90 | Fill #1 | Status: AC

## 2023-07-10 ENCOUNTER — Other Ambulatory Visit: Payer: Self-pay

## 2023-07-10 ENCOUNTER — Ambulatory Visit
Admission: RE | Admit: 2023-07-10 | Discharge: 2023-07-10 | Disposition: A | Discharge status: Home / Self Care | Payer: Commercial Managed Care - PPO | Source: Ambulatory Visit | Attending: Internal Medicine | Admitting: Internal Medicine

## 2023-07-10 DIAGNOSIS — N6489 Other specified disorders of breast: Secondary | ICD-10-CM

## 2023-07-10 DIAGNOSIS — R928 Other abnormal and inconclusive findings on diagnostic imaging of breast: Secondary | ICD-10-CM | POA: Diagnosis not present

## 2023-07-11 ENCOUNTER — Other Ambulatory Visit: Payer: Self-pay | Admitting: Internal Medicine

## 2023-07-13 ENCOUNTER — Other Ambulatory Visit: Payer: Self-pay | Admitting: Internal Medicine

## 2023-07-13 DIAGNOSIS — Z1231 Encounter for screening mammogram for malignant neoplasm of breast: Secondary | ICD-10-CM

## 2023-08-10 ENCOUNTER — Other Ambulatory Visit: Payer: Self-pay | Admitting: Internal Medicine

## 2023-08-10 ENCOUNTER — Encounter: Payer: Self-pay | Admitting: Internal Medicine

## 2023-08-10 ENCOUNTER — Other Ambulatory Visit: Payer: Self-pay

## 2023-08-11 ENCOUNTER — Other Ambulatory Visit: Payer: Self-pay

## 2023-08-11 MED FILL — Furosemide Tab 20 MG: ORAL | 90 days supply | Qty: 90 | Fill #0 | Status: AC

## 2023-08-12 ENCOUNTER — Other Ambulatory Visit: Payer: Self-pay

## 2023-08-12 MED ORDER — PREDNISONE 10 MG PO TABS
ORAL_TABLET | ORAL | 0 refills | Status: AC
  Filled 2023-08-12: qty 21, 6d supply, fill #0

## 2023-09-11 ENCOUNTER — Ambulatory Visit (INDEPENDENT_AMBULATORY_CARE_PROVIDER_SITE_OTHER): Payer: Self-pay | Admitting: Dermatology

## 2023-09-11 DIAGNOSIS — L988 Other specified disorders of the skin and subcutaneous tissue: Secondary | ICD-10-CM

## 2023-09-11 NOTE — Patient Instructions (Signed)

## 2023-09-11 NOTE — Progress Notes (Signed)
   Follow-Up Visit   Subjective  Autumn Martinez is a 47 y.o. female who presents for the following: Botox for facial elastosis  The following portions of the chart were reviewed this encounter and updated as appropriate: medications, allergies, medical history  Review of Systems:  No other skin or systemic complaints except as noted in HPI or Assessment and Plan.  Objective  Well appearing patient in no apparent distress; mood and affect are within normal limits.  A focused examination was performed of the face.  Relevant physical exam findings are noted in the Assessment and Plan.      Assessment & Plan    Facial Elastosis - pt has noticed occasional brow drop, injections we're placed higher on the forehead at today's visit.   Location: See attached image  Informed consent: Discussed risks (infection, pain, bleeding, bruising, swelling, allergic reaction, paralysis of nearby muscles, eyelid droop, double vision, neck weakness, difficulty breathing, headache, undesirable cosmetic result, and need for additional treatment) and benefits of the procedure, as well as the alternatives.  Informed consent was obtained.  Preparation: The area was cleansed with alcohol.  Procedure Details:  Botox was injected into the dermis with a 30-gauge needle. Pressure applied to any bleeding. Ice packs offered for swelling.  Lot Number:  [id number] Expiration:  07/2025  Total Units Injected:  11  Plan: Tylenol may be used for headache.  Allow 2 weeks before returning to clinic for additional dosing as needed. Patient will call for any problems.  Return in about 3 months (around 12/12/2023) for Botox injections.  Maylene Roes, CMA, am acting as scribe for Armida Sans, MD .   Documentation: I have reviewed the above documentation for accuracy and completeness, and I agree with the above.  Armida Sans, MD

## 2023-09-18 ENCOUNTER — Other Ambulatory Visit: Payer: Self-pay

## 2023-09-18 DIAGNOSIS — J300 Vasomotor rhinitis: Secondary | ICD-10-CM | POA: Diagnosis not present

## 2023-09-18 MED ORDER — GABAPENTIN 100 MG PO CAPS
100.0000 mg | ORAL_CAPSULE | Freq: Every day | ORAL | 1 refills | Status: DC
  Filled 2023-09-18: qty 30, 30d supply, fill #0
  Filled 2023-10-20: qty 30, 30d supply, fill #1

## 2023-09-21 ENCOUNTER — Encounter: Payer: Self-pay | Admitting: Dermatology

## 2023-09-22 ENCOUNTER — Encounter: Payer: Self-pay | Admitting: Internal Medicine

## 2023-09-22 ENCOUNTER — Other Ambulatory Visit: Payer: Self-pay

## 2023-09-22 ENCOUNTER — Ambulatory Visit (INDEPENDENT_AMBULATORY_CARE_PROVIDER_SITE_OTHER): Payer: Commercial Managed Care - PPO | Admitting: Internal Medicine

## 2023-09-22 VITALS — BP 122/74 | HR 82 | Ht 64.0 in | Wt 125.6 lb

## 2023-09-22 DIAGNOSIS — Z0001 Encounter for general adult medical examination with abnormal findings: Secondary | ICD-10-CM | POA: Diagnosis not present

## 2023-09-22 DIAGNOSIS — F419 Anxiety disorder, unspecified: Secondary | ICD-10-CM | POA: Diagnosis not present

## 2023-09-22 DIAGNOSIS — I10 Essential (primary) hypertension: Secondary | ICD-10-CM

## 2023-09-22 DIAGNOSIS — D500 Iron deficiency anemia secondary to blood loss (chronic): Secondary | ICD-10-CM

## 2023-09-22 DIAGNOSIS — E785 Hyperlipidemia, unspecified: Secondary | ICD-10-CM

## 2023-09-22 DIAGNOSIS — R5383 Other fatigue: Secondary | ICD-10-CM | POA: Diagnosis not present

## 2023-09-22 DIAGNOSIS — E611 Iron deficiency: Secondary | ICD-10-CM

## 2023-09-22 DIAGNOSIS — R7303 Prediabetes: Secondary | ICD-10-CM

## 2023-09-22 DIAGNOSIS — E538 Deficiency of other specified B group vitamins: Secondary | ICD-10-CM

## 2023-09-22 DIAGNOSIS — Z0000 Encounter for general adult medical examination without abnormal findings: Secondary | ICD-10-CM

## 2023-09-22 DIAGNOSIS — F339 Major depressive disorder, recurrent, unspecified: Secondary | ICD-10-CM

## 2023-09-22 MED ORDER — FUROSEMIDE 20 MG PO TABS
20.0000 mg | ORAL_TABLET | Freq: Every day | ORAL | 0 refills | Status: DC
  Filled 2023-09-22 – 2023-11-08 (×2): qty 90, 90d supply, fill #0

## 2023-09-22 MED ORDER — AMLODIPINE BESYLATE 2.5 MG PO TABS
2.5000 mg | ORAL_TABLET | Freq: Every day | ORAL | 1 refills | Status: DC
  Filled 2023-09-22 – 2023-11-08 (×2): qty 90, 90d supply, fill #0
  Filled 2024-02-14: qty 90, 90d supply, fill #1

## 2023-09-22 NOTE — Patient Instructions (Addendum)
BREAK  A LEG TONIGHT!   THE IRON SUPPLEMENT IS CALLED [facility] FeR  it is only available through a mail order pharmacy  The drug rep is Lyla Son ,  [employer]  [phone]

## 2023-09-22 NOTE — Progress Notes (Unsigned)
Patient ID: Autumn Martinez, female    DOB: [birth year]  Age: 47 y.o. MRN: [id number]  The patient is here for annual preventive examination and management of other chronic and acute problems.   The risk factors are reflected in the social history.   The roster of all physicians providing medical care to patient - is listed in the Snapshot section of the chart.   Activities of daily living:  The patient is 100% independent in all ADLs: dressing, toileting, feeding as well as independent mobility   Home safety : The patient has smoke detectors in the home. They wear seatbelts.  There are no unsecured firearms at home. There is no violence in the home.    There is no risks for hepatitis, STDs or HIV. There is no   history of blood transfusion. They have no travel history to infectious disease endemic areas of the world.   The patient has seen their dentist in the last six month. They have seen their eye doctor in the last year. The patinet  denies slight hearing difficulty with regard to whispered voices and some television programs.  They have deferred audiologic testing in the last year.  They do not  have excessive sun exposure. Discussed the need for sun protection: hats, long sleeves and use of sunscreen if there is significant sun exposure.    Diet: the importance of a healthy diet is discussed. They do have a healthy diet.   The benefits of regular aerobic exercise were discussed. The patient  exercises  5 to 6 days  per week  for  60 to 90  minutes, alternating between running and weight lifting .    Depression screen: there are no signs or vegative symptoms of untreated  depression-\.  The following portions of the patient's history were reviewed and updated as appropriate: allergies, current medications, past family history, past medical history,  past surgical history, past social history  and problem list.   Visual acuity was not assessed per patient preference since the patient  has regular follow up with an  ophthalmologist. Hearing and body mass index were assessed and reviewed.    During the course of the visit the patient was educated and counseled about appropriate screening and preventive services including : fall prevention , diabetes screening, nutrition counseling, colorectal cancer screening, and recommended immunizations.    Chief Complaint:   1) weight management: she has been intentionally losing weight,  and has lost 10 lbs since June without  use of GLP 1 agonist  since her insurance stopping reimbursing for the medication  she has reached her goal weight.    Review of Symptoms  Patient denies headache, fevers, malaise, unintentional weight loss, skin rash, eye pain, sinus congestion and sinus pain, sore throat, dysphagia,  hemoptysis , cough, dyspnea, wheezing, chest pain, palpitations, orthopnea, edema, abdominal pain, nausea, melena, diarrhea, constipation, flank pain, dysuria, hematuria, urinary  Frequency, nocturia, numbness, tingling, seizures,  Focal weakness, Loss of consciousness,  Tremor, insomnia, depression, anxiety, and suicidal ideation.    Physical Exam:  BP 122/74   Pulse 82   Ht 5\' 4"  (1.626 m)   Wt 125 lb 9.6 oz (57 kg)   SpO2 99%   BMI 21.56 kg/m    Physical Exam Vitals reviewed.  Constitutional:      General: She is not in acute distress.    Appearance: Normal appearance. She is normal weight. She is not ill-appearing, toxic-appearing or diaphoretic.  HENT:  Head: Normocephalic.  Eyes:     General: No scleral icterus.       Right eye: No discharge.        Left eye: No discharge.     Conjunctiva/sclera: Conjunctivae normal.  Cardiovascular:     Rate and Rhythm: Normal rate and regular rhythm.     Heart sounds: Normal heart sounds.  Pulmonary:     Effort: Pulmonary effort is normal. No respiratory distress.     Breath sounds: Normal breath sounds.  Musculoskeletal:        General: Normal range of motion.  Skin:     General: Skin is warm and dry.  Neurological:     General: No focal deficit present.     Mental Status: She is alert and oriented to person, place, and time. Mental status is at baseline.  Psychiatric:        Mood and Affect: Mood normal.        Behavior: Behavior normal.        Thought Content: Thought content normal.        Judgment: Judgment normal.    Assessment and Plan: Essential hypertension Assessment & Plan: Elevated today due to anxiety surrounding a live TV interview she has this evening.  BP has been well controlled on current regimen of amlodipine and losartan . Renal function  is de, home readings have been checked regularly area are 120/70 or less   Lab Results  Component Value Date   CREATININE 0.81 11/07/2022   Lab Results  Component Value Date   NA 139 11/07/2022   K 3.8 11/07/2022   CL 103 11/07/2022   CO2 29 11/07/2022     Orders: -     Comprehensive metabolic panel -     Microalbumin / creatinine urine ratio  Prediabetes -     Comprehensive metabolic panel -     Hemoglobin A1c -     Microalbumin / creatinine urine ratio  Iron deficiency anemia due to chronic blood loss -     CBC with Differential/Platelet -     IBC + Ferritin  Other fatigue -     TSH  Hyperlipidemia, unspecified hyperlipidemia type -     Lipid panel -     LDL cholesterol, direct  Iron deficiency  B12 deficiency -     B12 and Folate Panel  Encounter for preventive health examination Assessment & Plan: age appropriate education and counseling updated, referrals for preventative services and immunizations addressed, dietary and smoking counseling addressed, most recent labs reviewed.  I have personally reviewed and have noted:   1) the patient's medical and social history 2) The pt's use of alcohol, tobacco, and illicit drugs 3) The patient's current medications and supplements 4) Functional ability including ADL's, fall risk, home safety risk, hearing and visual  impairment 5) Diet and physical activities 6) Evidence for depression or mood disorder 7) The patient's height, weight, and BMI have been recorded in the chart   I have made referrals, and provided counseling and education based on review of the above    Depression, recurrent (HCC) Assessment & Plan: Symptoms are in remission with lhigher dose wellbutrin  and citalopram .    No changes today.    Anxiety Assessment & Plan: Continue daily citalopram.  Using buspirone prn, alprazolam rarely .   Other orders -     amLODIPine Besylate; Take 1 tablet (2.5 mg total) by mouth daily.  Dispense: 90 tablet; Refill: 1 -  Furosemide; Take 1 tablet (20 mg total) by mouth daily.  Dispense: 90 tablet; Refill: 0    No follow-ups on file.  Sherlene Shams, MD

## 2023-09-23 LAB — MICROALBUMIN / CREATININE URINE RATIO
Creatinine,U: 116 mg/dL
Microalb Creat Ratio: 0.6 mg/g (ref 0.0–30.0)
Microalb, Ur: <0.7 mg/dL (ref 0.0–1.9)

## 2023-09-23 LAB — COMPREHENSIVE METABOLIC PANEL
ALT: 25 U/L (ref 0–35)
AST: 23 U/L (ref 0–37)
Albumin: 4.2 g/dL (ref 3.5–5.2)
Alkaline Phosphatase: 51 U/L (ref 39–117)
BUN: 18 mg/dL (ref 6–23)
CO2: 26 meq/L (ref 19–32)
Calcium: 8.8 mg/dL (ref 8.4–10.5)
Chloride: 104 meq/L (ref 96–112)
Creatinine, Ser: 0.89 mg/dL (ref 0.40–1.20)
GFR: 76.12 mL/min (ref >60.00)
Glucose, Bld: 79 mg/dL (ref 70–99)
Potassium: 3.8 meq/L (ref 3.5–5.1)
Sodium: 137 meq/L (ref 135–145)
Total Bilirubin: 0.5 mg/dL (ref 0.2–1.2)
Total Protein: 6.7 g/dL (ref 6.0–8.3)

## 2023-09-23 LAB — TSH: TSH: 1.96 u[IU]/mL (ref 0.35–5.50)

## 2023-09-23 LAB — CBC WITH DIFFERENTIAL/PLATELET
Basophils Absolute: 0 10*3/uL (ref 0.0–0.1)
Basophils Relative: 0.6 % (ref 0.0–3.0)
Eosinophils Absolute: 0.1 10*3/uL (ref 0.0–0.7)
Eosinophils Relative: 1.1 % (ref 0.0–5.0)
HCT: 38.1 % (ref 36.0–46.0)
Hemoglobin: 13 g/dL (ref 12.0–15.0)
Lymphocytes Relative: 35.5 % (ref 12.0–46.0)
Lymphs Abs: 2.9 10*3/uL (ref 0.7–4.0)
MCHC: 34 g/dL (ref 30.0–36.0)
MCV: 93.3 fL (ref 78.0–100.0)
Monocytes Absolute: 0.5 10*3/uL (ref 0.1–1.0)
Monocytes Relative: 6.7 % (ref 3.0–12.0)
Neutro Abs: 4.6 10*3/uL (ref 1.4–7.7)
Neutrophils Relative %: 56.1 % (ref 43.0–77.0)
Platelets: 265 10*3/uL (ref 150.0–400.0)
RBC: 4.09 Mil/uL (ref 3.87–5.11)
RDW: 13 % (ref 11.5–15.5)
WBC: 8.2 10*3/uL (ref 4.0–10.5)

## 2023-09-23 LAB — IBC + FERRITIN
Ferritin: 30.5 ng/mL (ref 10.0–291.0)
Iron: 96 ug/dL (ref 42–145)
Saturation Ratios: 33.1 % (ref 20.0–50.0)
TIBC: 289.8 ug/dL (ref 250.0–450.0)
Transferrin: 207 mg/dL — ABNORMAL LOW (ref 212.0–360.0)

## 2023-09-23 LAB — HEMOGLOBIN A1C: Hgb A1c MFr Bld: 5.5 % (ref 4.6–6.5)

## 2023-09-23 LAB — LIPID PANEL
Cholesterol: 162 mg/dL (ref 0–200)
HDL: 55.6 mg/dL (ref >39.00)
LDL Cholesterol: 87 mg/dL (ref 0–99)
NonHDL: 106.25
Total CHOL/HDL Ratio: 3
Triglycerides: 95 mg/dL (ref 0.0–149.0)
VLDL: 19 mg/dL (ref 0.0–40.0)

## 2023-09-23 LAB — B12 AND FOLATE PANEL
Folate: 5.2 ng/mL — ABNORMAL LOW (ref >5.9)
Vitamin B-12: >1537 pg/mL — ABNORMAL HIGH (ref 211–911)

## 2023-09-23 LAB — LDL CHOLESTEROL, DIRECT: Direct LDL: 95 mg/dL

## 2023-09-23 NOTE — Assessment & Plan Note (Signed)

## 2023-09-23 NOTE — Assessment & Plan Note (Signed)
Elevated today due to anxiety surrounding a live TV interview she has this evening.  BP has been well controlled on current regimen of amlodipine and losartan . Renal function  is de, home readings have been checked regularly area are 120/70 or less   Lab Results  Component Value Date   CREATININE 0.81 11/07/2022   Lab Results  Component Value Date   NA 139 11/07/2022   K 3.8 11/07/2022   CL 103 11/07/2022   CO2 29 11/07/2022

## 2023-09-23 NOTE — Assessment & Plan Note (Signed)
Continue daily citalopram.  Using buspirone prn, alprazolam rarely .

## 2023-09-23 NOTE — Assessment & Plan Note (Addendum)
Symptoms are in remission with lhigher dose wellbutrin  and citalopram .    No changes today.

## 2023-10-06 MED FILL — Citalopram Hydrobromide Tab 10 MG (Base Equiv): ORAL | 90 days supply | Qty: 90 | Fill #2 | Status: AC

## 2023-11-08 ENCOUNTER — Telehealth: Payer: Commercial Managed Care - PPO | Admitting: Physician Assistant

## 2023-11-08 DIAGNOSIS — M545 Low back pain, unspecified: Secondary | ICD-10-CM | POA: Diagnosis not present

## 2023-11-08 MED ORDER — CYCLOBENZAPRINE HCL 10 MG PO TABS: 10.0000 mg | ORAL_TABLET | Freq: Every day | ORAL | 0 refills | Status: AC

## 2023-11-08 MED ORDER — METHYLPREDNISOLONE 4 MG PO TBPK
ORAL_TABLET | ORAL | 0 refills | Status: DC
Start: 2023-11-08 — End: 2024-01-14

## 2023-11-08 NOTE — Progress Notes (Signed)
I have spent 5 minutes in review of e-visit questionnaire, review and updating patient chart, medical decision making and response to patient.   Laure Kidney, PA-C

## 2023-11-08 NOTE — Progress Notes (Signed)

## 2023-11-09 ENCOUNTER — Other Ambulatory Visit: Payer: Self-pay

## 2023-11-20 ENCOUNTER — Other Ambulatory Visit: Payer: Self-pay

## 2023-11-20 ENCOUNTER — Encounter: Payer: Self-pay | Admitting: Internal Medicine

## 2023-11-23 ENCOUNTER — Other Ambulatory Visit: Payer: Self-pay

## 2023-11-23 MED ORDER — GABAPENTIN 100 MG PO CAPS
100.0000 mg | ORAL_CAPSULE | Freq: Every day | ORAL | 1 refills | Status: DC
  Filled 2023-11-23: qty 90, 90d supply, fill #0
  Filled 2024-06-14 – 2024-06-24 (×2): qty 90, 90d supply, fill #1

## 2023-11-23 MED ORDER — PREDNISONE 10 MG PO TABS
ORAL_TABLET | ORAL | 0 refills | Status: AC
  Filled 2023-11-23: qty 33, 8d supply, fill #0

## 2023-11-24 ENCOUNTER — Other Ambulatory Visit: Payer: Self-pay

## 2023-12-04 ENCOUNTER — Other Ambulatory Visit: Payer: Self-pay | Admitting: Internal Medicine

## 2023-12-04 ENCOUNTER — Other Ambulatory Visit: Payer: Self-pay

## 2023-12-04 DIAGNOSIS — K219 Gastro-esophageal reflux disease without esophagitis: Secondary | ICD-10-CM

## 2023-12-05 ENCOUNTER — Other Ambulatory Visit: Payer: Self-pay | Admitting: Internal Medicine

## 2023-12-05 ENCOUNTER — Other Ambulatory Visit: Payer: Self-pay

## 2023-12-05 DIAGNOSIS — K219 Gastro-esophageal reflux disease without esophagitis: Secondary | ICD-10-CM

## 2023-12-07 ENCOUNTER — Other Ambulatory Visit: Payer: Self-pay

## 2023-12-08 ENCOUNTER — Other Ambulatory Visit: Payer: Self-pay

## 2023-12-08 MED FILL — Omeprazole Cap Delayed Release 40 MG: ORAL | 90 days supply | Qty: 90 | Fill #0 | Status: AC

## 2023-12-09 ENCOUNTER — Other Ambulatory Visit: Payer: Self-pay

## 2023-12-31 ENCOUNTER — Other Ambulatory Visit: Payer: Self-pay

## 2023-12-31 ENCOUNTER — Other Ambulatory Visit: Payer: Self-pay | Admitting: Internal Medicine

## 2023-12-31 MED ORDER — BUPROPION HCL ER (XL) 300 MG PO TB24
300.0000 mg | ORAL_TABLET | Freq: Every day | ORAL | 1 refills | Status: DC
  Filled 2024-01-08: qty 90, 90d supply, fill #0
  Filled 2024-04-02: qty 90, 90d supply, fill #1

## 2024-01-08 ENCOUNTER — Other Ambulatory Visit: Payer: Self-pay

## 2024-01-08 MED FILL — Citalopram Hydrobromide Tab 10 MG (Base Equiv): ORAL | 90 days supply | Qty: 90 | Fill #3 | Status: AC

## 2024-01-14 ENCOUNTER — Telehealth: Admitting: Physician Assistant

## 2024-01-14 ENCOUNTER — Other Ambulatory Visit: Payer: Self-pay | Admitting: Internal Medicine

## 2024-01-14 DIAGNOSIS — M7918 Myalgia, other site: Secondary | ICD-10-CM | POA: Diagnosis not present

## 2024-01-14 DIAGNOSIS — F339 Major depressive disorder, recurrent, unspecified: Secondary | ICD-10-CM

## 2024-01-14 DIAGNOSIS — F419 Anxiety disorder, unspecified: Secondary | ICD-10-CM

## 2024-01-14 MED ORDER — PREDNISONE 10 MG PO TABS
ORAL_TABLET | ORAL | 0 refills | Status: DC
  Filled 2024-01-14: qty 21, 6d supply, fill #0

## 2024-01-14 NOTE — Progress Notes (Signed)
 E-Visit for Back Pain   We are sorry that you are not feeling well.  Here is how we plan to help!  Based on what you have shared with me it looks like you mostly have acute back pain.  Acute back pain is defined as musculoskeletal pain that can resolve in 1-3 weeks with conservative treatment.  I have prescribed a prednisone taper to take as directed. You can use OTC Tylenol along with this. Please contact your PCP for PT referral if not resolving with this regimen and stretches.   Home Care Stay active.  Start with short walks on flat ground if you can.  Try to walk farther each day. Do not sit, drive or stand in one place for more than 30 minutes.  Do not stay in bed. Do not avoid exercise or work.  Activity can help your back heal faster. Be careful when you bend or lift an object.  Bend at your knees, keep the object close to you, and do not twist. Sleep on a firm mattress.  Lie on your side, and bend your knees.  If you lie on your back, put a pillow under your knees. Only take medicines as told by your doctor. Put ice on the injured area. Put ice in a plastic bag Place a towel between your skin and the bag Leave the ice on for 15-20 minutes, 3-4 times a day for the first 2-3 days. 210 After that, you can switch between ice and heat packs. Ask your doctor about back exercises or massage. Avoid feeling anxious or stressed.  Find good ways to deal with stress, such as exercise.  Get Help Right Way If: Your pain does not go away with rest or medicine. Your pain does not go away in 1 week. You have new problems. You do not feel well. The pain spreads into your legs. You cannot control when you poop (bowel movement) or pee (urinate) You feel sick to your stomach (nauseous) or throw up (vomit) You have belly (abdominal) pain. You feel like you may pass out (faint). If you develop a fever.  Make Sure you: Understand these instructions. Will watch your condition Will get help  right away if you are not doing well or get worse.  Your e-visit answers were reviewed by a board certified advanced clinical practitioner to complete your personal care plan.  Depending on the condition, your plan could have included both over the counter or prescription medications.  If there is a problem please reply  once you have received a response from your provider.  Your safety is important to [place].  If you have drug allergies check your prescription carefully.    You can use MyChart to ask questions about today's visit, request a non-urgent call back, or ask for a work or school excuse for 24 hours related to this e-Visit. If it has been greater than 24 hours you will need to follow up with your provider, or enter a new e-Visit to address those concerns.  You will get an e-mail in the next two days asking about your experience.  I hope that your e-visit has been valuable and will speed your recovery. Thank you for using e-visits.

## 2024-01-14 NOTE — Progress Notes (Signed)
 I have spent 5 minutes in review of e-visit questionnaire, review and updating patient chart, medical decision making and response to patient.   Piedad Climes, PA-C

## 2024-01-15 ENCOUNTER — Other Ambulatory Visit: Payer: Self-pay | Admitting: Internal Medicine

## 2024-01-15 ENCOUNTER — Other Ambulatory Visit: Payer: Self-pay

## 2024-01-15 ENCOUNTER — Ambulatory Visit: Payer: Commercial Managed Care - PPO | Admitting: Dermatology

## 2024-01-15 ENCOUNTER — Encounter: Payer: Self-pay | Admitting: Dermatology

## 2024-01-15 DIAGNOSIS — F419 Anxiety disorder, unspecified: Secondary | ICD-10-CM

## 2024-01-15 DIAGNOSIS — L578 Other skin changes due to chronic exposure to nonionizing radiation: Secondary | ICD-10-CM | POA: Diagnosis not present

## 2024-01-15 DIAGNOSIS — L905 Scar conditions and fibrosis of skin: Secondary | ICD-10-CM | POA: Diagnosis not present

## 2024-01-15 DIAGNOSIS — D1801 Hemangioma of skin and subcutaneous tissue: Secondary | ICD-10-CM

## 2024-01-15 DIAGNOSIS — F339 Major depressive disorder, recurrent, unspecified: Secondary | ICD-10-CM

## 2024-01-15 DIAGNOSIS — Z1283 Encounter for screening for malignant neoplasm of skin: Secondary | ICD-10-CM | POA: Diagnosis not present

## 2024-01-15 DIAGNOSIS — W908XXA Exposure to other nonionizing radiation, initial encounter: Secondary | ICD-10-CM | POA: Diagnosis not present

## 2024-01-15 DIAGNOSIS — L814 Other melanin hyperpigmentation: Secondary | ICD-10-CM

## 2024-01-15 DIAGNOSIS — L821 Other seborrheic keratosis: Secondary | ICD-10-CM | POA: Diagnosis not present

## 2024-01-15 DIAGNOSIS — D229 Melanocytic nevi, unspecified: Secondary | ICD-10-CM

## 2024-01-15 DIAGNOSIS — D2221 Melanocytic nevi of right ear and external auricular canal: Secondary | ICD-10-CM | POA: Diagnosis not present

## 2024-01-15 MED FILL — Citalopram Hydrobromide Tab 10 MG (Base Equiv): ORAL | 90 days supply | Qty: 90 | Fill #0 | Status: CN

## 2024-01-15 NOTE — Progress Notes (Signed)
   Follow-Up Visit   Subjective  Autumn Martinez is a 48 y.o. female who presents for the following: Skin Cancer Screening and Full Body Skin Exam. No personal Hx of skin cancer or dysplastic nevi. No family Hx of skin cancer.   The patient presents for Total-Body Skin Exam (TBSE) for skin cancer screening and mole check. The patient has spots, moles and lesions to be evaluated, some may be new or changing and the patient may have concern these could be cancer.    The following portions of the chart were reviewed this encounter and updated as appropriate: medications, allergies, medical history  Review of Systems:  No other skin or systemic complaints except as noted in HPI or Assessment and Plan.  Objective  Well appearing patient in no apparent distress; mood and affect are within normal limits.  A full examination was performed including scalp, head, eyes, ears, nose, lips, neck, chest, axillae, abdomen, back, buttocks, bilateral upper extremities, bilateral lower extremities, hands, feet, fingers, toes, fingernails, and toenails. All findings within normal limits unless otherwise noted below.   Relevant physical exam findings are noted in the Assessment and Plan.  Exam of nails limited by presence of nail polish.    Assessment & Plan   SKIN CANCER SCREENING PERFORMED TODAY.  ACTINIC DAMAGE - Chronic condition, secondary to cumulative UV/sun exposure - diffuse scaly erythematous macules with underlying dyspigmentation - Recommend daily broad spectrum sunscreen SPF 30+ to sun-exposed areas, reapply every 2 hours as needed.  - Staying in the shade or wearing long sleeves, sun glasses (UVA+UVB protection) and wide brim hats (4-inch brim around the entire circumference of the hat) are also recommended for sun protection.  - Call for new or changing lesions.  LENTIGINES, SEBORRHEIC KERATOSES, HEMANGIOMAS - Benign normal skin lesions - Benign-appearing - Call for any  changes  MELANOCYTIC NEVI - Tan-brown and/or pink-flesh-colored symmetric macules and papules - Benign appearing on exam today - Observation - Call clinic for new or changing moles - Recommend daily use of broad spectrum spf 30+ sunscreen to sun-exposed areas.  - Check nails when removing polish.  - 3 mm pink papule at right posterior helix.  Scar Left posterior thigh   Pigmented lesion removed during childhood per patient  Benign-appearing.  Observation.  Call clinic for new or changing lesions.  Recommend daily use of broad spectrum spf 30+ sunscreen to sun-exposed areas.   MULTIPLE BENIGN NEVI   CHERRY ANGIOMA   SEBORRHEIC KERATOSES   LENTIGINES   ACTINIC ELASTOSIS   Return in about 1 year (around 01/14/2025) for TBSE.  I, Lawson Radar, CMA, am acting as scribe for Elie Goody, MD.   Documentation: I have reviewed the above documentation for accuracy and completeness, and I agree with the above.  Elie Goody, MD

## 2024-01-15 NOTE — Patient Instructions (Signed)

## 2024-01-20 ENCOUNTER — Encounter: Payer: Self-pay | Admitting: Internal Medicine

## 2024-01-22 ENCOUNTER — Ambulatory Visit
Admission: RE | Admit: 2024-01-22 | Discharge: 2024-01-22 | Disposition: A | Discharge status: Home / Self Care | Payer: Commercial Managed Care - PPO | Source: Ambulatory Visit | Attending: Internal Medicine | Admitting: Internal Medicine

## 2024-01-22 DIAGNOSIS — Z1231 Encounter for screening mammogram for malignant neoplasm of breast: Secondary | ICD-10-CM

## 2024-01-23 ENCOUNTER — Other Ambulatory Visit: Payer: Self-pay | Admitting: Internal Medicine

## 2024-01-23 ENCOUNTER — Other Ambulatory Visit: Payer: Self-pay

## 2024-01-23 MED ORDER — AIRSUPRA 90-80 MCG/ACT IN AERO
2.0000 | INHALATION_SPRAY | Freq: Four times a day (QID) | RESPIRATORY_TRACT | 1 refills | Status: DC | PRN
  Filled 2024-01-23: qty 10.7, 30d supply, fill #0

## 2024-01-29 ENCOUNTER — Other Ambulatory Visit: Payer: Self-pay

## 2024-01-29 DIAGNOSIS — Z01419 Encounter for gynecological examination (general) (routine) without abnormal findings: Secondary | ICD-10-CM | POA: Diagnosis not present

## 2024-01-29 DIAGNOSIS — Z1331 Encounter for screening for depression: Secondary | ICD-10-CM | POA: Diagnosis not present

## 2024-01-29 MED ORDER — SLYND 4 MG PO TABS
4.0000 mg | ORAL_TABLET | Freq: Every day | ORAL | 3 refills | Status: AC
  Filled 2024-01-29: qty 84, 84d supply, fill #0
  Filled 2024-08-22: qty 84, 84d supply, fill #1
  Filled 2024-11-03: qty 84, 84d supply, fill #2

## 2024-01-29 MED ORDER — GABAPENTIN 100 MG PO CAPS
100.0000 mg | ORAL_CAPSULE | Freq: Every day | ORAL | 3 refills | Status: AC
  Filled 2024-01-29: qty 90, 90d supply, fill #0

## 2024-01-31 ENCOUNTER — Other Ambulatory Visit (HOSPITAL_BASED_OUTPATIENT_CLINIC_OR_DEPARTMENT_OTHER): Payer: Self-pay

## 2024-01-31 MED FILL — Citalopram Hydrobromide Tab 10 MG (Base Equiv): ORAL | 90 days supply | Qty: 90 | Fill #0 | Status: CN

## 2024-02-01 ENCOUNTER — Other Ambulatory Visit: Payer: Self-pay

## 2024-02-14 ENCOUNTER — Other Ambulatory Visit: Payer: Self-pay | Admitting: Internal Medicine

## 2024-02-16 ENCOUNTER — Other Ambulatory Visit: Payer: Self-pay

## 2024-02-16 MED ORDER — FUROSEMIDE 20 MG PO TABS
20.0000 mg | ORAL_TABLET | Freq: Every day | ORAL | 0 refills | Status: DC
  Filled 2024-02-16: qty 90, 90d supply, fill #0

## 2024-03-25 ENCOUNTER — Ambulatory Visit (INDEPENDENT_AMBULATORY_CARE_PROVIDER_SITE_OTHER): Payer: Self-pay | Admitting: Dermatology

## 2024-03-25 DIAGNOSIS — L988 Other specified disorders of the skin and subcutaneous tissue: Secondary | ICD-10-CM

## 2024-03-25 NOTE — Progress Notes (Signed)
   Follow-Up Visit   Subjective  Autumn Martinez is a 48 y.o. female who presents for the following: Botox for facial elastosis  The following portions of the chart were reviewed this encounter and updated as appropriate: medications, allergies, medical history  Review of Systems:  No other skin or systemic complaints except as noted in HPI or Assessment and Plan.  Objective  Well appearing patient in no apparent distress; mood and affect are within normal limits.  A focused examination was performed of the face.  Relevant physical exam findings are noted in the Assessment and Plan.  Before injection photos       Injection map photo     Assessment & Plan    Facial Elastosis Botox 11 units injected today to: - Forehead 11 units (upper injections of 2 units x 3 are 4.0cm above brow, lower injections of 2 units x 2 and 1 unit x 1 3.0cm above brow)  Location: forehead  Informed consent: Discussed risks (infection, pain, bleeding, bruising, swelling, allergic reaction, paralysis of nearby muscles, eyelid droop, double vision, neck weakness, difficulty breathing, headache, undesirable cosmetic result, and need for additional treatment) and benefits of the procedure, as well as the alternatives.  Informed consent was obtained.  Preparation: The area was cleansed with alcohol.  Procedure Details:  Botox was injected into the dermis with a 30-gauge needle. Pressure applied to any bleeding. Ice packs offered for swelling.  Lot Number:  [id number] Expiration:  02/2026  Total Units Injected:  11  Plan: Tylenol  may be used for headache.  Allow 2 weeks before returning to clinic for additional dosing as needed. Patient will call for any problems.  Return in 6 months (on 09/25/2024) for botox.  I, Rollie Clipper, RMA, am acting as scribe for Celine Collard, MD .   Documentation: I have reviewed the above documentation for accuracy and completeness, and I agree with the  above.  Celine Collard, MD

## 2024-03-25 NOTE — Patient Instructions (Signed)

## 2024-03-29 ENCOUNTER — Encounter: Payer: Self-pay | Admitting: Dermatology

## 2024-04-02 ENCOUNTER — Other Ambulatory Visit: Payer: Self-pay | Admitting: Internal Medicine

## 2024-04-02 ENCOUNTER — Other Ambulatory Visit: Payer: Self-pay

## 2024-04-02 MED FILL — Citalopram Hydrobromide Tab 10 MG (Base Equiv): ORAL | 90 days supply | Qty: 90 | Fill #0 | Status: AC

## 2024-04-06 ENCOUNTER — Other Ambulatory Visit: Payer: Self-pay

## 2024-04-06 MED FILL — Clindamycin Phosphate-Benzoyl Peroxide Gel 1-5%: CUTANEOUS | 30 days supply | Qty: 50 | Fill #0 | Status: AC

## 2024-04-06 NOTE — Telephone Encounter (Signed)
 Refilled: 07/09/2023 Last OV: 09/22/2023 Next OV: 05/10/2024

## 2024-04-08 ENCOUNTER — Other Ambulatory Visit (HOSPITAL_COMMUNITY): Payer: Self-pay

## 2024-05-09 ENCOUNTER — Other Ambulatory Visit: Payer: Self-pay | Admitting: Internal Medicine

## 2024-05-10 ENCOUNTER — Telehealth: Admitting: Internal Medicine

## 2024-05-10 ENCOUNTER — Encounter: Payer: Self-pay | Admitting: Internal Medicine

## 2024-05-10 ENCOUNTER — Other Ambulatory Visit: Payer: Self-pay

## 2024-05-10 ENCOUNTER — Other Ambulatory Visit: Payer: Self-pay | Admitting: Internal Medicine

## 2024-05-10 VITALS — BP 122/76 | Ht 64.0 in | Wt 122.0 lb

## 2024-05-10 DIAGNOSIS — F419 Anxiety disorder, unspecified: Secondary | ICD-10-CM | POA: Diagnosis not present

## 2024-05-10 DIAGNOSIS — I10 Essential (primary) hypertension: Secondary | ICD-10-CM | POA: Diagnosis not present

## 2024-05-10 DIAGNOSIS — F339 Major depressive disorder, recurrent, unspecified: Secondary | ICD-10-CM | POA: Diagnosis not present

## 2024-05-10 DIAGNOSIS — R7303 Prediabetes: Secondary | ICD-10-CM

## 2024-05-10 DIAGNOSIS — F40243 Fear of flying: Secondary | ICD-10-CM | POA: Diagnosis not present

## 2024-05-10 MED ORDER — AMLODIPINE BESYLATE 2.5 MG PO TABS
2.5000 mg | ORAL_TABLET | Freq: Every day | ORAL | 1 refills | Status: DC
  Filled 2024-05-10: qty 90, 90d supply, fill #0
  Filled 2024-08-10: qty 90, 90d supply, fill #1

## 2024-05-10 MED ORDER — LEVOFLOXACIN 500 MG PO TABS
500.0000 mg | ORAL_TABLET | Freq: Every day | ORAL | 0 refills | Status: AC
  Filled 2024-05-10: qty 7, 7d supply, fill #0

## 2024-05-10 MED ORDER — LOSARTAN POTASSIUM 100 MG PO TABS
100.0000 mg | ORAL_TABLET | Freq: Every day | ORAL | 3 refills | Status: AC
  Filled 2024-05-10 – 2024-05-16 (×2): qty 90, 90d supply, fill #0
  Filled 2024-08-22: qty 90, 90d supply, fill #1
  Filled 2024-11-21: qty 90, 90d supply, fill #2

## 2024-05-10 MED ORDER — ALPRAZOLAM 0.5 MG PO TABS
0.5000 mg | ORAL_TABLET | Freq: Every evening | ORAL | 0 refills | Status: DC | PRN
  Filled 2024-05-10: qty 20, 20d supply, fill #0

## 2024-05-10 MED ORDER — ALBUTEROL SULFATE HFA 108 (90 BASE) MCG/ACT IN AERS
2.0000 | INHALATION_SPRAY | Freq: Four times a day (QID) | RESPIRATORY_TRACT | 11 refills | Status: AC | PRN
  Filled 2024-05-10: qty 6.7, 25d supply, fill #0

## 2024-05-10 MED ORDER — PREDNISONE 10 MG PO TABS
ORAL_TABLET | ORAL | 0 refills | Status: AC
  Filled 2024-05-10: qty 33, 8d supply, fill #0

## 2024-05-10 MED ORDER — BUPROPION HCL ER (XL) 300 MG PO TB24
300.0000 mg | ORAL_TABLET | Freq: Every day | ORAL | 1 refills | Status: AC
  Filled 2024-05-10 – 2024-11-21 (×2): qty 90, 90d supply, fill #0

## 2024-05-10 MED ORDER — FUROSEMIDE 20 MG PO TABS
20.0000 mg | ORAL_TABLET | Freq: Every day | ORAL | 0 refills | Status: DC
  Filled 2024-05-10: qty 90, 90d supply, fill #0

## 2024-05-10 MED ORDER — BUPROPION HCL ER (XL) 150 MG PO TB24
150.0000 mg | ORAL_TABLET | Freq: Every day | ORAL | 0 refills | Status: DC
Start: 2024-05-10 — End: 2024-06-14
  Filled 2024-05-10: qty 30, 30d supply, fill #0

## 2024-05-10 MED ORDER — ONDANSETRON 4 MG PO TBDP
4.0000 mg | ORAL_TABLET | Freq: Three times a day (TID) | ORAL | 0 refills | Status: DC | PRN
  Filled 2024-05-10: qty 20, 7d supply, fill #0

## 2024-05-10 NOTE — Progress Notes (Unsigned)
 Virtual Visit  FAILED ATTEMPT, CONVERTED TO TELEPHONE    Note   This format is felt to be most appropriate for this patient at this time.  All issues noted in this document were discussed and addressed.  No physical exam was performed (except for noted visual exam findings with Video Visits).   I connected with  Wendie on 05/10/24 at  5:00 PM EDT by a video enabled telemedicine application or telephone and verified that I am speaking with the correct person using two identifiers. Location patient: home Location provider: work or home office Persons participating in the virtual visit: patient, provider  I discussed the limitations, risks, security and privacy concerns of performing an evaluation and management service by telephone and the availability of in person appointments. I also discussed with the patient that there may be a patient responsible charge related to this service. The patient expressed understanding and agreed to proceed.  Interactive audio and video telecommunications were INITIALLY ACHIEVED.   between this provider and patient, however ultimately failed due to patient's audio not working.  .  We continued and completed visit with telephone   Reason for visit: medication refills and travel meds   HPI: ***   ROS: See pertinent positives and negatives per HPI.  Past Medical History:  Diagnosis Date   Allergy    Anxiety    COVID-19 virus infection 08/08/2022   Depression    Family history of breast cancer 10/2021   cancer genetic testing letter sent   GERD (gastroesophageal reflux disease)    Heart murmur    Hypertension    IDA (iron deficiency anemia)     Past Surgical History:  Procedure Laterality Date   BREAST BIOPSY Right 2018   benign   BREAST BIOPSY Left 04/22/2018    FIBROCYSTIC CHANGE WITH CALCIFICATIONS   CESAREAN SECTION  2002   CESAREAN SECTION  2007   CHOLECYSTECTOMY  2005   TONSILLECTOMY AND ADENOIDECTOMY Bilateral 1980   TUBAL LIGATION       Family History  Problem Relation Age of Onset   Hypertension Mother    Diabetes Mother 69   Lymphoma Mother 82   Hypertension Father    Brain cancer Father    Cancer Father    Cancer Maternal Aunt    Breast cancer Maternal Aunt 48   Mental retardation Maternal Uncle    Ovarian cancer Maternal Aunt 58   Endometrial cancer Maternal Aunt 40   Colon cancer Neg Hx    Esophageal cancer Neg Hx    Stomach cancer Neg Hx    Rectal cancer Neg Hx     SOCIAL HX: ***   Current Outpatient Medications:    Albuterol -Budesonide  (AIRSUPRA ) 90-80 MCG/ACT AERO, Inhale 2 puffs into the lungs every 6 (six) hours as needed (wheezing)., Disp: 5.9 g, Rfl: 1   amLODipine  (NORVASC ) 2.5 MG tablet, Take 1 tablet (2.5 mg total) by mouth daily., Disp: 90 tablet, Rfl: 1   buPROPion  (WELLBUTRIN  XL) 300 MG 24 hr tablet, Take 1 tablet (300 mg total) by mouth daily., Disp: 90 tablet, Rfl: 1   citalopram  (CELEXA ) 10 MG tablet, Take 1 tablet (10 mg total) by mouth daily., Disp: 90 tablet, Rfl: 3   clindamycin -benzoyl peroxide  (BENZACLIN) gel, Apply topically 2 (two) times daily., Disp: 35 g, Rfl: 2   Drospirenone  (SLYND ) 4 MG TABS, Take 1 tablet (4 mg total) by mouth daily., Disp: 90 tablet, Rfl: 3   ferrous sulfate (EQL SLOW RELEASE IRON) 160 (50 Fe) MG  TBCR SR tablet, Take 160 mg by mouth daily., Disp: , Rfl:    furosemide  (LASIX ) 20 MG tablet, Take 1 tablet (20 mg total) by mouth daily., Disp: 90 tablet, Rfl: 0   gabapentin  (NEURONTIN ) 100 MG capsule, Take 1 capsule (100 mg total) by mouth at bedtime. (Patient taking differently: Take 100 mg by mouth as needed.), Disp: 90 capsule, Rfl: 1   gabapentin  (NEURONTIN ) 100 MG capsule, Take 1 capsule (100 mg total) by mouth at bedtime., Disp: 90 capsule, Rfl: 3   losartan  (COZAAR ) 100 MG tablet, Take 1 tablet (100 mg total) by mouth daily., Disp: 90 tablet, Rfl: 3   MIEBO 1.338 GM/ML SOLN, , Disp: , Rfl:    Multiple Vitamins-Minerals (MULTIVITAMIN WITH MINERALS)  tablet, Take 1 tablet by mouth daily., Disp: , Rfl:    omeprazole  (PRILOSEC) 40 MG capsule, Take 1 capsule (40 mg total) by mouth daily., Disp: 90 capsule, Rfl: 1   ALPRAZolam  (XANAX ) 0.5 MG tablet, Take 1 tablet (0.5 mg total) by mouth at bedtime as needed for anxiety. (Patient not taking: Reported on 05/10/2024), Disp: 20 tablet, Rfl: 0   busPIRone  (BUSPAR ) 10 MG tablet, Take 1 tablet (10 mg total) by mouth 2 (two) times daily. (Patient not taking: Reported on 05/10/2024), Disp: 60 tablet, Rfl: 1   predniSONE  (DELTASONE ) 10 MG tablet, Take 6 tablets by mouth on day 1, then decrease by one tablet each day, Disp: 21 tablet, Rfl: 0  EXAM:  VITALS per patient if applicable:  GENERAL: alert, oriented, appears well and in no acute distress  HEENT: atraumatic, conjunttiva clear, no obvious abnormalities on inspection of external nose and ears  NECK: normal movements of the head and neck  LUNGS: on inspection no signs of respiratory distress, breathing rate appears normal, no obvious gross SOB, gasping or wheezing  CV: no obvious cyanosis  MS: moves all visible extremities without noticeable abnormality  PSYCH/NEURO: pleasant and cooperative, no obvious depression or anxiety, speech and thought processing grossly intact  ASSESSMENT AND PLAN: There are no diagnoses linked to this encounter.    I discussed the assessment and treatment plan with the patient. The patient was provided an opportunity to ask questions and all were answered. The patient agreed with the plan and demonstrated an understanding of the instructions.   The patient was advised to call back or seek an in-person evaluation if the symptoms worsen or if the condition fails to improve as anticipated.   I spent 30 minutes dedicated to the care of this patient on the date of this encounter to include pre-visit review of patient's medical history,  including recent ER visit, imaging studies and labs, face-to-face time with the  patient , and post visit ordering of testing and therapeutics.    Verneita LITTIE Kettering, MD

## 2024-05-11 NOTE — Assessment & Plan Note (Signed)
 Symptoms are in remission with higher dose wellbutrin   . Refills given today and 150 mg SR dose refilled as well

## 2024-05-11 NOTE — Assessment & Plan Note (Addendum)
 She has had an Improved a1c with weight loss    Lab Results  Component Value Date   HGBA1C 5.5 09/22/2023

## 2024-05-11 NOTE — Assessment & Plan Note (Addendum)
 Buspirone  was not helpful.  Alprazolam  refilled.  The risks and benefits of  Chronic  benzodiazepine use were discussed with patient today including increased risk of dementia,  Addiction, and seizures if abruptly withdrawn.  she is using alprazolam  prn last refill per [facility] database was April 2024

## 2024-05-11 NOTE — Assessment & Plan Note (Signed)
 BP has been well controlled on current regimen of amlodipine  and losartan  . Renal function  is normal, home readings have been checked regularly area are 120/70 or less   Lab Results  Component Value Date   CREATININE 0.89 09/22/2023   Lab Results  Component Value Date   NA 137 09/22/2023   K 3.8 09/22/2023   CL 104 09/22/2023   CO2 26 09/22/2023

## 2024-05-11 NOTE — Assessment & Plan Note (Signed)
 Continue daily citalopram .  Using, alprazolam  rarely .

## 2024-05-14 ENCOUNTER — Telehealth: Admitting: Physician Assistant

## 2024-05-14 DIAGNOSIS — J019 Acute sinusitis, unspecified: Secondary | ICD-10-CM | POA: Diagnosis not present

## 2024-05-14 DIAGNOSIS — B9689 Other specified bacterial agents as the cause of diseases classified elsewhere: Secondary | ICD-10-CM | POA: Diagnosis not present

## 2024-05-14 MED ORDER — AZITHROMYCIN 250 MG PO TABS: ORAL_TABLET | ORAL | 0 refills | Status: AC

## 2024-05-14 MED ORDER — PREDNISONE 10 MG (21) PO TBPK: ORAL_TABLET | ORAL | 0 refills | Status: DC

## 2024-05-14 NOTE — Progress Notes (Signed)
 E-Visit for Sinus Problems  We are sorry that you are not feeling well.  Here is how we plan to help!  Based on what you have shared with me it looks like you have sinusitis.  Sinusitis is inflammation and infection in the sinus cavities of the head.  Based on your presentation I believe you most likely have Acute Bacterial Sinusitis.  This is an infection caused by bacteria and is treated with antibiotics. I have prescribed Azithromycin  250mg  Take 2 tablets on day 1, then 1 tablet daily for the following 4 days. I have also prescribed a 6-day taper of Prednisone . You may use an oral decongestant such as Mucinex  D or if you have glaucoma or high blood pressure use plain Mucinex . Saline nasal spray help and can safely be used as often as needed for congestion.  If you develop worsening sinus pain, fever or notice severe headache and vision changes, or if symptoms are not better after completion of antibiotic, please schedule an appointment with a health care provider.    Sinus infections are not as easily transmitted as other respiratory infection, however we still recommend that you avoid close contact with loved ones, especially the very young and elderly.  Remember to wash your hands thoroughly throughout the day as this is the number one way to prevent the spread of infection!  Home Care: Only take medications as instructed by your medical team. Complete the entire course of an antibiotic. Do not take these medications with alcohol. A steam or ultrasonic humidifier can help congestion.  You can place a towel over your head and breathe in the steam from hot water coming from a faucet. Avoid close contacts especially the very young and the elderly. Cover your mouth when you cough or sneeze. Always remember to wash your hands.  Get Help Right Away If: You develop worsening fever or sinus pain. You develop a severe head ache or visual changes. Your symptoms persist after you have completed your  treatment plan.  Make sure you Understand these instructions. Will watch your condition. Will get help right away if you are not doing well or get worse.  Thank you for choosing an e-visit.  Your e-visit answers were reviewed by a board certified advanced clinical practitioner to complete your personal care plan. Depending upon the condition, your plan could have included both over the counter or prescription medications.  Please review your pharmacy choice. Make sure the pharmacy is open so you can pick up prescription now. If there is a problem, you may contact your provider through [employer] and have the prescription routed to another pharmacy.  Your safety is important to us . If you have drug allergies check your prescription carefully.   For the next 24 hours you can use MyChart to ask questions about today's visit, request a non-urgent call back, or ask for a work or school excuse. You will get an email in the next two days asking about your experience. I hope that your e-visit has been valuable and will speed your recovery.   I have spent 5 minutes in review of e-visit questionnaire, review and updating patient chart, medical decision making and response to patient.   Delon CHRISTELLA Dickinson, PA-C

## 2024-05-16 ENCOUNTER — Other Ambulatory Visit: Payer: Self-pay

## 2024-06-14 ENCOUNTER — Other Ambulatory Visit: Payer: Self-pay

## 2024-06-14 ENCOUNTER — Other Ambulatory Visit: Payer: Self-pay | Admitting: Internal Medicine

## 2024-06-14 ENCOUNTER — Telehealth: Admitting: Physician Assistant

## 2024-06-14 DIAGNOSIS — B9689 Other specified bacterial agents as the cause of diseases classified elsewhere: Secondary | ICD-10-CM

## 2024-06-14 DIAGNOSIS — R059 Cough, unspecified: Secondary | ICD-10-CM

## 2024-06-14 MED ORDER — PREDNISONE 10 MG PO TABS
ORAL_TABLET | ORAL | 0 refills | Status: AC
  Filled 2024-06-14: qty 21, 6d supply, fill #0

## 2024-06-14 MED ORDER — AMOXICILLIN-POT CLAVULANATE 875-125 MG PO TABS
1.0000 | ORAL_TABLET | Freq: Two times a day (BID) | ORAL | 0 refills | Status: DC
  Filled 2024-06-14: qty 14, 7d supply, fill #0

## 2024-06-14 MED FILL — Bupropion HCl Tab ER 24HR 150 MG: ORAL | 30 days supply | Qty: 30 | Fill #0 | Status: AC

## 2024-06-14 NOTE — Progress Notes (Signed)
  Because of the duration of your symptoms, I feel your condition warrants further evaluation and I recommend that you be seen in a face-to-face visit.   NOTE: There will be NO CHARGE for this E-Visit   If you are having a true medical emergency, please call 911.     For an urgent face to face visit, [facility] has multiple urgent care centers for your convenience.  Click the link below for the full list of locations and hours, walk-in wait times, appointment scheduling options and driving directions:  Urgent Care - [place], [place], [place], [place], [place], [place]  [place]     Your MyChart E-visit questionnaire answers were reviewed by a board certified advanced clinical practitioner to complete your personal care plan based on your specific symptoms.    Thank you for using e-Visits.

## 2024-06-24 ENCOUNTER — Ambulatory Visit
Admission: EM | Admit: 2024-06-24 | Discharge: 2024-06-24 | Disposition: A | Discharge status: Home / Self Care | Attending: Emergency Medicine | Admitting: Emergency Medicine

## 2024-06-24 ENCOUNTER — Ambulatory Visit (INDEPENDENT_AMBULATORY_CARE_PROVIDER_SITE_OTHER)

## 2024-06-24 ENCOUNTER — Other Ambulatory Visit: Payer: Self-pay

## 2024-06-24 DIAGNOSIS — R059 Cough, unspecified: Secondary | ICD-10-CM | POA: Diagnosis not present

## 2024-06-24 DIAGNOSIS — R051 Acute cough: Secondary | ICD-10-CM

## 2024-06-24 DIAGNOSIS — R509 Fever, unspecified: Secondary | ICD-10-CM | POA: Diagnosis not present

## 2024-06-24 MED ORDER — DOXYCYCLINE HYCLATE 100 MG PO CAPS
100.0000 mg | ORAL_CAPSULE | Freq: Two times a day (BID) | ORAL | 0 refills | Status: AC
  Filled 2024-06-24: qty 14, 7d supply, fill #0

## 2024-06-24 NOTE — ED Triage Notes (Addendum)
 Patient to Urgent Care with complaints of cough/ fevers/ body aches.  Symptoms x4 weeks. Treated w/ augmentin  without any change in symptoms. Fever this afternoon.   Multiple negative covid tests.  Taking mucinex / delsym.

## 2024-06-24 NOTE — ED Provider Notes (Signed)
 [facility]    CSN: [id number] Arrival date & time: 06/24/24  1539      History   Chief Complaint Chief Complaint  Patient presents with   Cough    HPI Autumn Martinez is a 48 y.o. female.  Patient presents with 4-week history of cough.  Her cough is occasionally productive.  No chest pain or shortness of breath.  She reports fever of 100.8 today along with bodyaches.  She has been treating her symptoms with Mucinex  and Delsym.  Her cough started after she returned from a trip to [place].  Patient had a telehealth visit on 06/14/2024; instructed to be seen in person.  Patient was prescribed Augmentin  and prednisone  by her PCP on 06/14/2024.  Her symptoms did not improve with these medications.  The history is provided by the patient and medical records.    Past Medical History:  Diagnosis Date   Allergy    Anxiety    COVID-19 virus infection 08/08/2022   Depression    Family history of breast cancer 10/2021   cancer genetic testing letter sent   GERD (gastroesophageal reflux disease)    Heart murmur    Hypertension    IDA (iron deficiency anemia)     Patient Active Problem List   Diagnosis Date Noted   Personal history of COVID-19 03/27/2021   Depression, recurrent (HCC) 04/20/2020   Anxiety 04/20/2020   Cardiac murmur 04/20/2020   Prediabetes 09/25/2019   Iron deficiency anemia due to chronic blood loss 09/25/2019   Pyriformis syndrome, left 02/04/2019   Fear of flying 05/04/2018   GERD (gastroesophageal reflux disease) 02/17/2018   PMDD (premenstrual dysphoric disorder) 06/20/2016   PVC (premature ventricular contraction) 07/20/2015   Encounter for preventive health examination 10/25/2014   Pap smear for cervical cancer screening 10/25/2014   Food allergy 10/25/2014   Heart murmur, systolic 05/18/2013   Fatigue 92/91/7985   Menometrorrhagia 05/18/2013   PTSD (post-traumatic stress disorder) 05/18/2013   Essential hypertension 02/16/2009    Past  Surgical History:  Procedure Laterality Date   BREAST BIOPSY Right 2018   benign   BREAST BIOPSY Left 04/22/2018    FIBROCYSTIC CHANGE WITH CALCIFICATIONS   CESAREAN SECTION  2002   CESAREAN SECTION  2007   CHOLECYSTECTOMY  2005   TONSILLECTOMY AND ADENOIDECTOMY Bilateral 1980   TUBAL LIGATION      OB History     Gravida  3   Para  2   Term  2   Preterm      AB  1   Living  2      SAB  1   IAB      Ectopic      Multiple      Live Births  2            Home Medications    Prior to Admission medications   Medication Sig Start Date End Date Taking? Authorizing Provider  doxycycline  (VIBRAMYCIN ) 100 MG capsule Take 1 capsule (100 mg total) by mouth 2 (two) times daily for 7 days. 06/24/24 07/01/24 Yes Corlis Burnard DEL, NP  albuterol  (VENTOLIN  HFA) 108 (90 Base) MCG/ACT inhaler Inhale 2 puffs into the lungs every 6 (six) hours as needed for wheezing or shortness of breath. 05/10/24   Marylynn Verneita CROME, MD  ALPRAZolam  (XANAX ) 0.5 MG tablet Take 1 tablet (0.5 mg total) by mouth at bedtime as needed for anxiety. 05/10/24   Marylynn Verneita CROME, MD  amLODipine  (NORVASC ) 2.5 MG  tablet Take 1 tablet (2.5 mg total) by mouth daily. 05/10/24 05/10/25  Tullo, Teresa L, MD  amoxicillin -clavulanate (AUGMENTIN ) 875-125 MG tablet Take 1 tablet by mouth 2 (two) times daily. Patient not taking: Reported on 06/24/2024 06/14/24   Marylynn Verneita CROME, MD  buPROPion  (WELLBUTRIN  XL) 150 MG 24 hr tablet Take 1 tablet (150 mg total) by mouth daily. In addition to 300 mg ,  as needed 06/14/24   Tullo, Teresa L, MD  buPROPion  (WELLBUTRIN  XL) 300 MG 24 hr tablet Take 1 tablet (300 mg total) by mouth daily. 05/10/24   Marylynn Verneita CROME, MD  citalopram  (CELEXA ) 10 MG tablet Take 1 tablet (10 mg total) by mouth daily. 01/15/24 01/14/25  Marylynn Verneita CROME, MD  clindamycin -benzoyl peroxide  (BENZACLIN) gel Apply topically 2 (two) times daily. 04/06/24   Marylynn Verneita CROME, MD  Drospirenone  (SLYND ) 4 MG TABS Take 1 tablet (4 mg  total) by mouth daily. 01/29/24     ferrous sulfate (EQL SLOW RELEASE IRON) 160 (50 Fe) MG TBCR SR tablet Take 160 mg by mouth daily.    [provider]  furosemide  (LASIX ) 20 MG tablet Take 1 tablet (20 mg total) by mouth daily. 05/10/24 05/10/25  Marylynn Verneita CROME, MD  gabapentin  (NEURONTIN ) 100 MG capsule Take 1 capsule (100 mg total) by mouth at bedtime. Patient taking differently: Take 100 mg by mouth as needed. 11/23/23   Marylynn Verneita CROME, MD  gabapentin  (NEURONTIN ) 100 MG capsule Take 1 capsule (100 mg total) by mouth at bedtime. 01/29/24     losartan  (COZAAR ) 100 MG tablet Take 1 tablet (100 mg total) by mouth daily. 05/10/24 05/10/25  Marylynn Verneita CROME, MD  MIEBO 1.338 GM/ML SOLN  12/19/23   [provider]  Multiple Vitamins-Minerals (MULTIVITAMIN WITH MINERALS) tablet Take 1 tablet by mouth daily.    [provider]  omeprazole  (PRILOSEC) 40 MG capsule Take 1 capsule (40 mg total) by mouth daily. 12/08/23 12/07/24  Marylynn Verneita CROME, MD  ondansetron  (ZOFRAN -ODT) 4 MG disintegrating tablet Take 1 tablet (4 mg total) by mouth every 8 (eight) hours as needed for nausea or vomiting. 05/10/24   Marylynn Verneita CROME, MD    Family History Family History  Problem Relation Age of Onset   Hypertension Mother    Diabetes Mother 7   Lymphoma Mother 28   Hypertension Father    Brain cancer Father    Cancer Father    Cancer Maternal Aunt    Breast cancer Maternal Aunt 48   Mental retardation Maternal Uncle    Ovarian cancer Maternal Aunt 58   Endometrial cancer Maternal Aunt 58   Colon cancer Neg Hx    Esophageal cancer Neg Hx    Stomach cancer Neg Hx    Rectal cancer Neg Hx     Social History Social History   Tobacco Use   Smoking status: Never   Smokeless tobacco: Never  Vaping Use   Vaping status: Never Used  Substance Use Topics   Alcohol use: No   Drug use: No     Allergies   Butorphanol tartrate, Sulfonamide derivatives, and Hctz  [hydrochlorothiazide]   Review of Systems Review of Systems  Constitutional:  Positive for fever. Negative for chills.  HENT:  Negative for ear pain and sore throat.   Respiratory:  Positive for cough. Negative for shortness of breath.   Cardiovascular:  Negative for chest pain and palpitations.     Physical Exam Triage Vital Signs ED Triage Vitals  Encounter  Vitals Group     BP 06/24/24 1655 122/75     Girls Systolic BP Percentile --      Girls Diastolic BP Percentile --      Boys Systolic BP Percentile --      Boys Diastolic BP Percentile --      Pulse Rate 06/24/24 1655 86     Resp 06/24/24 1655 18     Temp 06/24/24 1655 98.3 F (36.8 C)     Temp Source 06/24/24 1655 Oral     SpO2 06/24/24 1655 98 %     Weight --      Height --      Head Circumference --      Peak Flow --      Pain Score 06/24/24 1648 0     Pain Loc --      Pain Education --      Exclude from Growth Chart --    No data found.  Updated Vital Signs BP 122/75   Pulse 86   Temp 98.3 F (36.8 C) (Oral)   Resp 18   SpO2 98%   Visual Acuity Right Eye Distance:   Left Eye Distance:   Bilateral Distance:    Right Eye Near:   Left Eye Near:    Bilateral Near:     Physical Exam Constitutional:      General: She is not in acute distress. HENT:     Right Ear: Tympanic membrane normal.     Left Ear: Tympanic membrane normal.     Nose: Nose normal.     Mouth/Throat:     Mouth: Mucous membranes are moist.     Pharynx: Oropharynx is clear.  Cardiovascular:     Rate and Rhythm: Normal rate and regular rhythm.     Heart sounds: Normal heart sounds.  Pulmonary:     Effort: Pulmonary effort is normal. No respiratory distress.     Breath sounds: Normal breath sounds.  Neurological:     Mental Status: She is alert.      UC Treatments / Results  Labs (all labs ordered are listed, but only abnormal results are displayed) Labs Reviewed - No data to display  EKG   Radiology DG Chest 2  View Result Date: 06/24/2024 CLINICAL DATA:  Cough, fevers, body aches EXAM: CHEST - 2 VIEW COMPARISON:  12/26/2010 FINDINGS: Frontal and lateral views of the chest demonstrate an unremarkable cardiac silhouette. No acute airspace disease, effusion, or pneumothorax. No acute bony abnormalities. IMPRESSION: 1. No acute intrathoracic process. Electronically Signed   By: Ozell Daring M.D.   On: 06/24/2024 18:06    Procedures Procedures (including critical care time)  Medications Ordered in UC Medications - No data to display  Initial Impression / Assessment and Plan / UC Course  I have reviewed the triage vital signs and the nursing notes.  Pertinent labs & imaging results that were available during my care of the patient were reviewed by me and considered in my medical decision making (see chart for details).    Cough.  O2 sat 98% on room air.  Afebrile and vital signs are stable.  CXR negative.  Patient has been symptomatic for 4 weeks.  Treating today with doxycycline .  Instructed patient to follow-up with her PCP tomorrow.  ED precautions given.  Education provided on cough.  She agrees to plan of care.  Final Clinical Impressions(s) / UC Diagnoses   Final diagnoses:  Acute cough     Discharge Instructions  Your chest xray is pending.  Take doxycycline  as directed.  Follow up with your primary care provider tomorrow.  Go to the emergency department if you have worsening symptoms.        ED Prescriptions     Medication Sig Dispense Auth. Provider   doxycycline  (VIBRAMYCIN ) 100 MG capsule Take 1 capsule (100 mg total) by mouth 2 (two) times daily for 7 days. 14 capsule Corlis Burnard DEL, NP      PDMP not reviewed this encounter.   Corlis Burnard DEL, NP 06/24/24 [phone]

## 2024-06-24 NOTE — Discharge Instructions (Signed)
 Your chest xray is pending.  Take doxycycline  as directed.  Follow up with your primary care provider tomorrow.  Go to the emergency department if you have worsening symptoms.

## 2024-06-25 ENCOUNTER — Ambulatory Visit (HOSPITAL_COMMUNITY): Payer: Self-pay

## 2024-07-04 ENCOUNTER — Inpatient Hospital Stay
Admission: RE | Admit: 2024-07-04 | Discharge: 2024-07-04 | Discharge status: Home / Self Care | Source: Ambulatory Visit | Attending: Emergency Medicine | Admitting: Emergency Medicine

## 2024-07-04 ENCOUNTER — Other Ambulatory Visit: Payer: Self-pay

## 2024-07-04 VITALS — BP 126/93 | HR 86 | Temp 99.2°F | Resp 18 | Wt 121.0 lb

## 2024-07-04 DIAGNOSIS — R0609 Other forms of dyspnea: Secondary | ICD-10-CM

## 2024-07-04 DIAGNOSIS — I479 Paroxysmal tachycardia, unspecified: Secondary | ICD-10-CM

## 2024-07-04 LAB — CBC WITH DIFFERENTIAL/PLATELET
Abs Immature Granulocytes: 0.02 K/uL (ref 0.00–0.07)
Basophils Absolute: 0 K/uL (ref 0.0–0.1)
Basophils Relative: 0 %
Eosinophils Absolute: 0.1 K/uL (ref 0.0–0.5)
Eosinophils Relative: 1 %
HCT: 40.1 % (ref 36.0–46.0)
Hemoglobin: 13.6 g/dL (ref 12.0–15.0)
Immature Granulocytes: 0 %
Lymphocytes Relative: 28 %
Lymphs Abs: 2 K/uL (ref 0.7–4.0)
MCH: 30.7 pg (ref 26.0–34.0)
MCHC: 33.9 g/dL (ref 30.0–36.0)
MCV: 90.5 fL (ref 80.0–100.0)
Monocytes Absolute: 0.6 K/uL (ref 0.1–1.0)
Monocytes Relative: 8 %
Neutro Abs: 4.4 K/uL (ref 1.7–7.7)
Neutrophils Relative %: 63 %
Platelets: 285 K/uL (ref 150–400)
RBC: 4.43 MIL/uL (ref 3.87–5.11)
RDW: 12.9 % (ref 11.5–15.5)
WBC: 7.1 K/uL (ref 4.0–10.5)
nRBC: 0 % (ref 0.0–0.2)

## 2024-07-04 LAB — COMPREHENSIVE METABOLIC PANEL WITH GFR
ALT: 62 U/L — ABNORMAL HIGH (ref 0–44)
AST: 44 U/L — ABNORMAL HIGH (ref 15–41)
Albumin: 4.2 g/dL (ref 3.5–5.0)
Alkaline Phosphatase: 51 U/L (ref 38–126)
Anion gap: 10 (ref 5–15)
BUN: 16 mg/dL (ref 6–20)
CO2: 25 mmol/L (ref 22–32)
Calcium: 8.8 mg/dL — ABNORMAL LOW (ref 8.9–10.3)
Chloride: 101 mmol/L (ref 98–111)
Creatinine, Ser: 0.87 mg/dL (ref 0.44–1.00)
GFR, Estimated: >60 mL/min (ref >60)
Glucose, Bld: 97 mg/dL (ref 70–99)
Potassium: 4.1 mmol/L (ref 3.5–5.1)
Sodium: 136 mmol/L (ref 135–145)
Total Bilirubin: 0.7 mg/dL (ref 0.0–1.2)
Total Protein: 7.2 g/dL (ref 6.5–8.1)

## 2024-07-04 NOTE — Discharge Instructions (Addendum)
 As we discussed, your CBC shows a normal red count and normal H&H.  No indication of anemia.  CMP shows normal electrolytes and renal function.  There was a mild elevation of your AST and ALT which may be a result of you resuming your iron supplementation or it may be an indication that your body is clearing the residual viral infection.  Given that your symptoms began after he returned from [place] it is possible that he contracted COVID even though you had a negative home test.  Follow-up with your primary care provider to have repeat iron studies.  If your iron studies are normal I would request a referral to pulmonology for further testing and management of your symptoms.

## 2024-07-04 NOTE — ED Provider Notes (Signed)
 [facility]    CSN: [id number] Arrival date & time: 07/04/24  1052      History   Chief Complaint Chief Complaint  Patient presents with   Fatigue    Fatigue with elevation in HR with normal activity (walking to kitchen goes to 100 range, when at baseline I stay in 80's), SOB with activity (not normal, I am a runner), restless leg, cough, dizziness--suspect anemia (ferritin OTC test low) - Entered by patient    HPI Autumn Martinez is a 48 y.o. female.   HPI  48 year old female with past medical history significant for IDA, hypertension, heart murmur, GERD, PTSD, PMDD, depression, and PVCs presents for evaluation of elevated heart rate with activity, shortness of breath with activity, restless leg, cough, and dizziness.  Patient suspects anemia.  Took over-the-counter ferritin test which was a low.  Past Medical History:  Diagnosis Date   Allergy    Anxiety    COVID-19 virus infection 08/08/2022   Depression    Family history of breast cancer 10/2021   cancer genetic testing letter sent   GERD (gastroesophageal reflux disease)    Heart murmur    Hypertension    IDA (iron deficiency anemia)     Patient Active Problem List   Diagnosis Date Noted   Personal history of COVID-19 03/27/2021   Depression, recurrent (HCC) 04/20/2020   Anxiety 04/20/2020   Cardiac murmur 04/20/2020   Prediabetes 09/25/2019   Iron deficiency anemia due to chronic blood loss 09/25/2019   Pyriformis syndrome, left 02/04/2019   Fear of flying 05/04/2018   GERD (gastroesophageal reflux disease) 02/17/2018   PMDD (premenstrual dysphoric disorder) 06/20/2016   PVC (premature ventricular contraction) 07/20/2015   Encounter for preventive health examination 10/25/2014   Pap smear for cervical cancer screening 10/25/2014   Food allergy 10/25/2014   Heart murmur, systolic 05/18/2013   Fatigue 92/91/7985   Menometrorrhagia 05/18/2013   PTSD (post-traumatic stress disorder)  05/18/2013   Essential hypertension 02/16/2009    Past Surgical History:  Procedure Laterality Date   BREAST BIOPSY Right 2018   benign   BREAST BIOPSY Left 04/22/2018    FIBROCYSTIC CHANGE WITH CALCIFICATIONS   CESAREAN SECTION  2002   CESAREAN SECTION  2007   CHOLECYSTECTOMY  2005   TONSILLECTOMY AND ADENOIDECTOMY Bilateral 1980   TUBAL LIGATION      OB History     Gravida  3   Para  2   Term  2   Preterm      AB  1   Living  2      SAB  1   IAB      Ectopic      Multiple      Live Births  2            Home Medications    Prior to Admission medications   Medication Sig Start Date End Date Taking? Authorizing Provider  amLODipine  (NORVASC ) 2.5 MG tablet Take 1 tablet (2.5 mg total) by mouth daily. 05/10/24 05/10/25 Yes Marylynn Verneita CROME, MD  citalopram  (CELEXA ) 10 MG tablet Take 1 tablet (10 mg total) by mouth daily. 01/15/24 01/14/25 Yes Marylynn Verneita CROME, MD  losartan  (COZAAR ) 100 MG tablet Take 1 tablet (100 mg total) by mouth daily. 05/10/24 05/10/25 Yes Marylynn Verneita CROME, MD  albuterol  (VENTOLIN  HFA) 108 (90 Base) MCG/ACT inhaler Inhale 2 puffs into the lungs every 6 (six) hours as needed for wheezing or shortness of breath. 05/10/24  Marylynn Verneita CROME, MD  ALPRAZolam  (XANAX ) 0.5 MG tablet Take 1 tablet (0.5 mg total) by mouth at bedtime as needed for anxiety. 05/10/24   Tullo, Teresa L, MD  amoxicillin -clavulanate (AUGMENTIN ) 875-125 MG tablet Take 1 tablet by mouth 2 (two) times daily. Patient not taking: Reported on 06/24/2024 06/14/24   Marylynn Verneita CROME, MD  buPROPion  (WELLBUTRIN  XL) 150 MG 24 hr tablet Take 1 tablet (150 mg total) by mouth daily. In addition to 300 mg ,  as needed 06/14/24   Tullo, Teresa L, MD  buPROPion  (WELLBUTRIN  XL) 300 MG 24 hr tablet Take 1 tablet (300 mg total) by mouth daily. 05/10/24   Marylynn Verneita CROME, MD  clindamycin -benzoyl peroxide  (BENZACLIN) gel Apply topically 2 (two) times daily. 04/06/24   Marylynn Verneita CROME, MD  Drospirenone  (SLYND ) 4  MG TABS Take 1 tablet (4 mg total) by mouth daily. 01/29/24     ferrous sulfate (EQL SLOW RELEASE IRON) 160 (50 Fe) MG TBCR SR tablet Take 160 mg by mouth daily.    [provider]  furosemide  (LASIX ) 20 MG tablet Take 1 tablet (20 mg total) by mouth daily. 05/10/24 05/10/25  Marylynn Verneita CROME, MD  gabapentin  (NEURONTIN ) 100 MG capsule Take 1 capsule (100 mg total) by mouth at bedtime. Patient taking differently: Take 100 mg by mouth as needed. 11/23/23   Marylynn Verneita CROME, MD  gabapentin  (NEURONTIN ) 100 MG capsule Take 1 capsule (100 mg total) by mouth at bedtime. 01/29/24     MIEBO 1.338 GM/ML SOLN  12/19/23   [provider]  Multiple Vitamins-Minerals (MULTIVITAMIN WITH MINERALS) tablet Take 1 tablet by mouth daily.    [provider]  omeprazole  (PRILOSEC) 40 MG capsule Take 1 capsule (40 mg total) by mouth daily. 12/08/23 12/07/24  Marylynn Verneita CROME, MD  ondansetron  (ZOFRAN -ODT) 4 MG disintegrating tablet Take 1 tablet (4 mg total) by mouth every 8 (eight) hours as needed for nausea or vomiting. 05/10/24   Marylynn Verneita CROME, MD    Family History Family History  Problem Relation Age of Onset   Hypertension Mother    Diabetes Mother 44   Lymphoma Mother 8   Hypertension Father    Brain cancer Father    Cancer Father    Cancer Maternal Aunt    Breast cancer Maternal Aunt 48   Mental retardation Maternal Uncle    Ovarian cancer Maternal Aunt 58   Endometrial cancer Maternal Aunt 58   Colon cancer Neg Hx    Esophageal cancer Neg Hx    Stomach cancer Neg Hx    Rectal cancer Neg Hx     Social History Social History   Tobacco Use   Smoking status: Never   Smokeless tobacco: Never  Vaping Use   Vaping status: Never Used  Substance Use Topics   Alcohol use: No   Drug use: No     Allergies   Butorphanol tartrate, Sulfonamide derivatives, and Hctz [hydrochlorothiazide]   Review of Systems Review of Systems  Constitutional:  Positive for fatigue.  HENT:   Negative for congestion, rhinorrhea and sore throat.   Respiratory:  Positive for cough and shortness of breath.        Nonproductive cough and shortness of breath with activity.  Cardiovascular:  Negative for chest pain and palpitations.       Tachycardia with activity  Musculoskeletal:  Positive for myalgias.       Intermittent cramping in legs.  Neurological:  Positive for dizziness. Negative for syncope.  Physical Exam Triage Vital Signs ED Triage Vitals  Encounter Vitals Group     BP      Girls Systolic BP Percentile      Girls Diastolic BP Percentile      Boys Systolic BP Percentile      Boys Diastolic BP Percentile      Pulse      Resp      Temp      Temp src      SpO2      Weight      Height      Head Circumference      Peak Flow      Pain Score      Pain Loc      Pain Education      Exclude from Growth Chart    No data found.  Updated Vital Signs BP (!) 126/93 (BP Location: Left Arm)   Pulse 86   Temp 99.2 F (37.3 C) (Oral)   Resp 18   Wt 121 lb (54.9 kg)   SpO2 100%   BMI 20.77 kg/m   Visual Acuity Right Eye Distance:   Left Eye Distance:   Bilateral Distance:    Right Eye Near:   Left Eye Near:    Bilateral Near:     Physical Exam Vitals and nursing note reviewed.  Constitutional:      Appearance: Normal appearance. She is not ill-appearing.  HENT:     Head: Normocephalic and atraumatic.  Cardiovascular:     Rate and Rhythm: Normal rate and regular rhythm.     Pulses: Normal pulses.     Heart sounds: Normal heart sounds. No murmur heard.    No friction rub. No gallop.  Pulmonary:     Effort: Pulmonary effort is normal.     Breath sounds: Normal breath sounds. No wheezing, rhonchi or rales.  Skin:    General: Skin is warm and dry.     Coloration: Skin is pale.  Neurological:     General: No focal deficit present.     Mental Status: She is alert and oriented to person, place, and time.      UC Treatments / Results   Labs (all labs ordered are listed, but only abnormal results are displayed) Labs Reviewed  COMPREHENSIVE METABOLIC PANEL WITH GFR - Abnormal; Notable for the following components:      Result Value   Calcium 8.8 (*)    AST 44 (*)    ALT 62 (*)    All other components within normal limits  CBC WITH DIFFERENTIAL/PLATELET    EKG Normal sinus rhythm with a ventricular rate of 81 bpm PR interval 146 ms QRS duration 88 ms QT/QTc 378/439 ms   Radiology No results found.  Procedures Procedures (including critical care time)  Medications Ordered in UC Medications - No data to display  Initial Impression / Assessment and Plan / UC Course  I have reviewed the triage vital signs and the nursing notes.  Pertinent labs & imaging results that were available during my care of the patient were reviewed by me and considered in my medical decision making (see chart for details).   Patient is a pleasant, nontoxic-appearing 48 year old female presenting for evaluation of cardiopulmonary symptoms that been going on since she returned from [place] on 06/12/2024.  At that time she developed a cough which is nonproductive.  She had a telehealth appointment and was treated with a round of Augmentin  and prednisone .  She was  then seen again in person at [facility] urgent care on 06/24/2024 and treated with a round of doxycycline .  She also had a chest x-ray at that time that that did not show any cardiopulmonary abnormality.  She does have a history of iron deficiency anemia and had been taking iron and B12 but had stopped.  She resumed her iron 3 days ago.  She did take a home ferritin test which was low.  Last iron panel available for review in epic was performed 09/22/2023 which showed a transferrin of 207 but ferritin was 30.5, TIBC 289.8, and saturation ratio was 33.1.  At that time her B12 was greater than 1537 but her folate was low at 5.2.    EKG here shows normal sinus rhythm with possible left atrial  enlargement.  This finding was also present on EKG dated 07/26/2021.  No abnormalities noted when comparing the 2 rhythms.    She is able to speak in full sentence about dyspnea or tachypnea.  Cardiopulmonary exam reveals S1-S2 heart sounds with regular rate and rhythm and lung sounds are clear to auscultation in all fields.  The patient symptoms could be explained by anemia.  I will order a CBC and CMP to evaluate for any blood dyscrasias or electrolyte abnormalities which could explain the cramping in her legs.  I have advised her that we are unable to order iron studies here at urgent care.  CBC shows normal red count of 4.43 and normal H&H of 13.6 and 40.1.  No abnormalities to the MCV, MCH, MCHC, or RDW.  Platelets are normal at 285.  No abnormalities to the differential.  CMP shows very mild hypocalcemia at 8.8 but electrolytes and renal functions are normal.  AST and ALT are both mildly elevated at 44 and 62 respectively.  Alk phos and T. bili are normal.  Elevation of the AST and ALT may be secondary to patient resuming her iron supplementation or residual from possible viral infection.  I discussed the findings with the patient and she reported that when she came back from [place] she had a negative home test for COVID.  I suggested that she follow-up with her PCP and to have her iron levels checked and if her iron levels are normal she may want to consider a referral to pulmonology as this her symptoms may be a post-COVID viral syndrome as the home COVID test have a 44% chance of being a false negative.  Patient verbalizes understanding of same.   Final Clinical Impressions(s) / UC Diagnoses   Final diagnoses:  Dyspnea on exertion  Tachycardia, paroxysmal (HCC)     Discharge Instructions      As we discussed, your CBC shows a normal red count and normal H&H.  No indication of anemia.  CMP shows normal electrolytes and renal function.  There was a mild elevation of your AST and ALT  which may be a result of you resuming your iron supplementation or it may be an indication that your body is clearing the residual viral infection.  Given that your symptoms began after he returned from [place] it is possible that he contracted COVID even though you had a negative home test.  Follow-up with your primary care provider to have repeat iron studies.  If your iron studies are normal I would request a referral to pulmonology for further testing and management of your symptoms.     ED Prescriptions   None    PDMP not reviewed this encounter.  Bernardino Ditch, NP 07/04/24 1200

## 2024-07-04 NOTE — ED Triage Notes (Signed)
 Patient states that this started after she traveled to [place]. Patient has had Doxy-Aug and prednisone  with no relief. Patient was told it could possibly be a PE.   Fatigue with elevation in HR with normal activity (walking to kitchen goes to 100 range, when at baseline I stay in 80's), SOB with activity (not normal, I am a runner), restless leg, cough, dizziness--suspect anemia (ferritin OTC test low) - Entered by patient

## 2024-07-11 MED FILL — Bupropion HCl Tab ER 24HR 150 MG: ORAL | 30 days supply | Qty: 30 | Fill #1 | Status: AC

## 2024-07-15 ENCOUNTER — Ambulatory Visit: Admitting: Nurse Practitioner

## 2024-08-06 ENCOUNTER — Other Ambulatory Visit (HOSPITAL_COMMUNITY): Payer: Self-pay

## 2024-08-10 ENCOUNTER — Other Ambulatory Visit: Payer: Self-pay | Admitting: Internal Medicine

## 2024-08-10 MED FILL — Bupropion HCl Tab ER 24HR 150 MG: ORAL | 30 days supply | Qty: 30 | Fill #2 | Status: AC

## 2024-08-11 ENCOUNTER — Other Ambulatory Visit: Payer: Self-pay

## 2024-08-12 ENCOUNTER — Other Ambulatory Visit: Payer: Self-pay

## 2024-08-12 MED ORDER — FUROSEMIDE 20 MG PO TABS
20.0000 mg | ORAL_TABLET | Freq: Every day | ORAL | 0 refills | Status: DC
  Filled 2024-08-12: qty 90, 90d supply, fill #0

## 2024-08-26 DIAGNOSIS — F4322 Adjustment disorder with anxiety: Secondary | ICD-10-CM | POA: Diagnosis not present

## 2024-08-30 ENCOUNTER — Other Ambulatory Visit: Payer: Self-pay | Admitting: Medical Genetics

## 2024-08-30 DIAGNOSIS — Z006 Encounter for examination for normal comparison and control in clinical research program: Secondary | ICD-10-CM

## 2024-09-07 ENCOUNTER — Other Ambulatory Visit: Payer: Self-pay

## 2024-09-07 ENCOUNTER — Other Ambulatory Visit: Payer: Self-pay | Admitting: Internal Medicine

## 2024-09-07 MED ORDER — FUROSEMIDE 20 MG PO TABS
20.0000 mg | ORAL_TABLET | Freq: Every day | ORAL | 0 refills | Status: DC
  Filled 2024-09-07 – 2024-11-08 (×2): qty 90, 90d supply, fill #0

## 2024-09-07 MED ORDER — BUPROPION HCL ER (XL) 150 MG PO TB24
150.0000 mg | ORAL_TABLET | Freq: Every day | ORAL | 2 refills | Status: DC
  Filled 2024-09-07 – 2024-09-17 (×2): qty 30, 30d supply, fill #0
  Filled 2024-10-22: qty 30, 30d supply, fill #1

## 2024-09-09 DIAGNOSIS — F4322 Adjustment disorder with anxiety: Secondary | ICD-10-CM | POA: Diagnosis not present

## 2024-09-17 ENCOUNTER — Other Ambulatory Visit: Payer: Self-pay

## 2024-09-23 ENCOUNTER — Ambulatory Visit: Admitting: Dermatology

## 2024-09-24 ENCOUNTER — Encounter: Admitting: Internal Medicine

## 2024-09-24 LAB — GENECONNECT MOLECULAR SCREEN: Genetic Analysis Overall Interpretation: NEGATIVE

## 2024-09-28 ENCOUNTER — Other Ambulatory Visit: Payer: Self-pay

## 2024-09-30 ENCOUNTER — Encounter: Payer: Self-pay | Admitting: Internal Medicine

## 2024-09-30 MED FILL — Citalopram Hydrobromide Tab 10 MG (Base Equiv): ORAL | 90 days supply | Qty: 90 | Fill #1 | Status: AC

## 2024-09-30 MED FILL — Omeprazole Cap Delayed Release 40 MG: ORAL | 90 days supply | Qty: 90 | Fill #1 | Status: AC

## 2024-09-30 MED FILL — Clindamycin Phosphate-Benzoyl Peroxide Gel 1-5%: CUTANEOUS | 30 days supply | Qty: 50 | Fill #1 | Status: AC

## 2024-10-04 ENCOUNTER — Other Ambulatory Visit: Payer: Self-pay

## 2024-10-04 MED ORDER — ONDANSETRON 4 MG PO TBDP
4.0000 mg | ORAL_TABLET | Freq: Three times a day (TID) | ORAL | 0 refills | Status: DC | PRN
  Filled 2024-10-04: qty 20, 7d supply, fill #0

## 2024-10-04 MED ORDER — ALPRAZOLAM 0.25 MG PO TABS
ORAL_TABLET | Freq: Two times a day (BID) | ORAL | 0 refills | Status: AC | PRN
  Filled 2024-10-04: qty 20, 10d supply, fill #0

## 2024-10-21 DIAGNOSIS — F4322 Adjustment disorder with anxiety: Secondary | ICD-10-CM | POA: Diagnosis not present

## 2024-10-28 ENCOUNTER — Other Ambulatory Visit: Payer: Self-pay

## 2024-11-03 ENCOUNTER — Other Ambulatory Visit: Payer: Self-pay | Admitting: Internal Medicine

## 2024-11-03 ENCOUNTER — Encounter: Payer: Self-pay | Admitting: Internal Medicine

## 2024-11-05 ENCOUNTER — Other Ambulatory Visit: Payer: Self-pay | Admitting: Internal Medicine

## 2024-11-05 ENCOUNTER — Other Ambulatory Visit: Payer: Self-pay

## 2024-11-05 NOTE — Telephone Encounter (Signed)
 LMTCB. Need to find out if pt requested a refill for the Zofran . If so is she having any symptoms that would require her to need this medication or this a medication that she keeps on hand for as needed use.

## 2024-11-06 ENCOUNTER — Telehealth: Admitting: Nurse Practitioner

## 2024-11-06 DIAGNOSIS — B9689 Other specified bacterial agents as the cause of diseases classified elsewhere: Secondary | ICD-10-CM

## 2024-11-06 DIAGNOSIS — J019 Acute sinusitis, unspecified: Secondary | ICD-10-CM

## 2024-11-06 MED ORDER — PREDNISONE 20 MG PO TABS: 20.0000 mg | ORAL_TABLET | Freq: Every day | ORAL | 0 refills | Status: DC

## 2024-11-06 MED ORDER — AMOXICILLIN-POT CLAVULANATE 875-125 MG PO TABS: 1.0000 | ORAL_TABLET | Freq: Two times a day (BID) | ORAL | 0 refills | Status: AC

## 2024-11-06 NOTE — Progress Notes (Signed)
"      E-Visit for Sinus Problems  We are sorry that you are not feeling well.  Here is how we plan to help!  Based on what you have shared with me it looks like you have sinusitis.  Sinusitis is inflammation and infection in the sinus cavities of the head.  Based on your presentation I believe you most likely have Acute Bacterial Sinusitis.  This is an infection caused by bacteria and is treated with antibiotics. I have prescribed Augmentin  875mg /125mg  one tablet twice daily with food, for 7 days. I have also prescribed 5 days of prednisone .  You may use an oral decongestant such as Mucinex  D or if you have glaucoma or high blood pressure use plain Mucinex . Saline nasal spray help and can safely be used as often as needed for congestion.  If you develop worsening sinus pain, fever or notice severe headache and vision changes, or if symptoms are not better after completion of antibiotic, please schedule an appointment with a health care provider.    Sinus infections are not as easily transmitted as other respiratory infection, however we still recommend that you avoid close contact with loved ones, especially the very young and elderly.  Remember to wash your hands thoroughly throughout the day as this is the number one way to prevent the spread of infection!  Home Care: Only take medications as instructed by your medical team. Complete the entire course of an antibiotic. Do not take these medications with alcohol. A steam or ultrasonic humidifier can help congestion.  You can place a towel over your head and breathe in the steam from hot water coming from a faucet. Avoid close contacts especially the very young and the elderly. Cover your mouth when you cough or sneeze. Always remember to wash your hands.  Get Help Right Away If: You develop worsening fever or sinus pain. You develop a severe head ache or visual changes. Your symptoms persist after you have completed your treatment plan.  Make  sure you Understand these instructions. Will watch your condition. Will get help right away if you are not doing well or get worse.  Your e-visit answers were reviewed by a board certified advanced clinical practitioner to complete your personal care plan.  Depending on the condition, your plan could have included both over the counter or prescription medications.  If there is a problem please reply  once you have received a response from your provider.  Your safety is important to us .  If you have drug allergies check your prescription carefully.    You can use MyChart to ask questions about todays visit, request a non-urgent call back, or ask for a work or school excuse for 24 hours related to this e-Visit. If it has been greater than 24 hours you will need to follow up with your provider, or enter a new e-Visit to address those concerns.  You will get an e-mail in the next two days asking about your experience.  I hope that your e-visit has been valuable and will speed your recovery. Thank you for using e-visits.  I have spent 5 minutes in review of e-visit questionnaire, review and updating patient chart, medical decision making and response to patient.   Haze LELON Servant, NP     "

## 2024-11-07 MED ORDER — PREDNISONE 10 MG PO TABS
ORAL_TABLET | ORAL | 0 refills | Status: AC
  Filled 2024-11-07 – 2024-11-10 (×5): qty 21, 6d supply, fill #0

## 2024-11-08 ENCOUNTER — Other Ambulatory Visit: Payer: Self-pay | Admitting: Internal Medicine

## 2024-11-08 ENCOUNTER — Other Ambulatory Visit: Payer: Self-pay

## 2024-11-08 MED ORDER — AMLODIPINE BESYLATE 2.5 MG PO TABS
2.5000 mg | ORAL_TABLET | Freq: Every day | ORAL | 3 refills | Status: AC
  Filled 2024-11-08: qty 90, 90d supply, fill #0

## 2024-11-08 MED FILL — Ondansetron Orally Disintegrating Tab 4 MG: ORAL | 7 days supply | Qty: 20 | Fill #0 | Status: AC

## 2024-11-09 ENCOUNTER — Other Ambulatory Visit: Payer: Self-pay

## 2024-11-09 ENCOUNTER — Encounter: Payer: Self-pay | Admitting: Internal Medicine

## 2024-11-10 ENCOUNTER — Other Ambulatory Visit: Payer: Self-pay

## 2024-11-21 ENCOUNTER — Other Ambulatory Visit: Payer: Self-pay

## 2024-11-23 ENCOUNTER — Other Ambulatory Visit: Payer: Self-pay

## 2024-11-23 ENCOUNTER — Encounter: Payer: Self-pay | Admitting: Internal Medicine

## 2024-11-23 MED ORDER — BUPROPION HCL ER (XL) 150 MG PO TB24
300.0000 mg | ORAL_TABLET | Freq: Every day | ORAL | 2 refills | Status: AC
  Filled 2024-11-23: qty 180, 90d supply, fill #0

## 2024-12-03 ENCOUNTER — Ambulatory Visit (INDEPENDENT_AMBULATORY_CARE_PROVIDER_SITE_OTHER): Admitting: Internal Medicine

## 2024-12-03 ENCOUNTER — Other Ambulatory Visit: Payer: Self-pay

## 2024-12-03 VITALS — BP 122/82 | HR 85 | Temp 98.0°F | Ht 64.0 in | Wt 131.4 lb

## 2024-12-03 DIAGNOSIS — F909 Attention-deficit hyperactivity disorder, unspecified type: Secondary | ICD-10-CM | POA: Diagnosis not present

## 2024-12-03 DIAGNOSIS — R7303 Prediabetes: Secondary | ICD-10-CM

## 2024-12-03 DIAGNOSIS — F840 Autistic disorder: Secondary | ICD-10-CM | POA: Diagnosis not present

## 2024-12-03 DIAGNOSIS — F339 Major depressive disorder, recurrent, unspecified: Secondary | ICD-10-CM

## 2024-12-03 DIAGNOSIS — E785 Hyperlipidemia, unspecified: Secondary | ICD-10-CM

## 2024-12-03 DIAGNOSIS — K219 Gastro-esophageal reflux disease without esophagitis: Secondary | ICD-10-CM | POA: Diagnosis not present

## 2024-12-03 DIAGNOSIS — I10 Essential (primary) hypertension: Secondary | ICD-10-CM

## 2024-12-03 DIAGNOSIS — F419 Anxiety disorder, unspecified: Secondary | ICD-10-CM

## 2024-12-03 DIAGNOSIS — Z124 Encounter for screening for malignant neoplasm of cervix: Secondary | ICD-10-CM

## 2024-12-03 DIAGNOSIS — Z1231 Encounter for screening mammogram for malignant neoplasm of breast: Secondary | ICD-10-CM

## 2024-12-03 DIAGNOSIS — Z0000 Encounter for general adult medical examination without abnormal findings: Secondary | ICD-10-CM | POA: Diagnosis not present

## 2024-12-03 DIAGNOSIS — R5383 Other fatigue: Secondary | ICD-10-CM

## 2024-12-03 DIAGNOSIS — D500 Iron deficiency anemia secondary to blood loss (chronic): Secondary | ICD-10-CM

## 2024-12-03 MED ORDER — FUROSEMIDE 20 MG PO TABS
20.0000 mg | ORAL_TABLET | Freq: Every day | ORAL | 0 refills | Status: AC
  Filled 2024-12-03: qty 90, 90d supply, fill #0

## 2024-12-03 MED ORDER — OMEPRAZOLE 40 MG PO CPDR
40.0000 mg | DELAYED_RELEASE_CAPSULE | Freq: Every day | ORAL | 1 refills | Status: AC
  Filled 2024-12-03: qty 90, 90d supply, fill #0

## 2024-12-03 NOTE — Progress Notes (Signed)
 Patient ID: Autumn Martinez, female    DOB: 11/19/1975  Age: 49 y.o. MRN: [id number]  The patient is here for annual preventive examination and management of other chronic and acute problems.   The risk factors are reflected in the social history.   The roster of all physicians providing medical care to patient - is listed in the Snapshot section of the chart.   Activities of daily living:  The patient is 100% independent in all ADLs: dressing, toileting, feeding as well as independent mobility   Home safety : The patient has smoke detectors in the home. They wear seatbelts.  There are no unsecured firearms at home. There is no violence in the home.    There is no risks for hepatitis, STDs or HIV. There is no   history of blood transfusion. They have no travel history to infectious disease endemic areas of the world.   The patient has seen their dentist in the last six month. They have seen their eye doctor in the last year. The patinet  denies slight hearing difficulty with regard to whispered voices and some television programs.  They have deferred audiologic testing in the last year.  They do not  have excessive sun exposure. Discussed the need for sun protection: hats, long sleeves and use of sunscreen if there is significant sun exposure.    Diet: the importance of a healthy diet is discussed. They do have a healthy diet.   The benefits of regular aerobic exercise were discussed. The patient  exercises  3 to 5 days per week  for  60 minutes.    Depression screen: there are no signs or vegative symptoms of depression- irritability, change in appetite, anhedonia, sadness/tearfullness.   The following portions of the patient's history were reviewed and updated as appropriate: allergies, current medications, past family history, past medical history,  past surgical history, past social history  and problem list.   Visual acuity was not assessed per patient preference since the patient has  regular follow up with an  ophthalmologist. Hearing and body mass index were assessed and reviewed.    During the course of the visit the patient was educated and counseled about appropriate screening and preventive services including : fall prevention , diabetes screening, nutrition counseling, colorectal cancer screening, and recommended immunizations.    Chief Complaint:   recently diagnosed with autism and ADHD  through [facility] ,   symptoms became worse during menopause,  she is now seeing a therapist through [facility] . Using wellbutrin  150 mg bid to manage attention issues  , feels it is effective   GYN this year for PAP smear [facility] Feb 5    Review of Symptoms  Patient denies headache, fevers, malaise, unintentional weight loss, skin rash, eye pain, sinus congestion and sinus pain, sore throat, dysphagia,  hemoptysis , cough, dyspnea, wheezing, chest pain, palpitations, orthopnea, edema, abdominal pain, nausea, melena, diarrhea, constipation, flank pain, dysuria, hematuria, urinary  Frequency, nocturia, numbness, tingling, seizures,  Focal weakness, Loss of consciousness,  Tremor, insomnia, depression, anxiety, and suicidal ideation.    Physical Exam:  BP 122/82 (BP Location: Left Arm, Patient Position: Sitting)   Pulse 85   Temp 98 F (36.7 C) (Oral)   Ht 5' 4 (1.626 m)   Wt 131 lb 6.4 oz (59.6 kg)   SpO2 98%   BMI 22.55 kg/m    Physical Exam Vitals reviewed.  Constitutional:      General:  She is not in acute distress.    Appearance: Normal appearance. She is normal weight. She is not ill-appearing, toxic-appearing or diaphoretic.  HENT:     Head: Normocephalic.  Eyes:     General: No scleral icterus.       Right eye: No discharge.        Left eye: No discharge.     Conjunctiva/sclera: Conjunctivae normal.  Cardiovascular:     Rate and Rhythm: Normal rate and regular rhythm.     Heart sounds: Normal heart sounds.  Pulmonary:      Effort: Pulmonary effort is normal. No respiratory distress.     Breath sounds: Normal breath sounds.  Musculoskeletal:        General: Normal range of motion.  Skin:    General: Skin is warm and dry.  Neurological:     General: No focal deficit present.     Mental Status: She is alert and oriented to person, place, and time. Mental status is at baseline.  Psychiatric:        Mood and Affect: Mood normal.        Behavior: Behavior normal.        Thought Content: Thought content normal.        Judgment: Judgment normal.     Assessment and Plan: Encounter for preventive health examination Assessment & Plan: age appropriate education and counseling updated, referrals for preventative services and immunizations addressed, dietary and smoking counseling addressed, most recent labs reviewed.  I have personally reviewed and have noted:   1) the patient's medical and social history 2) The pt's use of alcohol, tobacco, and illicit drugs 3) The patient's current medications and supplements 4) Functional ability including ADL's, fall risk, home safety risk, hearing and visual impairment 5) Diet and physical activities 6) Evidence for depression or mood disorder 7) The patient's height, weight, and BMI have been recorded in the chart   I have made referrals, and provided counseling and education based on review of the above    Gastroesophageal reflux disease without esophagitis -     Omeprazole ; Take 1 capsule (40 mg total) by mouth daily.  Dispense: 90 capsule; Refill: 1  Other fatigue -     TSH  Iron deficiency anemia due to chronic blood loss -     CBC with Differential/Platelet  Prediabetes -     Comprehensive metabolic panel with GFR -     Hemoglobin A1c  Essential hypertension Assessment & Plan:   BP has been well controlled on current regimen of amlodipine  and losartan  . Renal function  is normal, home readings have been checked regularly area are 120/70 or  no changes  today    Lab Results  Component Value Date   CREATININE 1.01 (H) 12/03/2024   Lab Results  Component Value Date   NA 140 12/03/2024   K 4.5 12/03/2024   CL 103 12/03/2024   CO2 23 12/03/2024     Orders: -     Comprehensive metabolic panel with GFR  Hyperlipidemia, unspecified hyperlipidemia type -     LDL cholesterol, direct -     Lipid panel  Encounter for screening mammogram for malignant neoplasm of breast -     3D Screening Mammogram, Left and Right; Future  Cervical cancer screening  Depression, recurrent Assessment & Plan: Symptoms are in remission with higher dose wellbutrin  and her concentration has improved.   SABRA Refills given today  for (2)  150 mg XL doses daily  Adult ADHD Assessment & Plan: Diagnosed by recent comprehensive evaluation during which ADHD and autism were diagnosed.    Autism spectrum Assessment & Plan: Diagnosed recently after her counsellor sent her for evaluation.  The diagnosis has relieved and validated her concerns about her sruggles since adolescence and goal directed therapy , nonpharmcologic , has begun    Anxiety Assessment & Plan: Continue daily citalopram .  Using, alprazolam  rarely .   Other orders -     Furosemide ; Take 1 tablet (20 mg total) by mouth daily.  Dispense: 90 tablet; Refill: 0    No follow-ups on file.  Verneita LITTIE Kettering, MD

## 2024-12-04 LAB — CBC WITH DIFFERENTIAL/PLATELET
Basophils Absolute: 0 10*3/uL (ref 0.0–0.2)
Basos: 1 %
EOS (ABSOLUTE): 0.1 10*3/uL (ref 0.0–0.4)
Eos: 1 %
Hematocrit: 41.6 % (ref 34.0–46.6)
Hemoglobin: 14 g/dL (ref 11.1–15.9)
Immature Grans (Abs): 0 10*3/uL (ref 0.0–0.1)
Immature Granulocytes: 0 %
Lymphocytes Absolute: 2.9 10*3/uL (ref 0.7–3.1)
Lymphs: 44 %
MCH: 31.3 pg (ref 26.6–33.0)
MCHC: 33.7 g/dL (ref 31.5–35.7)
MCV: 93 fL (ref 79–97)
Monocytes Absolute: 0.6 10*3/uL (ref 0.1–0.9)
Monocytes: 9 %
Neutrophils Absolute: 3 10*3/uL (ref 1.4–7.0)
Neutrophils: 45 %
Platelets: 290 10*3/uL (ref 150–450)
RBC: 4.47 x10E6/uL (ref 3.77–5.28)
RDW: 12.2 % (ref 11.7–15.4)
WBC: 6.7 10*3/uL (ref 3.4–10.8)

## 2024-12-04 LAB — TSH: TSH: 2.02 u[IU]/mL (ref 0.450–4.500)

## 2024-12-04 LAB — COMPREHENSIVE METABOLIC PANEL WITH GFR
ALT: 24 [IU]/L (ref 0–32)
AST: 28 [IU]/L (ref 0–40)
Albumin: 4.6 g/dL (ref 3.9–4.9)
Alkaline Phosphatase: 58 [IU]/L (ref 41–116)
BUN/Creatinine Ratio: 16 (ref 9–23)
BUN: 16 mg/dL (ref 6–24)
Bilirubin Total: 0.5 mg/dL (ref 0.0–1.2)
CO2: 23 mmol/L (ref 20–29)
Calcium: 9.3 mg/dL (ref 8.7–10.2)
Chloride: 103 mmol/L (ref 96–106)
Creatinine, Ser: 1.01 mg/dL — ABNORMAL HIGH (ref 0.57–1.00)
Globulin, Total: 2.5 g/dL (ref 1.5–4.5)
Glucose: 84 mg/dL (ref 70–99)
Potassium: 4.5 mmol/L (ref 3.5–5.2)
Sodium: 140 mmol/L (ref 134–144)
Total Protein: 7.1 g/dL (ref 6.0–8.5)
eGFR: 68 mL/min/{1.73_m2}

## 2024-12-04 LAB — LIPID PANEL
Chol/HDL Ratio: 3 ratio (ref 0.0–4.4)
Cholesterol, Total: 173 mg/dL (ref 100–199)
HDL: 57 mg/dL
LDL Chol Calc (NIH): 101 mg/dL — ABNORMAL HIGH (ref 0–99)
Triglycerides: 81 mg/dL (ref 0–149)
VLDL Cholesterol Cal: 15 mg/dL (ref 5–40)

## 2024-12-04 LAB — HEMOGLOBIN A1C
Est. average glucose Bld gHb Est-mCnc: 111 mg/dL
Hgb A1c MFr Bld: 5.5 % (ref 4.8–5.6)

## 2024-12-04 LAB — LDL CHOLESTEROL, DIRECT: LDL Direct: 112 mg/dL — ABNORMAL HIGH (ref 0–99)

## 2024-12-05 ENCOUNTER — Ambulatory Visit: Payer: Self-pay | Admitting: Internal Medicine

## 2024-12-05 DIAGNOSIS — F840 Autistic disorder: Secondary | ICD-10-CM | POA: Insufficient documentation

## 2024-12-05 DIAGNOSIS — F909 Attention-deficit hyperactivity disorder, unspecified type: Secondary | ICD-10-CM | POA: Insufficient documentation

## 2024-12-05 NOTE — Assessment & Plan Note (Signed)

## 2024-12-05 NOTE — Assessment & Plan Note (Signed)
 Diagnosed by recent comprehensive evaluation during which ADHD and autism were diagnosed.

## 2024-12-05 NOTE — Assessment & Plan Note (Signed)
 Continue daily citalopram .  Using, alprazolam  rarely .

## 2024-12-05 NOTE — Assessment & Plan Note (Signed)
 Symptoms are in remission with higher dose wellbutrin  and her concentration has improved.   SABRA Refills given today  for (2)  150 mg XL doses daily

## 2024-12-05 NOTE — Assessment & Plan Note (Signed)
 Diagnosed recently after her counsellor sent her for evaluation.  The diagnosis has relieved and validated her concerns about her sruggles since adolescence and goal directed therapy , nonpharmcologic , has begun

## 2024-12-05 NOTE — Assessment & Plan Note (Signed)
"    BP has been well controlled on current regimen of amlodipine  and losartan  . Renal function  is normal, home readings have been checked regularly area are 120/70 or  no changes today    Lab Results  Component Value Date   CREATININE 1.01 (H) 12/03/2024   Lab Results  Component Value Date   NA 140 12/03/2024   K 4.5 12/03/2024   CL 103 12/03/2024   CO2 23 12/03/2024    "

## 2024-12-09 ENCOUNTER — Telehealth: Payer: Self-pay | Admitting: Family Medicine

## 2024-12-09 MED ORDER — METHYLPREDNISOLONE 4 MG PO TBPK: ORAL_TABLET | ORAL | 0 refills | Status: AC

## 2024-12-09 MED ORDER — AZITHROMYCIN 250 MG PO TABS: ORAL_TABLET | ORAL | 0 refills | Status: AC

## 2024-12-09 NOTE — Telephone Encounter (Signed)
 Has been sick x6 days with cough and wheezing. Flu and covid negative. Will treat for bronchitis.

## 2024-12-17 ENCOUNTER — Other Ambulatory Visit: Payer: Self-pay

## 2024-12-17 MED ORDER — PROGESTERONE MICRONIZED 100 MG PO CAPS
100.0000 mg | ORAL_CAPSULE | Freq: Every day | ORAL | 0 refills | Status: AC
  Filled 2024-12-17 (×2): qty 90, 90d supply, fill #0

## 2024-12-17 MED ORDER — ESTRADIOL 0.025 MG/24HR TD PTTW
MEDICATED_PATCH | TRANSDERMAL | 0 refills | Status: AC
  Filled 2024-12-17: qty 24, 84d supply, fill #0

## 2025-01-20 ENCOUNTER — Ambulatory Visit: Admitting: Dermatology

## 2025-01-20 ENCOUNTER — Encounter: Admitting: Dermatology

## 2025-01-27 ENCOUNTER — Encounter

## 2025-01-27 DIAGNOSIS — Z1231 Encounter for screening mammogram for malignant neoplasm of breast: Secondary | ICD-10-CM

## 2025-12-09 ENCOUNTER — Encounter: Admitting: Internal Medicine
